# Patient Record
Sex: Male | Born: 1935
Health system: Southern US, Community
[De-identification: ages and names within clinical notes are randomized; demographics above are authoritative.]

## PROBLEM LIST (undated history)

## (undated) DIAGNOSIS — I517 Cardiomegaly: Secondary | ICD-10-CM

## (undated) DIAGNOSIS — I10 Essential (primary) hypertension: Secondary | ICD-10-CM

## (undated) DIAGNOSIS — N138 Other obstructive and reflux uropathy: Secondary | ICD-10-CM

## (undated) DIAGNOSIS — Z7902 Long term (current) use of antithrombotics/antiplatelets: Secondary | ICD-10-CM

## (undated) DIAGNOSIS — N401 Enlarged prostate with lower urinary tract symptoms: Secondary | ICD-10-CM

## (undated) DIAGNOSIS — M353 Polymyalgia rheumatica: Secondary | ICD-10-CM

## (undated) DIAGNOSIS — I82409 Acute embolism and thrombosis of unspecified deep veins of unspecified lower extremity: Secondary | ICD-10-CM

## (undated) DIAGNOSIS — I2510 Atherosclerotic heart disease of native coronary artery without angina pectoris: Secondary | ICD-10-CM

## (undated) DIAGNOSIS — N400 Enlarged prostate without lower urinary tract symptoms: Secondary | ICD-10-CM

## (undated) DIAGNOSIS — T8859XA Other complications of anesthesia, initial encounter: Secondary | ICD-10-CM

## (undated) DIAGNOSIS — K4090 Unilateral inguinal hernia, without obstruction or gangrene, not specified as recurrent: Secondary | ICD-10-CM

## (undated) DIAGNOSIS — D510 Vitamin B12 deficiency anemia due to intrinsic factor deficiency: Secondary | ICD-10-CM

## (undated) DIAGNOSIS — I255 Ischemic cardiomyopathy: Secondary | ICD-10-CM

## (undated) DIAGNOSIS — M1990 Unspecified osteoarthritis, unspecified site: Secondary | ICD-10-CM

## (undated) DIAGNOSIS — I5020 Unspecified systolic (congestive) heart failure: Secondary | ICD-10-CM

## (undated) DIAGNOSIS — I452 Bifascicular block: Secondary | ICD-10-CM

## (undated) DIAGNOSIS — E785 Hyperlipidemia, unspecified: Secondary | ICD-10-CM

## (undated) DIAGNOSIS — R310 Gross hematuria: Secondary | ICD-10-CM

## (undated) DIAGNOSIS — T4145XA Adverse effect of unspecified anesthetic, initial encounter: Secondary | ICD-10-CM

## (undated) DIAGNOSIS — T884XXA Failed or difficult intubation, initial encounter: Secondary | ICD-10-CM

## (undated) DIAGNOSIS — I700 Atherosclerosis of aorta: Secondary | ICD-10-CM

## (undated) HISTORY — DX: Acute embolism and thrombosis of unspecified deep veins of unspecified lower extremity: I82.409

## (undated) HISTORY — DX: Vitamin B12 deficiency anemia due to intrinsic factor deficiency: D51.0

## (undated) HISTORY — DX: Essential (primary) hypertension: I10

## (undated) HISTORY — PX: CORONARY ANGIOPLASTY WITH STENT PLACEMENT: SHX49

## (undated) HISTORY — DX: Atherosclerotic heart disease of native coronary artery without angina pectoris: I25.10

## (undated) HISTORY — DX: Unspecified systolic (congestive) heart failure: I50.20

## (undated) HISTORY — DX: Unspecified osteoarthritis, unspecified site: M19.90

## (undated) HISTORY — DX: Ischemic cardiomyopathy: I25.5

## (undated) HISTORY — DX: Benign prostatic hyperplasia without lower urinary tract symptoms: N40.0

## (undated) HISTORY — PX: JOINT REPLACEMENT: SHX530

---

## 1999-11-16 ENCOUNTER — Encounter (INDEPENDENT_AMBULATORY_CARE_PROVIDER_SITE_OTHER): Payer: Self-pay | Admitting: Internal Medicine

## 1999-11-16 LAB — CONVERTED CEMR LAB: PSA: 1.3 ng/mL

## 2003-01-09 ENCOUNTER — Encounter: Admission: RE | Admit: 2003-01-09 | Discharge: 2003-01-09 | Payer: Self-pay | Admitting: Internal Medicine

## 2003-01-09 ENCOUNTER — Encounter: Payer: Self-pay | Admitting: Internal Medicine

## 2003-01-21 LAB — FECAL OCCULT BLOOD, GUAIAC: Fecal Occult Blood: NEGATIVE

## 2003-08-03 HISTORY — PX: FRACTURE SURGERY: SHX138

## 2004-06-23 ENCOUNTER — Ambulatory Visit: Payer: Self-pay | Admitting: Internal Medicine

## 2004-07-08 ENCOUNTER — Inpatient Hospital Stay (HOSPITAL_COMMUNITY): Admission: AC | Admit: 2004-07-08 | Discharge: 2004-07-18 | Payer: Self-pay

## 2004-07-08 ENCOUNTER — Ambulatory Visit: Payer: Self-pay | Admitting: Physical Medicine & Rehabilitation

## 2004-07-22 ENCOUNTER — Encounter: Admission: RE | Admit: 2004-07-22 | Discharge: 2004-10-20 | Payer: Self-pay | Admitting: General Surgery

## 2004-07-24 ENCOUNTER — Emergency Department (HOSPITAL_COMMUNITY): Admission: EM | Admit: 2004-07-24 | Discharge: 2004-07-24 | Payer: Self-pay | Admitting: Emergency Medicine

## 2004-07-29 ENCOUNTER — Encounter: Admission: RE | Admit: 2004-07-29 | Discharge: 2004-07-29 | Payer: Self-pay | Admitting: Specialist

## 2004-08-05 ENCOUNTER — Ambulatory Visit: Payer: Self-pay | Admitting: Internal Medicine

## 2004-08-10 ENCOUNTER — Ambulatory Visit (HOSPITAL_COMMUNITY): Admission: RE | Admit: 2004-08-10 | Discharge: 2004-08-10 | Payer: Self-pay | Admitting: Specialist

## 2004-08-14 ENCOUNTER — Ambulatory Visit: Payer: Self-pay | Admitting: Internal Medicine

## 2004-08-17 ENCOUNTER — Ambulatory Visit: Payer: Self-pay | Admitting: Internal Medicine

## 2004-08-24 ENCOUNTER — Ambulatory Visit: Payer: Self-pay | Admitting: Internal Medicine

## 2004-08-26 ENCOUNTER — Ambulatory Visit: Payer: Self-pay | Admitting: Internal Medicine

## 2004-08-31 ENCOUNTER — Ambulatory Visit: Payer: Self-pay | Admitting: Internal Medicine

## 2004-09-02 ENCOUNTER — Ambulatory Visit: Payer: Self-pay | Admitting: Family Medicine

## 2004-09-08 ENCOUNTER — Ambulatory Visit: Payer: Self-pay | Admitting: Internal Medicine

## 2004-10-07 ENCOUNTER — Ambulatory Visit: Payer: Self-pay | Admitting: Internal Medicine

## 2004-11-03 ENCOUNTER — Ambulatory Visit: Payer: Self-pay | Admitting: Internal Medicine

## 2004-12-01 ENCOUNTER — Ambulatory Visit: Payer: Self-pay | Admitting: Internal Medicine

## 2005-01-11 ENCOUNTER — Ambulatory Visit: Payer: Self-pay | Admitting: Internal Medicine

## 2005-02-11 ENCOUNTER — Ambulatory Visit: Payer: Self-pay | Admitting: Family Medicine

## 2005-03-17 ENCOUNTER — Ambulatory Visit: Payer: Self-pay | Admitting: Internal Medicine

## 2005-04-20 ENCOUNTER — Ambulatory Visit: Payer: Self-pay | Admitting: Family Medicine

## 2005-05-21 ENCOUNTER — Ambulatory Visit: Payer: Self-pay | Admitting: Family Medicine

## 2005-06-09 ENCOUNTER — Ambulatory Visit: Payer: Self-pay | Admitting: Internal Medicine

## 2005-06-22 ENCOUNTER — Ambulatory Visit: Payer: Self-pay | Admitting: Family Medicine

## 2005-07-23 ENCOUNTER — Ambulatory Visit: Payer: Self-pay | Admitting: Family Medicine

## 2005-08-24 ENCOUNTER — Ambulatory Visit: Payer: Self-pay | Admitting: Internal Medicine

## 2005-09-22 ENCOUNTER — Encounter (INDEPENDENT_AMBULATORY_CARE_PROVIDER_SITE_OTHER): Payer: Self-pay | Admitting: *Deleted

## 2005-09-22 ENCOUNTER — Ambulatory Visit (HOSPITAL_BASED_OUTPATIENT_CLINIC_OR_DEPARTMENT_OTHER): Admission: RE | Admit: 2005-09-22 | Discharge: 2005-09-23 | Payer: Self-pay | Admitting: Orthopedic Surgery

## 2005-09-28 ENCOUNTER — Ambulatory Visit: Payer: Self-pay | Admitting: Family Medicine

## 2005-10-28 ENCOUNTER — Ambulatory Visit: Payer: Self-pay | Admitting: Family Medicine

## 2005-11-30 ENCOUNTER — Ambulatory Visit: Payer: Self-pay | Admitting: Family Medicine

## 2006-01-06 ENCOUNTER — Ambulatory Visit: Payer: Self-pay | Admitting: Family Medicine

## 2006-02-07 ENCOUNTER — Ambulatory Visit: Payer: Self-pay | Admitting: Family Medicine

## 2006-03-11 ENCOUNTER — Ambulatory Visit: Payer: Self-pay | Admitting: Family Medicine

## 2006-04-22 ENCOUNTER — Ambulatory Visit: Payer: Self-pay | Admitting: Internal Medicine

## 2006-04-27 ENCOUNTER — Ambulatory Visit: Payer: Self-pay | Admitting: Internal Medicine

## 2006-05-24 ENCOUNTER — Ambulatory Visit: Payer: Self-pay | Admitting: Internal Medicine

## 2006-06-08 ENCOUNTER — Ambulatory Visit: Payer: Self-pay | Admitting: Family Medicine

## 2006-07-20 ENCOUNTER — Ambulatory Visit: Payer: Self-pay | Admitting: Internal Medicine

## 2006-08-24 ENCOUNTER — Ambulatory Visit: Payer: Self-pay | Admitting: Internal Medicine

## 2006-08-30 ENCOUNTER — Ambulatory Visit: Payer: Self-pay

## 2006-09-02 HISTORY — PX: TOTAL KNEE ARTHROPLASTY: SHX125

## 2006-09-20 ENCOUNTER — Inpatient Hospital Stay (HOSPITAL_COMMUNITY): Admission: RE | Admit: 2006-09-20 | Discharge: 2006-09-26 | Payer: Self-pay | Admitting: Specialist

## 2006-09-21 ENCOUNTER — Ambulatory Visit: Payer: Self-pay | Admitting: Physical Medicine & Rehabilitation

## 2006-10-04 ENCOUNTER — Ambulatory Visit: Payer: Self-pay | Admitting: Internal Medicine

## 2006-11-02 ENCOUNTER — Encounter (INDEPENDENT_AMBULATORY_CARE_PROVIDER_SITE_OTHER): Payer: Self-pay | Admitting: Internal Medicine

## 2006-11-02 DIAGNOSIS — I10 Essential (primary) hypertension: Secondary | ICD-10-CM | POA: Insufficient documentation

## 2006-11-02 DIAGNOSIS — Z86718 Personal history of other venous thrombosis and embolism: Secondary | ICD-10-CM | POA: Insufficient documentation

## 2006-11-02 DIAGNOSIS — M159 Polyosteoarthritis, unspecified: Secondary | ICD-10-CM | POA: Insufficient documentation

## 2006-11-02 DIAGNOSIS — D510 Vitamin B12 deficiency anemia due to intrinsic factor deficiency: Secondary | ICD-10-CM

## 2006-11-16 ENCOUNTER — Ambulatory Visit: Payer: Self-pay | Admitting: Family Medicine

## 2006-12-10 ENCOUNTER — Inpatient Hospital Stay (HOSPITAL_COMMUNITY): Admission: EM | Admit: 2006-12-10 | Discharge: 2006-12-14 | Payer: Self-pay | Admitting: Emergency Medicine

## 2006-12-10 ENCOUNTER — Encounter: Payer: Self-pay | Admitting: Internal Medicine

## 2006-12-19 ENCOUNTER — Ambulatory Visit: Payer: Self-pay | Admitting: Internal Medicine

## 2007-01-03 ENCOUNTER — Encounter (INDEPENDENT_AMBULATORY_CARE_PROVIDER_SITE_OTHER): Payer: Self-pay | Admitting: *Deleted

## 2007-01-04 ENCOUNTER — Ambulatory Visit: Payer: Self-pay | Admitting: Internal Medicine

## 2007-01-04 DIAGNOSIS — H9190 Unspecified hearing loss, unspecified ear: Secondary | ICD-10-CM | POA: Insufficient documentation

## 2007-01-19 ENCOUNTER — Ambulatory Visit: Payer: Self-pay | Admitting: Internal Medicine

## 2007-02-20 ENCOUNTER — Ambulatory Visit: Payer: Self-pay | Admitting: Internal Medicine

## 2007-04-13 ENCOUNTER — Ambulatory Visit: Payer: Self-pay | Admitting: Internal Medicine

## 2007-06-02 ENCOUNTER — Ambulatory Visit: Payer: Self-pay | Admitting: Internal Medicine

## 2007-07-03 ENCOUNTER — Ambulatory Visit: Payer: Self-pay | Admitting: Internal Medicine

## 2007-07-11 ENCOUNTER — Ambulatory Visit: Payer: Self-pay | Admitting: Internal Medicine

## 2007-07-11 DIAGNOSIS — N401 Enlarged prostate with lower urinary tract symptoms: Secondary | ICD-10-CM

## 2007-07-11 DIAGNOSIS — N138 Other obstructive and reflux uropathy: Secondary | ICD-10-CM

## 2007-07-12 LAB — CONVERTED CEMR LAB
Basophils Absolute: 0 10*3/uL (ref 0.0–0.1)
Calcium: 9.1 mg/dL (ref 8.4–10.5)
Chloride: 100 meq/L (ref 96–112)
GFR calc Af Amer: 123 mL/min
GFR calc non Af Amer: 102 mL/min
Glucose, Bld: 94 mg/dL (ref 70–99)
HCT: 38.2 % — ABNORMAL LOW (ref 39.0–52.0)
MCHC: 33.8 g/dL (ref 30.0–36.0)
Neutrophils Relative %: 68.5 % (ref 43.0–77.0)
RBC: 4.34 M/uL (ref 4.22–5.81)
RDW: 14.2 % (ref 11.5–14.6)
TSH: 1.51 microintl units/mL (ref 0.35–5.50)
WBC: 6.2 10*3/uL (ref 4.5–10.5)

## 2007-07-25 ENCOUNTER — Ambulatory Visit: Payer: Self-pay | Admitting: Gastroenterology

## 2007-08-07 ENCOUNTER — Ambulatory Visit: Payer: Self-pay | Admitting: Internal Medicine

## 2007-08-08 ENCOUNTER — Encounter: Payer: Self-pay | Admitting: Internal Medicine

## 2007-08-08 ENCOUNTER — Ambulatory Visit: Payer: Self-pay | Admitting: Gastroenterology

## 2007-08-16 ENCOUNTER — Ambulatory Visit: Payer: Self-pay | Admitting: Internal Medicine

## 2007-09-03 HISTORY — PX: TOTAL KNEE ARTHROPLASTY: SHX125

## 2007-09-07 ENCOUNTER — Ambulatory Visit: Payer: Self-pay | Admitting: Family Medicine

## 2007-09-12 ENCOUNTER — Ambulatory Visit: Payer: Self-pay | Admitting: Internal Medicine

## 2007-09-13 LAB — CONVERTED CEMR LAB: PSA: 2.47 ng/mL (ref 0.10–4.00)

## 2007-09-22 ENCOUNTER — Inpatient Hospital Stay (HOSPITAL_COMMUNITY): Admission: RE | Admit: 2007-09-22 | Discharge: 2007-09-27 | Payer: Self-pay | Admitting: Specialist

## 2007-09-25 ENCOUNTER — Ambulatory Visit: Payer: Self-pay | Admitting: Physical Medicine & Rehabilitation

## 2007-09-26 ENCOUNTER — Encounter (INDEPENDENT_AMBULATORY_CARE_PROVIDER_SITE_OTHER): Payer: Self-pay | Admitting: Specialist

## 2007-09-26 ENCOUNTER — Ambulatory Visit: Payer: Self-pay | Admitting: Surgery

## 2007-10-04 ENCOUNTER — Emergency Department (HOSPITAL_COMMUNITY): Admission: EM | Admit: 2007-10-04 | Discharge: 2007-10-04 | Payer: Self-pay | Admitting: Emergency Medicine

## 2007-10-05 ENCOUNTER — Telehealth (INDEPENDENT_AMBULATORY_CARE_PROVIDER_SITE_OTHER): Payer: Self-pay | Admitting: *Deleted

## 2007-10-09 ENCOUNTER — Ambulatory Visit: Payer: Self-pay | Admitting: Internal Medicine

## 2007-10-11 ENCOUNTER — Ambulatory Visit: Payer: Self-pay | Admitting: Internal Medicine

## 2007-10-12 ENCOUNTER — Telehealth: Payer: Self-pay | Admitting: Internal Medicine

## 2007-10-17 ENCOUNTER — Ambulatory Visit: Payer: Self-pay | Admitting: Internal Medicine

## 2007-10-18 ENCOUNTER — Telehealth (INDEPENDENT_AMBULATORY_CARE_PROVIDER_SITE_OTHER): Payer: Self-pay | Admitting: *Deleted

## 2007-10-24 ENCOUNTER — Ambulatory Visit: Payer: Self-pay | Admitting: Internal Medicine

## 2007-11-09 ENCOUNTER — Ambulatory Visit: Payer: Self-pay | Admitting: Internal Medicine

## 2007-11-29 ENCOUNTER — Encounter: Payer: Self-pay | Admitting: Internal Medicine

## 2007-12-12 ENCOUNTER — Ambulatory Visit: Payer: Self-pay | Admitting: Internal Medicine

## 2008-01-10 ENCOUNTER — Ambulatory Visit: Payer: Self-pay | Admitting: Internal Medicine

## 2008-01-17 ENCOUNTER — Ambulatory Visit: Payer: Self-pay | Admitting: Internal Medicine

## 2008-02-12 ENCOUNTER — Ambulatory Visit (HOSPITAL_COMMUNITY): Admission: RE | Admit: 2008-02-12 | Discharge: 2008-02-12 | Payer: Self-pay | Admitting: Specialist

## 2008-02-20 ENCOUNTER — Ambulatory Visit: Payer: Self-pay | Admitting: Internal Medicine

## 2008-03-22 ENCOUNTER — Ambulatory Visit: Payer: Self-pay | Admitting: Internal Medicine

## 2008-04-23 ENCOUNTER — Ambulatory Visit: Payer: Self-pay | Admitting: Family Medicine

## 2008-04-29 ENCOUNTER — Ambulatory Visit: Payer: Self-pay | Admitting: Family Medicine

## 2008-05-24 ENCOUNTER — Ambulatory Visit: Payer: Self-pay | Admitting: Internal Medicine

## 2008-06-24 ENCOUNTER — Ambulatory Visit: Payer: Self-pay | Admitting: Internal Medicine

## 2008-07-12 ENCOUNTER — Ambulatory Visit: Payer: Self-pay | Admitting: Internal Medicine

## 2008-07-16 LAB — CONVERTED CEMR LAB
Basophils Absolute: 0 10*3/uL (ref 0.0–0.1)
CO2: 30 meq/L (ref 19–32)
GFR calc non Af Amer: 118 mL/min
Glucose, Bld: 76 mg/dL (ref 70–99)
HCT: 36.5 % — ABNORMAL LOW (ref 39.0–52.0)
Hemoglobin: 12.4 g/dL — ABNORMAL LOW (ref 13.0–17.0)
MCHC: 34.1 g/dL (ref 30.0–36.0)
MCV: 86.4 fL (ref 78.0–100.0)
Monocytes Absolute: 0.4 10*3/uL (ref 0.1–1.0)
Neutro Abs: 2.7 10*3/uL (ref 1.4–7.7)
Phosphorus: 2.8 mg/dL (ref 2.3–4.6)
Potassium: 4.3 meq/L (ref 3.5–5.1)
RDW: 14.1 % (ref 11.5–14.6)
Sodium: 138 meq/L (ref 135–145)
TSH: 2.37 microintl units/mL (ref 0.35–5.50)
Total Bilirubin: 0.5 mg/dL (ref 0.3–1.2)

## 2008-07-25 ENCOUNTER — Ambulatory Visit: Payer: Self-pay | Admitting: Internal Medicine

## 2008-08-19 ENCOUNTER — Telehealth: Payer: Self-pay | Admitting: Internal Medicine

## 2008-08-30 ENCOUNTER — Ambulatory Visit: Payer: Self-pay | Admitting: Internal Medicine

## 2008-09-12 ENCOUNTER — Encounter (INDEPENDENT_AMBULATORY_CARE_PROVIDER_SITE_OTHER): Payer: Self-pay | Admitting: *Deleted

## 2008-09-30 ENCOUNTER — Ambulatory Visit: Payer: Self-pay | Admitting: Internal Medicine

## 2008-10-31 ENCOUNTER — Ambulatory Visit: Payer: Self-pay | Admitting: Internal Medicine

## 2008-12-03 ENCOUNTER — Ambulatory Visit: Payer: Self-pay | Admitting: Internal Medicine

## 2009-01-03 ENCOUNTER — Ambulatory Visit: Payer: Self-pay | Admitting: Internal Medicine

## 2009-01-16 ENCOUNTER — Ambulatory Visit: Payer: Self-pay | Admitting: Internal Medicine

## 2009-02-04 ENCOUNTER — Ambulatory Visit: Payer: Self-pay | Admitting: Internal Medicine

## 2009-03-07 ENCOUNTER — Ambulatory Visit: Payer: Self-pay | Admitting: Internal Medicine

## 2009-03-07 ENCOUNTER — Telehealth: Payer: Self-pay | Admitting: Internal Medicine

## 2009-03-07 LAB — CONVERTED CEMR LAB
AST: 22 units/L (ref 0–37)
Alkaline Phosphatase: 99 units/L (ref 39–117)
Total Bilirubin: 0.5 mg/dL (ref 0.3–1.2)

## 2009-04-08 ENCOUNTER — Ambulatory Visit: Payer: Self-pay | Admitting: Internal Medicine

## 2009-05-09 ENCOUNTER — Ambulatory Visit: Payer: Self-pay | Admitting: Internal Medicine

## 2009-06-10 ENCOUNTER — Ambulatory Visit: Payer: Self-pay | Admitting: Internal Medicine

## 2009-07-10 ENCOUNTER — Ambulatory Visit: Payer: Self-pay | Admitting: Internal Medicine

## 2009-07-12 ENCOUNTER — Encounter: Payer: Self-pay | Admitting: Internal Medicine

## 2009-07-12 ENCOUNTER — Emergency Department (HOSPITAL_COMMUNITY): Admission: EM | Admit: 2009-07-12 | Discharge: 2009-07-12 | Payer: Self-pay | Admitting: Emergency Medicine

## 2009-07-16 ENCOUNTER — Ambulatory Visit: Payer: Self-pay | Admitting: Internal Medicine

## 2009-07-18 LAB — CONVERTED CEMR LAB
ALT: 41 units/L (ref 0–53)
AST: 50 units/L — ABNORMAL HIGH (ref 0–37)
Basophils Relative: 0.6 % (ref 0.0–3.0)
CO2: 29 meq/L (ref 19–32)
Calcium: 9.3 mg/dL (ref 8.4–10.5)
Chloride: 100 meq/L (ref 96–112)
Creatinine, Ser: 0.7 mg/dL (ref 0.4–1.5)
Eosinophils Relative: 3.1 % (ref 0.0–5.0)
HCT: 38.2 % — ABNORMAL LOW (ref 39.0–52.0)
Hemoglobin: 12.8 g/dL — ABNORMAL LOW (ref 13.0–17.0)
Lymphs Abs: 2 10*3/uL (ref 0.7–4.0)
MCV: 90.6 fL (ref 78.0–100.0)
Monocytes Absolute: 0.8 10*3/uL (ref 0.1–1.0)
Monocytes Relative: 9.2 % (ref 3.0–12.0)
Neutro Abs: 5.6 10*3/uL (ref 1.4–7.7)
Platelets: 194 10*3/uL (ref 150.0–400.0)
Sodium: 136 meq/L (ref 135–145)
TSH: 1.29 microintl units/mL (ref 0.35–5.50)
Total Bilirubin: 0.8 mg/dL (ref 0.3–1.2)
Total Protein: 7.1 g/dL (ref 6.0–8.3)
WBC: 8.8 10*3/uL (ref 4.5–10.5)

## 2009-08-14 ENCOUNTER — Ambulatory Visit: Payer: Self-pay | Admitting: Internal Medicine

## 2009-09-16 ENCOUNTER — Ambulatory Visit: Payer: Self-pay | Admitting: Internal Medicine

## 2009-10-14 ENCOUNTER — Ambulatory Visit: Payer: Self-pay | Admitting: Internal Medicine

## 2009-11-14 ENCOUNTER — Ambulatory Visit: Payer: Self-pay | Admitting: Internal Medicine

## 2009-12-16 ENCOUNTER — Ambulatory Visit: Payer: Self-pay | Admitting: Internal Medicine

## 2010-01-16 ENCOUNTER — Ambulatory Visit: Payer: Self-pay | Admitting: Internal Medicine

## 2010-01-19 ENCOUNTER — Ambulatory Visit: Payer: Self-pay | Admitting: Internal Medicine

## 2010-02-17 ENCOUNTER — Ambulatory Visit: Payer: Self-pay | Admitting: Internal Medicine

## 2010-02-27 ENCOUNTER — Encounter: Payer: Self-pay | Admitting: Internal Medicine

## 2010-03-20 ENCOUNTER — Telehealth: Payer: Self-pay | Admitting: Internal Medicine

## 2010-03-20 ENCOUNTER — Ambulatory Visit: Payer: Self-pay | Admitting: Internal Medicine

## 2010-04-20 ENCOUNTER — Ambulatory Visit: Payer: Self-pay | Admitting: Internal Medicine

## 2010-05-20 ENCOUNTER — Ambulatory Visit: Payer: Self-pay | Admitting: Internal Medicine

## 2010-06-23 ENCOUNTER — Ambulatory Visit: Payer: Self-pay | Admitting: Internal Medicine

## 2010-07-20 ENCOUNTER — Ambulatory Visit: Payer: Self-pay | Admitting: Internal Medicine

## 2010-07-21 LAB — CONVERTED CEMR LAB
ALT: 17 units/L (ref 0–53)
Alkaline Phosphatase: 93 units/L (ref 39–117)
Basophils Relative: 0.6 % (ref 0.0–3.0)
Bilirubin, Direct: 0.1 mg/dL (ref 0.0–0.3)
Creatinine, Ser: 0.8 mg/dL (ref 0.4–1.5)
Eosinophils Absolute: 0.2 10*3/uL (ref 0.0–0.7)
Eosinophils Relative: 2.6 % (ref 0.0–5.0)
Glucose, Bld: 91 mg/dL (ref 70–99)
Lymphocytes Relative: 24.6 % (ref 12.0–46.0)
Monocytes Absolute: 0.5 10*3/uL (ref 0.1–1.0)
Neutrophils Relative %: 65 % (ref 43.0–77.0)
Phosphorus: 2.4 mg/dL (ref 2.3–4.6)
Platelets: 189 10*3/uL (ref 150.0–400.0)
Potassium: 4.1 meq/L (ref 3.5–5.1)
RBC: 4.36 M/uL (ref 4.22–5.81)
Sodium: 141 meq/L (ref 135–145)
Total Bilirubin: 0.4 mg/dL (ref 0.3–1.2)
Total Protein: 6.7 g/dL (ref 6.0–8.3)
WBC: 6.2 10*3/uL (ref 4.5–10.5)

## 2010-07-23 ENCOUNTER — Ambulatory Visit: Payer: Self-pay | Admitting: Internal Medicine

## 2010-08-25 ENCOUNTER — Ambulatory Visit
Admission: RE | Admit: 2010-08-25 | Discharge: 2010-08-25 | Payer: Self-pay | Source: Home / Self Care | Attending: Internal Medicine | Admitting: Internal Medicine

## 2010-08-30 LAB — CONVERTED CEMR LAB
Albumin: 3.9 g/dL (ref 3.5–5.2)
BUN: 17 mg/dL (ref 6–23)
Basophils Absolute: 0.1 10*3/uL (ref 0.0–0.1)
Calcium: 9.3 mg/dL (ref 8.4–10.5)
Chloride: 103 meq/L (ref 96–112)
GFR calc Af Amer: 123 mL/min
GFR calc non Af Amer: 102 mL/min
MCHC: 35.2 g/dL (ref 30.0–36.0)
Monocytes Relative: 6.7 % (ref 3.0–11.0)
Platelets: 199 10*3/uL (ref 150–400)
RBC: 4.55 M/uL (ref 4.22–5.81)
RDW: 13.1 % (ref 11.5–14.6)
Sodium: 138 meq/L (ref 135–145)

## 2010-09-03 NOTE — Assessment & Plan Note (Signed)
Summary: B12/Dionel Archey/DLO   Nurse Visit   Allergies: No Known Drug Allergies  Medication Administration  Injection # 1:    Medication: Vit B12 1000 mcg    Diagnosis: ANEMIA, PERNICIOUS (ICD-281.0)    Route: IM    Site: L deltoid    Exp Date: 01/31/2012    Lot #: [id number]    Mfr: [employer] Regent    Patient tolerated injection without complications    Given by: Mervin Hack CMA [facility] Dull) (July 23, 2010 8:45 AM)  Orders Added: 1)  Vit B12 1000 mcg [J3420] 2)  Admin of Therapeutic Inj  intramuscular or subcutaneous [96372]   Medication Administration  Injection # 1:    Medication: Vit B12 1000 mcg    Diagnosis: ANEMIA, PERNICIOUS (ICD-281.0)    Route: IM    Site: L deltoid    Exp Date: 01/31/2012    Lot #: [id number]    Mfr: [employer] Regent    Patient tolerated injection without complications    Given by: Mervin Hack CMA [facility] Dull) (July 23, 2010 8:45 AM)  Orders Added: 1)  Vit B12 1000 mcg [J3420] 2)  Admin of Therapeutic Inj  intramuscular or subcutaneous [16109]

## 2010-09-03 NOTE — Assessment & Plan Note (Signed)
Summary: B12/DLO   Nurse Visit   Allergies: No Known Drug Allergies  Medication Administration  Injection # 1:    Medication: Vit B12 1000 mcg    Diagnosis: ANEMIA, PERNICIOUS (ICD-281.0)    Route: IM    Site: R deltoid    Exp Date: 01/31/2012    Lot #: [id number]    Mfr: [employer] Regent    Patient tolerated injection without complications    Given by: Benny Lennert CMA [facility] Dull) (June 23, 2010 8:37 AM)  Orders Added: 1)  Vit B12 1000 mcg [J3420] 2)  Admin of Therapeutic Inj  intramuscular or subcutaneous [16109]

## 2010-09-03 NOTE — Letter (Signed)
Summary: Letter Regarding Refills by Jacques Navy MD  Letter Regarding Refills by Jacques Navy MD   Imported By: Lanelle Bal 03/25/2010 12:46:21  _____________________________________________________________________  External Attachment:    Type:   Image     Comment:   External Document

## 2010-09-03 NOTE — Assessment & Plan Note (Signed)
Summary: B12/DLO   Nurse Visit     Allergies: No Known Drug Allergies     Medication Administration  Injection # 1:    Medication: Vit B12 1000 mcg    Diagnosis: ANEMIA, PERNICIOUS (ICD-281.0)    Route: IM    Site: R deltoid    Exp Date: 05/12/2009    Lot #: [id number]    Mfr: American Regent    Patient tolerated injection without complications    Given by: Mervin Hack CMA (February 04, 2009 10:05 AM)  Orders Added: 1)  Vit B12 1000 mcg [J3420] 2)  Admin of Therapeutic Inj  intramuscular or subcutaneous [96372]        Medication Administration  Injection # 1:    Medication: Vit B12 1000 mcg    Diagnosis: ANEMIA, PERNICIOUS (ICD-281.0)    Route: IM    Site: R deltoid    Exp Date: 05/12/2009    Lot #: [id number]    Mfr: [employer] Regent    Patient tolerated injection without complications    Given by: Mervin Hack CMA (February 04, 2009 10:05 AM)  Orders Added: 1)  Vit B12 1000 mcg [J3420] 2)  Admin of Therapeutic Inj  intramuscular or subcutaneous [[id number]]

## 2010-09-03 NOTE — Assessment & Plan Note (Signed)
Summary: [name] B12/RBH   Nurse Visit   Allergies: No Known Drug Allergies  Medication Administration  Injection # 1:    Medication: Vit B12 1000 mcg    Diagnosis: ANEMIA, PERNICIOUS (ICD-281.0)    Route: IM    Site: R deltoid    Exp Date: 12/01/2010    Lot #: [id number]    Mfr: American Regent    Patient tolerated injection without complications    Given by: Mervin Hack CMA (AAMA) (August 14, 2009 10:52 AM)  Orders Added: 1)  Vit B12 1000 mcg [J3420] 2)  Admin of Therapeutic Inj  intramuscular or subcutaneous [96372]   Medication Administration  Injection # 1:    Medication: Vit B12 1000 mcg    Diagnosis: ANEMIA, PERNICIOUS (ICD-281.0)    Route: IM    Site: R deltoid    Exp Date: 12/01/2010    Lot #: [id number]    Mfr: [employer] Regent    Patient tolerated injection without complications    Given by: Mervin Hack CMA (AAMA) (August 14, 2009 10:52 AM)  Orders Added: 1)  Vit B12 1000 mcg [J3420] 2)  Admin of Therapeutic Inj  intramuscular or subcutaneous [[id number]]

## 2010-09-03 NOTE — Assessment & Plan Note (Signed)
Summary: b12/dlo   Nurse Visit   Allergies: No Known Drug Allergies  Medication Administration  Injection # 1:    Medication: Vit B12 1000 mcg    Diagnosis: ANEMIA, PERNICIOUS (ICD-281.0)    Route: IM    Site: L deltoid    Exp Date: 08/02/2011    Lot #: [id number]    Mfr: [employer] Regent    Patient tolerated injection without complications    Given by: Delilah Shan CMA (AAMA) (January 16, 2010 10:07 AM)  Orders Added: 1)  Admin of Therapeutic Inj  intramuscular or subcutaneous [96372] 2)  Vit B12 1000 mcg [J3420]

## 2010-09-03 NOTE — Assessment & Plan Note (Signed)
Summary: B12 SHOT / LFW   Nurse Visit   Allergies: No Known Drug Allergies  Medication Administration  Injection # 1:    Medication: Vit B12 1000 mcg    Diagnosis: ANEMIA, PERNICIOUS (ICD-281.0)    Route: IM    Site: L deltoid    Exp Date: 12/01/2010    Lot #: [id number]    Mfr: [employer] Regent    Patient tolerated injection without complications    Given by: Mervin Hack CMA [facility]) (November 14, 2009 10:16 AM)  Orders Added: 1)  Vit B12 1000 mcg [J3420] 2)  Admin of Therapeutic Inj  intramuscular or subcutaneous [96372]   Medication Administration  Injection # 1:    Medication: Vit B12 1000 mcg    Diagnosis: ANEMIA, PERNICIOUS (ICD-281.0)    Route: IM    Site: L deltoid    Exp Date: 12/01/2010    Lot #: [id number]    Mfr: [employer] Regent    Patient tolerated injection without complications    Given by: Mervin Hack CMA (AAMA) (November 14, 2009 10:16 AM)  Orders Added: 1)  Vit B12 1000 mcg [J3420] 2)  Admin of Therapeutic Inj  intramuscular or subcutaneous [[id number]]

## 2010-09-03 NOTE — Assessment & Plan Note (Signed)
Summary: B12/DLO   Nurse Visit   Allergies: No Known Drug Allergies  Medication Administration  Injection # 1:    Medication: Vit B12 1000 mcg    Diagnosis: ANEMIA, PERNICIOUS (ICD-281.0)    Route: IM    Site: R deltoid    Exp Date: 05/02/2012    Lot #: [id number]    Mfr: [employer] Regent    Patient tolerated injection without complications    Given by: Sydell Axon LPN (August 25, 2010 9:23 AM)  Orders Added: 1)  Vit B12 1000 mcg [J3420] 2)  Admin of Therapeutic Inj  intramuscular or subcutaneous [96372]   Medication Administration  Injection # 1:    Medication: Vit B12 1000 mcg    Diagnosis: ANEMIA, PERNICIOUS (ICD-281.0)    Route: IM    Site: R deltoid    Exp Date: 05/02/2012    Lot #: [id number]    Mfr: [employer] Regent    Patient tolerated injection without complications    Given by: Sydell Axon LPN (August 25, 2010 9:23 AM)  Orders Added: 1)  Vit B12 1000 mcg [J3420] 2)  Admin of Therapeutic Inj  intramuscular or subcutaneous [16109]
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
## 2010-09-03 NOTE — Assessment & Plan Note (Signed)
Summary: B12/DLO   Nurse Visit   Allergies: No Known Drug Allergies  Medication Administration  Injection # 1:    Medication: Vit B12 1000 mcg    Diagnosis: ANEMIA, PERNICIOUS (ICD-281.0)    Route: IM    Site: L deltoid    Exp Date: 05/03/2011    Lot #: [id number]    Mfr: American Regent    Patient tolerated injection without complications    Given by: Linde Gillis CMA [facility] Dull) (September 16, 2009 10:17 AM)  Orders Added: 1)  Vit B12 1000 mcg [J3420] 2)  Admin of Therapeutic Inj  intramuscular or subcutaneous [96045]

## 2010-09-03 NOTE — Assessment & Plan Note (Signed)
Summary: B-12/LETVAK/JRR   Nurse Visit   Allergies: No Known Drug Allergies  Medication Administration  Injection # 1:    Medication: Vit B12 1000 mcg    Diagnosis: ANEMIA, PERNICIOUS (ICD-281.0)    Route: IM    Site: L deltoid    Exp Date: 11/30/2011    Lot #: [id number]    Mfr: American Regent    Patient tolerated injection without complications    Given by: Delilah Shan CMA [facility] Dull) (May 20, 2010 8:46 AM)  Orders Added: 1)  Admin of Therapeutic Inj  intramuscular or subcutaneous [96372] 2)  Vit B12 1000 mcg [J3420]   Medication Administration  Injection # 1:    Medication: Vit B12 1000 mcg    Diagnosis: ANEMIA, PERNICIOUS (ICD-281.0)    Route: IM    Site: L deltoid    Exp Date: 11/30/2011    Lot #: [id number]    Mfr: American Regent    Patient tolerated injection without complications    Given by: Delilah Shan CMA (AAMA) (May 20, 2010 8:46 AM)  Orders Added: 1)  Admin of Therapeutic Inj  intramuscular or subcutaneous [96372] 2)  Vit B12 1000 mcg [J3420]
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
## 2010-09-03 NOTE — Assessment & Plan Note (Signed)
Summary: b12/dlo   Nurse Visit   Allergies: No Known Drug Allergies  Medication Administration  Injection # 1:    Medication: Vit B12 1000 mcg    Diagnosis: ANEMIA, PERNICIOUS (ICD-281.0)    Route: IM    Site: R deltoid    Exp Date: 06/03/2011    Lot #: [id number]    Mfr: American Regent    Patient tolerated injection without complications    Given by: Linde Gillis CMA (AAMA) (October 14, 2009 10:08 AM)  Orders Added: 1)  Vit B12 1000 mcg [J3420] 2)  Admin of Therapeutic Inj  intramuscular or subcutaneous [96045]

## 2010-09-03 NOTE — Assessment & Plan Note (Signed)
Summary: B12/DLO   Nurse Visit   Allergies: No Known Drug Allergies  Medication Administration  Injection # 1:    Medication: Vit B12 1000 mcg    Diagnosis: ANEMIA, PERNICIOUS (ICD-281.0)    Route: IM    Site: R deltoid    Exp Date: 06/03/2011    Lot #: [id number]    Mfr: American Regent    Patient tolerated injection without complications    Given by: Linde Gillis CMA Duncan Dull) (Dec 16, 2009 11:08 AM)  Orders Added: 1)  Vit B12 1000 mcg [J3420] 2)  Admin of Therapeutic Inj  intramuscular or subcutaneous [96372]  Prior Medications: CARDURA 4 MG TABS (DOXAZOSIN MESYLATE) Take 1 tablet by mouth once a day LISINOPRIL 10 MG TABS (LISINOPRIL) Take 1 tablet by mouth once a day OXYBUTYNIN CHLORIDE 5 MG TABS (OXYBUTYNIN CHLORIDE) Take 1 tablet by mouth two times a day CYANOCOBALAMIN 1000 MCG/ML SOLN (CYANOCOBALAMIN) monthly MULTIVITAMINS  TABS (MULTIPLE VITAMIN) Take 1 tablet by mouth once a day BAYER LOW STRENGTH 81 MG  TBEC (ASPIRIN) 1 daily by mouth DICLOFENAC SODIUM 75 MG TBEC (DICLOFENAC SODIUM) 1 tab two times a day for arthritis HYDROCODONE-ACETAMINOPHEN 5-500 MG TABS (HYDROCODONE-ACETAMINOPHEN) 1-2 tabs at bedtime for arthritis pain AMOXICILLIN-POT CLAVULANATE 875-125 MG TABS (AMOXICILLIN-POT CLAVULANATE) take 1 by mouth two times a day SULFAMETHOXAZOLE-TMP DS 800-160 MG TABS (SULFAMETHOXAZOLE-TRIMETHOPRIM) take 1 by mouth two times a day x10days OXYCODONE-ACETAMINOPHEN 5-325 MG TABS (OXYCODONE-ACETAMINOPHEN) take 1-2 by mouth every 4-6hours as needed pain Current Allergies: No known allergies

## 2010-09-03 NOTE — Assessment & Plan Note (Signed)
Summary: LETVAK B12/RBH   Nurse Visit   Allergies: No Known Drug Allergies  Medication Administration  Injection # 1:    Medication: Vit B12 1000 mcg    Diagnosis: ANEMIA, PERNICIOUS (ICD-281.0)    Route: IM    Site: R deltoid    Exp Date: 07/03/2011    Lot #: [id number]    Mfr: American Regent    Patient tolerated injection without complications    Given by: Sydell Axon LPN (March 20, 2010 10:02 AM)  Orders Added: 1)  Vit B12 1000 mcg [J3420] 2)  Admin of Therapeutic Inj  intramuscular or subcutaneous [96372]     Medication Administration  Injection # 1:    Medication: Vit B12 1000 mcg    Diagnosis: ANEMIA, PERNICIOUS (ICD-281.0)    Route: IM    Site: R deltoid    Exp Date: 07/03/2011    Lot #: [id number]    Mfr: American Regent    Patient tolerated injection without complications    Given by: Sydell Axon LPN (March 20, 2010 10:02 AM)  Orders Added: 1)  Vit B12 1000 mcg [J3420] 2)  Admin of Therapeutic Inj  intramuscular or subcutaneous [19147]

## 2010-09-03 NOTE — Progress Notes (Signed)
Summary: Letter and refills  Phone Note Call from Patient   Caller: Patient Call For: Cindee Salt MD Summary of Call: Patient came to office for B-12 injection. Patient dropped a letter from Dr. Otelia Sergeant that he would like Dr. Alphonsus Sias to read. Copy made and put in your in box. Patient is having a problem with [facility] and they will not refill his meds until a bill is paid which patient does not feel that he should owe. Patient would like rx's sent to [facility] for a 30 day supply of Oxybutynin, Lisinopril and Doxazosin with refills because he will have to pay for them until he gets this cleared up with [facility]. Patient stated that he has been out of his medicaion for 3 months because of this issue. Initial call taken by: Sydell Axon LPN,  March 20, 2010 10:07 AM  Follow-up for Phone Call        okay to send refills on those meds for 1 month x 3 refills  I reviewed the letter Follow-up by: Cindee Salt MD,  March 20, 2010 1:05 PM  Additional Follow-up for Phone Call Additional follow up Details #1::        refills sent to pharmacy, also advised pt's wife about refills. Additional Follow-up by: Mervin Hack CMA Duncan Dull),  March 20, 2010 2:48 PM    Prescriptions: CARDURA 4 MG TABS (DOXAZOSIN MESYLATE) Take 1 tablet by mouth once a day  #30 x 3   Entered by:   Mervin Hack CMA (AAMA)   Authorized by:   Cindee Salt MD   Signed by:   Mervin Hack CMA (AAMA) on 03/20/2010   Method used:   Electronically to        [facility] [address] [phone]* (retail)       [address] [place], [place]  [id number]       Ph: [phone]       Fax: [phone]   RxID:   [id number] LISINOPRIL 10 MG TABS (LISINOPRIL) Take 1 tablet by mouth once a day  #30 x 3   Entered by:   Mervin Hack CMA (AAMA)   Authorized by:   Cindee Salt MD   Signed by:   Mervin Hack CMA (AAMA) on 03/20/2010   Method used:   Electronically to        [facility] [address]  [phone]* (retail)       [address] [place], [place]  [id number]       Ph: [phone]       Fax: [phone]   RxID:   [id number] OXYBUTYNIN CHLORIDE 5 MG TABS (OXYBUTYNIN CHLORIDE) Take 1 tablet by mouth two times a day  #60 x 3   Entered by:   Mervin Hack CMA (AAMA)   Authorized by:   Cindee Salt MD   Signed by:   Mervin Hack CMA (AAMA) on 03/20/2010   Method used:   Electronically to        [facility] [address] [phone]* (retail)       [address] [place], [place]  [id number]       Ph: [phone]       Fax: [phone]   RxID:   [id number]

## 2010-09-03 NOTE — Assessment & Plan Note (Signed)
Summary: B-12   Nurse Visit   Allergies: No Known Drug Allergies  Medication Administration  Injection # 1:    Medication: Vit B12 1000 mcg    Diagnosis: ANEMIA, PERNICIOUS (ICD-281.0)    Route: IM    Site: L deltoid    Exp Date: 07/2011    Lot #: [id number]    Mfr: American Regent    Patient tolerated injection without complications    Given by: Lowella Petties CMA (February 17, 2010 1:15 PM)  Orders Added: 1)  Vit B12 1000 mcg [J3420] 2)  Admin of Therapeutic Inj  intramuscular or subcutaneous [96372]   Medication Administration  Injection # 1:    Medication: Vit B12 1000 mcg    Diagnosis: ANEMIA, PERNICIOUS (ICD-281.0)    Route: IM    Site: L deltoid    Exp Date: 07/2011    Lot #: [id number]    Mfr: American Regent    Patient tolerated injection without complications    Given by: Lowella Petties CMA (February 17, 2010 1:15 PM)  Orders Added: 1)  Vit B12 1000 mcg [J3420] 2)  Admin of Therapeutic Inj  intramuscular or subcutaneous [54098]

## 2010-09-25 ENCOUNTER — Encounter: Payer: Self-pay | Admitting: Internal Medicine

## 2010-09-25 ENCOUNTER — Ambulatory Visit (INDEPENDENT_AMBULATORY_CARE_PROVIDER_SITE_OTHER): Payer: Medicare Other

## 2010-09-25 DIAGNOSIS — D510 Vitamin B12 deficiency anemia due to intrinsic factor deficiency: Secondary | ICD-10-CM

## 2010-09-29 NOTE — Assessment & Plan Note (Signed)
Summary: b/12 jrt   Nurse Visit   Allergies: No Known Drug Allergies  Medication Administration  Injection # 1:    Medication: Vit B12 1000 mcg    Diagnosis: ANEMIA, PERNICIOUS (ICD-281.0)    Route: IM    Site: L deltoid    Exp Date: 03/01/2012    Lot #: [id number]    Mfr: American Regent    Patient tolerated injection without complications    Given by: Delilah Shan CMA (AAMA) (September 25, 2010 9:15 AM)  Orders Added: 1)  Admin of Therapeutic Inj  intramuscular or subcutaneous [96372] 2)  Vit B12 1000 mcg [J3420]   Medication Administration  Injection # 1:    Medication: Vit B12 1000 mcg    Diagnosis: ANEMIA, PERNICIOUS (ICD-281.0)    Route: IM    Site: L deltoid    Exp Date: 03/01/2012    Lot #: [id number]    Mfr: American Regent    Patient tolerated injection without complications    Given by: Delilah Shan CMA (AAMA) (September 25, 2010 9:15 AM)  Orders Added: 1)  Admin of Therapeutic Inj  intramuscular or subcutaneous [96372] 2)  Vit B12 1000 mcg [J3420]

## 2010-10-27 ENCOUNTER — Ambulatory Visit (INDEPENDENT_AMBULATORY_CARE_PROVIDER_SITE_OTHER): Payer: Medicare Other | Admitting: Internal Medicine

## 2010-10-27 DIAGNOSIS — D649 Anemia, unspecified: Secondary | ICD-10-CM

## 2010-10-27 MED ORDER — CYANOCOBALAMIN 1000 MCG/ML IJ SOLN
1000.0000 ug | INTRAMUSCULAR | Status: AC
  Administered 2010-10-27 – 2011-04-02 (×5): 1000 ug via INTRAMUSCULAR

## 2010-10-27 NOTE — Progress Notes (Signed)
  Subjective:    Patient ID: [name], male    DOB: 14-Mar-1935, 76 y.o.   MRN: [id number]  HPI Here for B12 injection   Review of Systems     Objective:   Physical Exam        Assessment & Plan:

## 2010-11-02 ENCOUNTER — Other Ambulatory Visit: Payer: Self-pay | Admitting: Internal Medicine

## 2010-11-24 ENCOUNTER — Ambulatory Visit: Payer: Medicare Other

## 2010-11-27 ENCOUNTER — Ambulatory Visit (INDEPENDENT_AMBULATORY_CARE_PROVIDER_SITE_OTHER): Payer: Medicare Other | Admitting: Family Medicine

## 2010-11-27 DIAGNOSIS — D510 Vitamin B12 deficiency anemia due to intrinsic factor deficiency: Secondary | ICD-10-CM

## 2010-11-27 DIAGNOSIS — D649 Anemia, unspecified: Secondary | ICD-10-CM

## 2010-11-27 NOTE — Progress Notes (Signed)
  Subjective:    Patient ID: [name], male    DOB: 11-04-1934, 76 y.o.   MRN: [id number]  HPI H/o pernicious anemia, here for B12 injection.   Review of Systems     Objective:   Physical Exam        Assessment & Plan:

## 2010-12-02 ENCOUNTER — Other Ambulatory Visit: Payer: Self-pay | Admitting: Internal Medicine

## 2010-12-04 ENCOUNTER — Encounter: Payer: Self-pay | Admitting: Internal Medicine

## 2010-12-04 ENCOUNTER — Ambulatory Visit (INDEPENDENT_AMBULATORY_CARE_PROVIDER_SITE_OTHER): Payer: Medicare Other | Admitting: Internal Medicine

## 2010-12-04 VITALS — BP 127/55 | HR 64 | Temp 98.4°F | Ht 72.0 in | Wt 201.8 lb

## 2010-12-04 DIAGNOSIS — W57XXXA Bitten or stung by nonvenomous insect and other nonvenomous arthropods, initial encounter: Secondary | ICD-10-CM

## 2010-12-04 DIAGNOSIS — T148 Other injury of unspecified body region: Secondary | ICD-10-CM
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
## 2010-12-04 NOTE — Patient Instructions (Addendum)
Please call if there is more redness around the tick site
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
## 2010-12-15 NOTE — Discharge Summary (Signed)
NAMEGRESHAM, [name]                ACCOUNT NO.:  [id number]   MEDICAL RECORD NO.:  [id number]          PATIENT TYPE:  INP   LOCATION:  5031                         FACILITY:  [facility]   PHYSICIAN:  Kerrin Champagne, M.D.   DATE OF BIRTH:  1935-04-11   DATE OF ADMISSION:  09/20/2006  DATE OF DISCHARGE:  09/26/2006                               DISCHARGE SUMMARY   ADMISSION DIAGNOSES:  1. Severe tricompartmental osteoarthritis right knee with flexion      deformity and varus deformity.  2. Hypertension.  3. Iron deficiency anemia.  4. BPH.  5. History of right lower extremity deep venous thrombosis.  6. History of gout.   DISCHARGE DIAGNOSES:  1. Severe tricompartmental osteoarthritis right knee with flexion      deformity and varus deformity.  2. Hypertension.  3. Iron deficiency anemia.  4. BPH.  5. History of right lower extremity deep venous thrombosis.  6. History of gout.  7. Post-hemorrhagic anemia.  8. Post-operative fever, resolved at discharge.   PROCEDURE:  Right cemented total knee arthroplasty, computer-assisted,  performed by Dr. Otelia Sergeant, assisted by Maud Deed [facility] under general  anesthesia.   CONSULTATIONS:  Physical medicine and rehabilitation, Dr. Hermelinda Medicus.   BRIEF HISTORY:  The patient is a 76 year old male with history of severe  osteoarthritis of the right knee.  He has failed conservative management  including viscous supplementation, physical therapy, intra-articular  steroid injections.  He is now requiring anti-inflammatories and  narcotic medications for his relief.  His right knee was noted to have  severe varus deformity, as well as a flexion contracture.  Radiographs  with severe tricompartmental osteoarthritis.  It was felt he would  benefit from total knee arthroplasty and was admitted for admitted for  this procedure.   BRIEF HOSPITAL COURSE:  The patient tolerated the procedure under  general anesthesia without complications.  Postoperatively,  he was  started on physical therapy for range of motion strengthening,  exercises, and gait training.  He was allowed weightbearing as tolerated  on the operative extremity.  The patient utilizes CPM machine, as well  as active range of motion.  He progressed fairly well throughout the  hospital stay.  Rehab consult was obtained, bit it was felt he was not a  necessary candidate, as he did have good home support and was  progressing well with physical therapy.  At the time of discharge, the  patient was ambulating greater than 200 feet and had range of motion  with full extension and flexion to approximately 90 degrees.  Occupational therapy assisted with ADLs, and the patient tolerated this  well.  The patient was placed on Coumadin for DVT prophylaxis.  Adjustments in Coumadin dose were made, according to daily pro-times by  the pharmacist.  The patient did have events of low grade fever on the  third and fourth and fifth postoperative days.  Eventually, he was  afebrile.  Urinalysis showed no urinary tract infection, and chest x-ray  with no acute change.  The examination of his knee showed him to have a  mild effusion, however,  stable appearance.  No obvious signs of  infection.  The patient eventually was afebrile for a full 24 hours and  at that time was felt to be stable for discharge home.  Daily dressing  changes revealed the wound to be healing well without erythema, edema or  drainage.   PERTINENT LABORATORY VALUES:  Admission hemoglobin and hematocrit 13.4  and 39.7 respectively.  At discharge, hemoglobin 9.4 and hematocrit  27.2.  At discharge, INR 1.8.  Chemistry studies on admission within  normal limits, with the exception of BUN 24.  Repeat during the hospital  stay showed stable BMET.  One episode of hyponatremia at 132.  Urinalysis negative for urinary tract infection on September 23, 2006.   Chest x-ray on admission with chronic changes, no acute chest finding  and  repeat on February22, stable chronic changes with no acute chest  disease.   PLAN:  The patient was discharged to his home with arrangements for home  health physical therapy, occupational therapy, durable medical equipment  and Coumadin management.  He was encouraged to do active and passive  range of motion of the knee.  He will continue to be weightbearing as  tolerated on the operative extremity.  He may change his dressing on as  needed basis, and his wound will be allowed to be wet.  He was given a  prescription for hydrocodone 5/325, one to two every 4 to 6 hours as  needed for pain and Coumadin 5 mg 2 tablets daily at 6:00 p.m.  He was  advised to follow up with Dr. Otelia Sergeant two weeks from the date of surgery.  All questions encouraged and answered prior to discharge.   CONDITION ON DISCHARGE:  Stable.      Wende Neighbors, P.A.      Kerrin Champagne, M.D.  Electronically Signed    SMV/MEDQ  D:  06/01/2007  T:  06/02/2007  Job:  [id number]

## 2010-12-15 NOTE — Discharge Summary (Signed)
[name], [name]                ACCOUNT NO.:  [id number]   MEDICAL RECORD NO.:  [id number]          PATIENT TYPE:  INP   LOCATION:  5018                         FACILITY:  [facility]   PHYSICIAN:  Kerrin Champagne, M.D.   DATE OF BIRTH:  1935-06-13   DATE OF ADMISSION:  09/22/2007  DATE OF DISCHARGE:  09/27/2007                               DISCHARGE SUMMARY   ADMISSION DIAGNOSES:  1. Tricompartmental osteoarthritis of the left knee.  2. Status post right total knee arthroplasty in February 2007.  3. History of deep venous thrombosis.  4. Iron deficiency anemia.  5. Hypertension.  6. Benign prostatic hypertrophy.  7. History of gout.   DISCHARGE DIAGNOSES:  1. Tricompartmental osteoarthritis of the left knee.  2. Status post right total knee arthroplasty February of 2007.  3. History of deep venous thrombosis.  4. Iron deficiency anemia.  5. Hypertension.  6. Benign prostatic hypertrophy.  7. History of gout.  8. Posthemorrhagic anemia.  9. Postoperative diarrhea, resolved at discharge.  10.Postoperative hyponatremia and hypokalemia, stable at discharge.   PROCEDURE:  On September 22, 2007, the patient underwent left total knee  arthroplasty, computer assisted, performed by Dr. Otelia Sergeant assisted by  Maud Deed, PA-C, under general anesthesia.   CONSULTATIONS:  Dr. Riley Kill of Physical Medicine and Rehabilitation.   BRIEF HISTORY:  The patient is a 76 year old male, who is status post  right total knee arthroplasty approximately 2 years prior to this  admission.  He has had persistent pain and dysfunction of the left knee.  He now has restriction of his functions and ability to stand and walk  secondary to the left knee pain.  Radiographs have indicated  tricompartmental osteoarthritis of the left knee with bone-on-bone  medially and large osteophytes over the distal femur and over the  lateral aspect and superior aspect of the patella.  It was felt that he  will benefit from  surgical intervention in the form of a left total knee  arthroplasty and was admitted for this procedure.   BRIEF HOSPITAL COURSE:  The patient tolerated the procedure under  general anesthesia without complications.  He was placed on Coumadin  postoperatively for DVT prophylaxis with adjustments made in doses by  the pharmacist of [facility] according to daily pro-times.  On the  first postoperative day, Hemovac drain was discontinued.  His wound was  healing well throughout the remainder of the hospital stay.  No active  drainage during the hospital stay.  The patient utilized CPM machine for  range of motion.  The patient also received physical therapy for  ambulation and gait training, weightbearing as tolerated.  A knee  immobilizer was utilized for ambulation to the left knee.  He also  received active range of motion, stretching, and strengthening  exercises.  The patient was a little slow to advance with his physical  therapy as he was having difficulty with constipation and also developed  hyponatremia and hypokalemia during the hospital stay.  Both of these  were treated with supplementations to his fluids; they were eventually  stable.  He is  also having some low-grade temperatures.  These seemed to  spike mostly in the evening hours.  He underwent venous Doppler studies  to rule out DVT.  These studies were negative for DVT.  Blood culture  and urine culture both were negative.  His stools were sent for C.  difficile toxin which was also negative.  By the fourth postoperative  day, he was feeling some better.  He was able to ambulate as much as 200  feet with a rolling walker.  On September 27, 2007, he was afebrile.  Vital signs were stable.  The patient was taking a regular diet.  He was  voiding without difficulty and having bowel movements.  He was felt  stable for discharge to his home.   PERTINENT LABORATORY VALUES:  CBC on admission with hemoglobin 12.8 and   hematocrit 38.9.  At discharge hemoglobin 8.3 and hematocrit 24.3.  Stool guaiacs were negative.  Coagulation studies on admission, within  normal limits.  INR of 1.8 at discharge with pro-time 21.7.  Sodium  dropped to lowest value of 130 and at discharge was 134.  Potassium  dropped as low as 2.7 and at discharge was 3.1.  Calcium value lowest  today at 8.2.  Urine and blood cultures both negative.  C. difficile  toxin negative.  Chest x-ray on September 26, 2007, cardiomegaly without  active disease.  EKG on admission, normal sinus rhythm, no previous EKG  is available for comparison.   PLAN:  The patient was discharged  to his home.  Arrangements made for  home health physical therapy and occupational therapy.  He will continue  to ambulate weightbearing as tolerated utilizing a walker.  Knee  immobilizer to be used when out of bed and ambulating.  He will keep his  dressing dry and clean for 1 week and then he will be allowed to shower  if there continues to be no wound drainage.  He will continue on an  exercise program of range of motion, stretching, and strengthening  exercises of the left knee.  The patient will follow up with Dr. Otelia Sergeant  in 2 weeks from the date of surgery and has been advised to call to  arrange the appointment.   MEDICATIONS AT DISCHARGE:  The patient will continue his home medication  with the exception of Arthrotec, Vicodin, baby aspirin, and  indomethacin.  He will hold these medications until he is off Coumadin.  He was given a prescription for Coumadin, which will be dosed by the  home health agency pharmacy.  Hydrocodone 7.5/325, #60, 1-2 every 4-6  hours as needed for pain; Robaxin 500 mg, #90, 1 every 8 hours as needed  for spasm; Trinsicon #100, 1 p.o. daily; and K-Dur 20 mEq 1 p.o. daily.  The patient is advised to call the office, should he have questions or  concerns prior to his return office visit.  All questions encouraged and  answered.    CONDITION ON DISCHARGE:  Stable.      Wende Neighbors, P.A.      Kerrin Champagne, M.D.  Electronically Signed    SMV/MEDQ  D:  10/27/2007  T:  10/28/2007  Job:  [id number]

## 2010-12-18 NOTE — Op Note (Signed)
[name], [name]                ACCOUNT NO.:  [id number]   MEDICAL RECORD NO.:  [id number]          PATIENT TYPE:  AMB   LOCATION:  DSC                          FACILITY:  [facility]   PHYSICIAN:  Artist Pais. Weingold, M.D.DATE OF BIRTH:  September 15, 1934   DATE OF PROCEDURE:  09/22/2005  DATE OF DISCHARGE:                                 OPERATIVE REPORT   PREOPERATIVE DIAGNOSIS:  Right proximal ulnar nonunion.   POSTOPERATIVE DIAGNOSIS:  Right proximal ulnar nonunion.   OPERATION PERFORMED:  Takedown of nonunion, right proximal ulna with  culturing, Vitoss bone grafting and double compression plate fixation.   SURGEON:  Artist Pais. Mina Marble, M.D.   ASSISTANT:  Cindee Salt, M.D.   ANESTHESIA:  General.   TOURNIQUET TIME:  Two hours.   COMPLICATIONS:  None.   DRAINS:  None.   DESCRIPTION OF PROCEDURE:  The patient was taken to the operating room.  After the induction of adequate general anesthesia, the right upper  extremity was prepped and draped in the usual sterile fashion.  An Esmarch  was used to exsanguinate the limb, tourniquet was inflated to 250 mmHg.  At  this point, using the previous incision from his original fixation for a  Monteggia fracture dislocation, the skin was incised.  Dissection was  carried down to the subcutaneous border of the ulna.  There was a large  amount of hypertrophic callus surrounding the nonunion site.  This was  carefully identified and then taken down using curets, osteotomes and  rongeurs. This was all sent for culturing of the soft tissues and bone  including superficial, intermediate and deep layers.  After the nonunion  site was taken down and debrided to normal looking tissue, a 9-hole  compression plate was placed on the lateral side fixed proximally first and  then after this was fixed proximally, was placed into compression distally  across the fracture site after the implantation of Vitoss bone grafting.  The Vitoss bone graft was  obtained after a 5 mL bone marrow aspirate from  the right iliac crest.  The graft was placed into the nonunion site,  compression was applied to the plate.  Intraoperative fluoroscopy showed  good reduction in both reduction in both the AP and lateral plane.  A second  plate was then placed where the initial plate was placed from the initial  surgery.  This was a 10-hole DCP plate.  The original plate and screws were  removed after the nonunion site was identified.  After this was all  completed, intraoperative fluoroscopy showed good reduction in both AP and  lateral plane with good placement of  the bone graft.  It was then closed in layers with 2-0 Vicryl and staples on  the skin.  Xeroform, 4 x 4s, and a compressive wrap and a sugar tong splint  was applied.  The patient tolerated the procedure well and went to recovery  room in stable fashion.      Artist Pais Mina Marble, M.D.  Electronically Signed     MAW/MEDQ  D:  09/22/2005  T:  09/23/2005  Job:  [id number] 
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
## 2010-12-31 ENCOUNTER — Ambulatory Visit (INDEPENDENT_AMBULATORY_CARE_PROVIDER_SITE_OTHER): Payer: Medicare Other | Admitting: Internal Medicine

## 2010-12-31 ENCOUNTER — Other Ambulatory Visit: Payer: Self-pay | Admitting: *Deleted

## 2010-12-31 DIAGNOSIS — D510 Vitamin B12 deficiency anemia due to intrinsic factor deficiency: Secondary | ICD-10-CM

## 2010-12-31 DIAGNOSIS — E538 Deficiency of other specified B group vitamins: Secondary | ICD-10-CM

## 2010-12-31 MED ORDER — DOXAZOSIN MESYLATE 4 MG PO TABS: 4.0000 mg | ORAL_TABLET | Freq: Every day | ORAL | Status: DC

## 2010-12-31 MED ORDER — DICLOFENAC SODIUM 75 MG PO TBEC: DELAYED_RELEASE_TABLET | ORAL | Status: DC

## 2010-12-31 MED ORDER — LISINOPRIL 10 MG PO TABS: 10.0000 mg | ORAL_TABLET | Freq: Every day | ORAL | Status: DC

## 2010-12-31 MED ORDER — OXYBUTYNIN CHLORIDE 5 MG PO TABS: ORAL_TABLET | ORAL | Status: DC

## 2010-12-31 NOTE — Telephone Encounter (Signed)
Spoke with patient and advised that prescriptions were sent to [facility].

## 2010-12-31 NOTE — Progress Notes (Signed)
Pt received Vit B12 injection in the right arm. Pt tolerated well.

## 2011-01-19 ENCOUNTER — Ambulatory Visit: Payer: Self-pay | Admitting: Internal Medicine

## 2011-01-27 ENCOUNTER — Encounter: Payer: Self-pay | Admitting: Internal Medicine

## 2011-01-29 ENCOUNTER — Encounter: Payer: Self-pay | Admitting: Internal Medicine

## 2011-01-29 ENCOUNTER — Ambulatory Visit: Payer: Medicare Other

## 2011-01-29 ENCOUNTER — Ambulatory Visit (INDEPENDENT_AMBULATORY_CARE_PROVIDER_SITE_OTHER): Payer: Medicare Other | Admitting: Internal Medicine

## 2011-01-29 VITALS — BP 112/60 | HR 57 | Temp 97.9°F | Ht 70.0 in | Wt 204.0 lb

## 2011-01-29 DIAGNOSIS — I10 Essential (primary) hypertension: Secondary | ICD-10-CM

## 2011-01-29 DIAGNOSIS — D510 Vitamin B12 deficiency anemia due to intrinsic factor deficiency: Secondary | ICD-10-CM

## 2011-01-29 DIAGNOSIS — E538 Deficiency of other specified B group vitamins: Secondary | ICD-10-CM

## 2011-01-29 DIAGNOSIS — N400 Enlarged prostate without lower urinary tract symptoms: Secondary | ICD-10-CM

## 2011-01-29 DIAGNOSIS — M1990 Unspecified osteoarthritis, unspecified site: Secondary | ICD-10-CM

## 2011-01-29 NOTE — Assessment & Plan Note (Signed)
Does well with the diclofenac

## 2011-01-29 NOTE — Assessment & Plan Note (Signed)
Continues on B12 injections 

## 2011-01-29 NOTE — Assessment & Plan Note (Signed)
Voids fine on doxazosin Nocturia almost gone with oxybutynin
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
## 2011-01-29 NOTE — Assessment & Plan Note (Signed)
BP Readings from Last 3 Encounters:  01/29/11 112/60  12/04/10 127/55  07/20/10 140/60   Lab Results  Component Value Date   CREATININE 0.8 07/20/2010   Doing well  No changes needed

## 2011-02-12 NOTE — Telephone Encounter (Signed)
Doxazosin called to [facility], they told the patient they didn't get that script from 5/31.

## 2011-03-02 ENCOUNTER — Ambulatory Visit (INDEPENDENT_AMBULATORY_CARE_PROVIDER_SITE_OTHER): Payer: Medicare Other | Admitting: Family Medicine

## 2011-03-02 DIAGNOSIS — D510 Vitamin B12 deficiency anemia due to intrinsic factor deficiency: Secondary | ICD-10-CM

## 2011-03-02 MED ORDER — CYANOCOBALAMIN 1000 MCG/ML IJ SOLN
1000.0000 ug | Freq: Once | INTRAMUSCULAR | Status: AC
  Administered 2011-03-02: 1000 ug via INTRAMUSCULAR

## 2011-03-02 NOTE — Progress Notes (Signed)
B12 injection given during nurse visit today. 

## 2011-04-02 ENCOUNTER — Ambulatory Visit (INDEPENDENT_AMBULATORY_CARE_PROVIDER_SITE_OTHER): Payer: Medicare Other | Admitting: *Deleted

## 2011-04-02 DIAGNOSIS — D510 Vitamin B12 deficiency anemia due to intrinsic factor deficiency: Secondary | ICD-10-CM

## 2011-04-02 DIAGNOSIS — D649 Anemia, unspecified: Secondary | ICD-10-CM

## 2011-04-02 MED ORDER — CYANOCOBALAMIN 1000 MCG/ML IJ SOLN
1000.0000 ug | Freq: Once | INTRAMUSCULAR | Status: AC
  Administered 2011-04-02: 1000 ug via INTRAMUSCULAR

## 2011-04-23 LAB — URINE CULTURE: Special Requests: NEGATIVE

## 2011-04-23 LAB — CBC
HCT: 25.9 — ABNORMAL LOW
HCT: 29.6 — ABNORMAL LOW
Hemoglobin: 10 — ABNORMAL LOW
Hemoglobin: 8.8 — ABNORMAL LOW
Hemoglobin: 8.9 — ABNORMAL LOW
MCHC: 32.9
MCHC: 33.5
MCHC: 33.9
MCV: 87.4
MCV: 87.6
MCV: 87.7
Platelets: 173
RBC: 2.93 — ABNORMAL LOW
RBC: 3.04 — ABNORMAL LOW
RBC: 3.4 — ABNORMAL LOW
RDW: 13.8
RDW: 14.5
RDW: 14.6
WBC: 6.8
WBC: 7.8
WBC: 8.2

## 2011-04-23 LAB — URINALYSIS, ROUTINE W REFLEX MICROSCOPIC
Bilirubin Urine: NEGATIVE
Glucose, UA: NEGATIVE
Hgb urine dipstick: NEGATIVE
Leukocytes, UA: NEGATIVE
Nitrite: NEGATIVE
Specific Gravity, Urine: 1.022
Specific Gravity, Urine: 1.027
Urobilinogen, UA: 1
pH: 5.5
pH: 6

## 2011-04-23 LAB — CLOSTRIDIUM DIFFICILE EIA

## 2011-04-23 LAB — PROTIME-INR
INR: 1
INR: 1.1
INR: 1.5
INR: 1.8 — ABNORMAL HIGH
Prothrombin Time: 18.7 — ABNORMAL HIGH
Prothrombin Time: 21.7 — ABNORMAL HIGH

## 2011-04-23 LAB — BASIC METABOLIC PANEL
BUN: 10
BUN: 9
CO2: 28
Calcium: 8.4
Chloride: 99
Chloride: 99
Creatinine, Ser: 0.51
Creatinine, Ser: 0.67
GFR calc Af Amer: >60
GFR calc Af Amer: >60
GFR calc non Af Amer: >60
GFR calc non Af Amer: >60
GFR calc non Af Amer: >60
GFR calc non Af Amer: >60
Glucose, Bld: 106 — ABNORMAL HIGH
Glucose, Bld: 113 — ABNORMAL HIGH
Potassium: 2.7 — CL
Potassium: 3.1 — ABNORMAL LOW
Potassium: 3.3 — ABNORMAL LOW
Potassium: 3.8
Sodium: 130 — ABNORMAL LOW
Sodium: 131 — ABNORMAL LOW
Sodium: 134 — ABNORMAL LOW
Sodium: 137

## 2011-04-23 LAB — URINE MICROSCOPIC-ADD ON

## 2011-04-23 LAB — DIFFERENTIAL
Basophils Relative: 0
Lymphs Abs: 1.8
Monocytes Absolute: 0.3
Monocytes Relative: 6
Neutro Abs: 3.5

## 2011-04-23 LAB — HEMOGLOBIN AND HEMATOCRIT, BLOOD: Hemoglobin: 8.3 — ABNORMAL LOW

## 2011-04-23 LAB — COMPREHENSIVE METABOLIC PANEL
Albumin: 3.7
Alkaline Phosphatase: 104
BUN: 24 — ABNORMAL HIGH
Calcium: 9.1
Potassium: 4.2
Sodium: 141
Total Protein: 6.4

## 2011-04-23 LAB — CULTURE, BLOOD (ROUTINE X 2)
Culture: NO GROWTH
Culture: NO GROWTH

## 2011-04-23 LAB — OCCULT BLOOD X 1 CARD TO LAB, STOOL
Fecal Occult Bld: NEGATIVE
Fecal Occult Bld: NEGATIVE

## 2011-04-23 LAB — APTT: aPTT: 30

## 2011-04-26 LAB — DIFFERENTIAL
Basophils Absolute: 0
Lymphocytes Relative: 13
Neutro Abs: 7.6
Neutrophils Relative %: 81 — ABNORMAL HIGH

## 2011-04-26 LAB — URINALYSIS, ROUTINE W REFLEX MICROSCOPIC
Hgb urine dipstick: NEGATIVE
Ketones, ur: NEGATIVE
Protein, ur: NEGATIVE
Urobilinogen, UA: 1

## 2011-04-26 LAB — CBC
Platelets: 501 — ABNORMAL HIGH
RDW: 15.1

## 2011-04-26 LAB — SAMPLE TO BLOOD BANK

## 2011-04-26 LAB — PROTIME-INR: Prothrombin Time: 24.3 — ABNORMAL HIGH

## 2011-04-26 LAB — I-STAT 8, (EC8 V) (CONVERTED LAB)
Acid-Base Excess: 2
Chloride: 102
TCO2: 29
pCO2, Ven: 47.2
pH, Ven: 7.377 — ABNORMAL HIGH

## 2011-04-26 LAB — POCT CARDIAC MARKERS
CKMB, poc: <1 — ABNORMAL LOW
Myoglobin, poc: 68.2

## 2011-04-26 LAB — POCT I-STAT CREATININE: Operator id: 295021

## 2011-05-04 ENCOUNTER — Ambulatory Visit: Payer: Medicare Other

## 2011-05-04 ENCOUNTER — Ambulatory Visit (INDEPENDENT_AMBULATORY_CARE_PROVIDER_SITE_OTHER): Payer: Medicare Other | Admitting: *Deleted

## 2011-05-04 DIAGNOSIS — D510 Vitamin B12 deficiency anemia due to intrinsic factor deficiency: Secondary | ICD-10-CM

## 2011-05-04 MED ORDER — CYANOCOBALAMIN 1000 MCG/ML IJ SOLN
1000.0000 ug | Freq: Once | INTRAMUSCULAR | Status: AC
  Administered 2011-05-04: 1000 ug via INTRAMUSCULAR

## 2011-06-04 ENCOUNTER — Ambulatory Visit (INDEPENDENT_AMBULATORY_CARE_PROVIDER_SITE_OTHER): Payer: Medicare Other | Admitting: *Deleted

## 2011-06-04 DIAGNOSIS — D510 Vitamin B12 deficiency anemia due to intrinsic factor deficiency: Secondary | ICD-10-CM

## 2011-06-04 DIAGNOSIS — Z23 Encounter for immunization: Secondary | ICD-10-CM

## 2011-06-04 MED ORDER — CYANOCOBALAMIN 1000 MCG/ML IJ SOLN
1000.0000 ug | Freq: Once | INTRAMUSCULAR | Status: AC
  Administered 2011-06-04: 1000 ug via INTRAMUSCULAR

## 2011-06-04 NOTE — Progress Notes (Signed)
B12 injection and flu shot given today during nurse visit.

## 2011-07-03 HISTORY — PX: ENDOVENOUS ABLATION SAPHENOUS VEIN W/ LASER: SUR449

## 2011-07-06 ENCOUNTER — Ambulatory Visit (INDEPENDENT_AMBULATORY_CARE_PROVIDER_SITE_OTHER): Payer: Medicare Other | Admitting: *Deleted

## 2011-07-06 DIAGNOSIS — E538 Deficiency of other specified B group vitamins: Secondary | ICD-10-CM

## 2011-07-06 MED ORDER — CYANOCOBALAMIN 1000 MCG/ML IJ SOLN
1000.0000 ug | Freq: Once | INTRAMUSCULAR | Status: AC
  Administered 2011-07-06: 1000 ug via INTRAMUSCULAR

## 2011-07-24 ENCOUNTER — Other Ambulatory Visit: Payer: Self-pay | Admitting: Internal Medicine

## 2011-08-04 ENCOUNTER — Encounter: Payer: Medicare Other | Admitting: Internal Medicine

## 2011-08-06 ENCOUNTER — Ambulatory Visit: Payer: Medicare Other

## 2011-08-10 ENCOUNTER — Ambulatory Visit: Payer: Medicare Other

## 2011-08-11 ENCOUNTER — Encounter: Payer: Self-pay | Admitting: Internal Medicine

## 2011-08-11 ENCOUNTER — Ambulatory Visit (INDEPENDENT_AMBULATORY_CARE_PROVIDER_SITE_OTHER): Payer: Medicare Other | Admitting: Internal Medicine

## 2011-08-11 VITALS — BP 130/70 | HR 66 | Temp 97.8°F | Ht 70.0 in | Wt 199.0 lb

## 2011-08-11 DIAGNOSIS — Z0000 Encounter for general adult medical examination without abnormal findings: Secondary | ICD-10-CM | POA: Insufficient documentation

## 2011-08-11 DIAGNOSIS — N400 Enlarged prostate without lower urinary tract symptoms: Secondary | ICD-10-CM | POA: Diagnosis not present

## 2011-08-11 DIAGNOSIS — D510 Vitamin B12 deficiency anemia due to intrinsic factor deficiency: Secondary | ICD-10-CM | POA: Diagnosis not present

## 2011-08-11 DIAGNOSIS — I10 Essential (primary) hypertension: Secondary | ICD-10-CM

## 2011-08-11 DIAGNOSIS — M1990 Unspecified osteoarthritis, unspecified site: Secondary | ICD-10-CM | POA: Diagnosis not present

## 2011-08-11 LAB — TSH: TSH: 0.66 u[IU]/mL (ref 0.35–5.50)

## 2011-08-11 LAB — CBC WITH DIFFERENTIAL/PLATELET
Basophils Absolute: 0.1 10*3/uL (ref 0.0–0.1)
Eosinophils Absolute: 0.4 10*3/uL (ref 0.0–0.7)
Lymphocytes Relative: 27.8 % (ref 12.0–46.0)
MCHC: 33.2 g/dL (ref 30.0–36.0)
Neutrophils Relative %: 57.6 % (ref 43.0–77.0)
Platelets: 183 10*3/uL (ref 150.0–400.0)
RBC: 4.21 Mil/uL — ABNORMAL LOW (ref 4.22–5.81)
RDW: 14.1 % (ref 11.5–14.6)

## 2011-08-11 LAB — BASIC METABOLIC PANEL
BUN: 19 mg/dL (ref 6–23)
CO2: 28 mEq/L (ref 19–32)
Calcium: 9.3 mg/dL (ref 8.4–10.5)
Creatinine, Ser: 0.7 mg/dL (ref 0.4–1.5)
Glucose, Bld: 81 mg/dL (ref 70–99)

## 2011-08-11 LAB — LIPID PANEL
Cholesterol: 184 mg/dL (ref 0–200)
HDL: 47.7 mg/dL (ref >39.00)
LDL Cholesterol: 119 mg/dL — ABNORMAL HIGH (ref 0–99)
Total CHOL/HDL Ratio: 4
Triglycerides: 85 mg/dL (ref 0.0–149.0)

## 2011-08-11 LAB — HEPATIC FUNCTION PANEL
Bilirubin, Direct: 0 mg/dL (ref 0.0–0.3)
Total Bilirubin: 0.4 mg/dL (ref 0.3–1.2)

## 2011-08-11 LAB — VITAMIN B12: Vitamin B-12: >1500 pg/mL — ABNORMAL HIGH (ref 211–911)

## 2011-08-11 MED ORDER — CYANOCOBALAMIN 1000 MCG/ML IJ SOLN
1000.0000 ug | Freq: Once | INTRAMUSCULAR | Status: AC
  Administered 2011-08-11: 1000 ug via INTRAMUSCULAR

## 2011-08-11 MED ORDER — LISINOPRIL 10 MG PO TABS: 10.0000 mg | ORAL_TABLET | Freq: Every day | ORAL | Status: DC

## 2011-08-11 MED ORDER — DICLOFENAC SODIUM 75 MG PO TBEC: DELAYED_RELEASE_TABLET | ORAL | Status: DC

## 2011-08-11 MED ORDER — DOXAZOSIN MESYLATE 4 MG PO TABS: 4.0000 mg | ORAL_TABLET | Freq: Every day | ORAL | Status: DC

## 2011-08-11 MED ORDER — OXYBUTYNIN CHLORIDE 5 MG PO TABS: ORAL_TABLET | ORAL | Status: DC

## 2011-08-11 NOTE — Assessment & Plan Note (Signed)
BP Readings from Last 3 Encounters:  08/11/11 130/70  01/29/11 112/60  12/04/10 127/55   Good control No changes needed Due for labs

## 2011-08-11 NOTE — Assessment & Plan Note (Signed)
Voids well now Nocturia improved on the antispasmodic
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
## 2011-08-11 NOTE — Assessment & Plan Note (Signed)
Has chronic aching,etc On the diclofenac

## 2011-08-11 NOTE — Assessment & Plan Note (Signed)
On vitamin B12 regualrly

## 2011-08-30 ENCOUNTER — Other Ambulatory Visit: Payer: Self-pay | Admitting: Internal Medicine

## 2011-09-16 ENCOUNTER — Ambulatory Visit (INDEPENDENT_AMBULATORY_CARE_PROVIDER_SITE_OTHER): Payer: Medicare Other

## 2011-09-16 DIAGNOSIS — E538 Deficiency of other specified B group vitamins: Secondary | ICD-10-CM

## 2011-09-16 MED ORDER — CYANOCOBALAMIN 1000 MCG/ML IJ SOLN
1000.0000 ug | Freq: Once | INTRAMUSCULAR | Status: AC
  Administered 2011-09-16: 1000 ug via INTRAMUSCULAR

## 2011-10-14 ENCOUNTER — Ambulatory Visit (INDEPENDENT_AMBULATORY_CARE_PROVIDER_SITE_OTHER): Payer: Medicare Other | Admitting: *Deleted

## 2011-10-14 DIAGNOSIS — D649 Anemia, unspecified: Secondary | ICD-10-CM | POA: Diagnosis not present

## 2011-10-14 MED ORDER — CYANOCOBALAMIN 1000 MCG/ML IJ SOLN
1000.0000 ug | Freq: Once | INTRAMUSCULAR | Status: AC
  Administered 2011-10-14: 1000 ug via INTRAMUSCULAR

## 2011-11-18 ENCOUNTER — Ambulatory Visit (INDEPENDENT_AMBULATORY_CARE_PROVIDER_SITE_OTHER): Payer: Medicare Other | Admitting: *Deleted

## 2011-11-18 DIAGNOSIS — D510 Vitamin B12 deficiency anemia due to intrinsic factor deficiency: Secondary | ICD-10-CM

## 2011-11-18 MED ORDER — CYANOCOBALAMIN 1000 MCG/ML IJ SOLN
1000.0000 ug | Freq: Once | INTRAMUSCULAR | Status: AC
  Administered 2011-11-18: 1000 ug via INTRAMUSCULAR

## 2011-12-23 ENCOUNTER — Ambulatory Visit: Payer: Medicare Other

## 2011-12-24 ENCOUNTER — Ambulatory Visit (INDEPENDENT_AMBULATORY_CARE_PROVIDER_SITE_OTHER): Payer: Medicare Other | Admitting: *Deleted

## 2011-12-24 DIAGNOSIS — E538 Deficiency of other specified B group vitamins: Secondary | ICD-10-CM

## 2011-12-24 MED ORDER — CYANOCOBALAMIN 1000 MCG/ML IJ SOLN
1000.0000 ug | Freq: Once | INTRAMUSCULAR | Status: AC
  Administered 2011-12-24: 1000 ug via INTRAMUSCULAR

## 2012-01-27 ENCOUNTER — Ambulatory Visit (INDEPENDENT_AMBULATORY_CARE_PROVIDER_SITE_OTHER): Payer: Medicare Other | Admitting: *Deleted

## 2012-01-27 DIAGNOSIS — D510 Vitamin B12 deficiency anemia due to intrinsic factor deficiency: Secondary | ICD-10-CM | POA: Diagnosis not present

## 2012-01-27 MED ORDER — CYANOCOBALAMIN 1000 MCG/ML IJ SOLN
1000.0000 ug | Freq: Once | INTRAMUSCULAR | Status: AC
  Administered 2012-01-27: 1000 ug via INTRAMUSCULAR

## 2012-02-29 ENCOUNTER — Ambulatory Visit: Payer: Medicare Other

## 2012-03-01 ENCOUNTER — Ambulatory Visit (INDEPENDENT_AMBULATORY_CARE_PROVIDER_SITE_OTHER): Payer: Medicare Other

## 2012-03-01 DIAGNOSIS — E538 Deficiency of other specified B group vitamins: Secondary | ICD-10-CM | POA: Diagnosis not present

## 2012-03-01 DIAGNOSIS — E611 Iron deficiency: Secondary | ICD-10-CM

## 2012-03-01 MED ORDER — CYANOCOBALAMIN 1000 MCG/ML IJ SOLN
1000.0000 ug | Freq: Once | INTRAMUSCULAR | Status: AC
  Administered 2012-03-01: 1000 ug via INTRAMUSCULAR

## 2012-04-04 ENCOUNTER — Ambulatory Visit: Payer: Medicare Other

## 2012-04-04 ENCOUNTER — Ambulatory Visit (INDEPENDENT_AMBULATORY_CARE_PROVIDER_SITE_OTHER): Payer: Medicare Other | Admitting: *Deleted

## 2012-04-04 DIAGNOSIS — D510 Vitamin B12 deficiency anemia due to intrinsic factor deficiency: Secondary | ICD-10-CM

## 2012-04-04 MED ORDER — CYANOCOBALAMIN 1000 MCG/ML IJ SOLN
1000.0000 ug | Freq: Once | INTRAMUSCULAR | Status: AC
  Administered 2012-04-04: 1000 ug via INTRAMUSCULAR

## 2012-04-06 ENCOUNTER — Ambulatory Visit: Payer: Medicare Other

## 2012-04-10 ENCOUNTER — Ambulatory Visit (INDEPENDENT_AMBULATORY_CARE_PROVIDER_SITE_OTHER): Payer: Medicare Other | Admitting: Family Medicine

## 2012-04-10 ENCOUNTER — Encounter: Payer: Self-pay | Admitting: Family Medicine

## 2012-04-10 VITALS — BP 142/74 | HR 58 | Temp 99.0°F | Wt 192.0 lb

## 2012-04-10 DIAGNOSIS — IMO0001 Reserved for inherently not codable concepts without codable children: Secondary | ICD-10-CM

## 2012-04-10 DIAGNOSIS — M791 Myalgia, unspecified site: Secondary | ICD-10-CM

## 2012-04-10 LAB — CBC WITH DIFFERENTIAL/PLATELET
Basophils Absolute: 0 10*3/uL (ref 0.0–0.1)
Eosinophils Absolute: 0.1 10*3/uL (ref 0.0–0.7)
HCT: 39.3 % (ref 39.0–52.0)
Lymphocytes Relative: 17.2 % (ref 12.0–46.0)
Lymphs Abs: 1.3 10*3/uL (ref 0.7–4.0)
MCHC: 32.1 g/dL (ref 30.0–36.0)
Monocytes Relative: 11.6 % (ref 3.0–12.0)
Platelets: 161 10*3/uL (ref 150.0–400.0)
RDW: 15.1 % — ABNORMAL HIGH (ref 11.5–14.6)

## 2012-04-10 LAB — SEDIMENTATION RATE: Sed Rate: 28 mm/hr — ABNORMAL HIGH (ref 0–22)

## 2012-04-10 LAB — TSH: TSH: 0.39 u[IU]/mL (ref 0.35–5.50)

## 2012-04-10 LAB — COMPREHENSIVE METABOLIC PANEL
Albumin: 3.8 g/dL (ref 3.5–5.2)
Alkaline Phosphatase: 75 U/L (ref 39–117)
BUN: 23 mg/dL (ref 6–23)
CO2: 24 mEq/L (ref 19–32)
Calcium: 8.8 mg/dL (ref 8.4–10.5)
Glucose, Bld: 93 mg/dL (ref 70–99)
Sodium: 135 mEq/L (ref 135–145)

## 2012-04-10 MED ORDER — PREDNISONE 20 MG PO TABS: 20.0000 mg | ORAL_TABLET | Freq: Every day | ORAL | Status: AC

## 2012-04-10 NOTE — Progress Notes (Signed)
2-3 weeks of pain.  Pain in thighs, lower legs.  Shoulders and arms hurt.  All progressively worse in last few weeks.  No trauma, no triggers.  Hydrocodone doesn't help, voltaren didn't help at all. "this is unlike anything I've had before."  No tick bites recently.  No fevers.  No rash.  No pain on the torso.  No vision changes or HA.    Meds, vitals, and allergies reviewed.   ROS: See HPI.  Otherwise, noncontributory.  Nad, using walker ncat Mmm rrr ctab abd soft, not ttp Ext w/o edema or erythema.  No TTP on shoulders, elbows, knees or hips but large muscle groups near the trunk are tender

## 2012-04-10 NOTE — Assessment & Plan Note (Signed)
Presumed PMR, d/w pt re: likely dx and pred use.  Stop nsaid in meantime, check cbc/tsh/cmet/ESR.  Pt to call back with update.  We may have to up the steroid, but I wanted to start as low as possible.  Will titrated based on response.  GI caution with routine steroid instructions.  He understood.  Not on statin.  Will have pt fu with PCP in about 2 weeks.

## 2012-04-10 NOTE — Patient Instructions (Addendum)
Go to the lab on the way out.  We'll contact you with your lab report. Stop taking the diclofenac while you are on prednisone.  Take prednisone with food.  Call back with an update on the aches in about 3-4 days.  Schedule a follow up with Dr. Alphonsus Sias in 2 weeks.  It appears that you have PMR, a condition that makes people ache and is treated with a long course (6-12 months) of slowly tapered prednisone.

## 2012-05-04 ENCOUNTER — Ambulatory Visit (INDEPENDENT_AMBULATORY_CARE_PROVIDER_SITE_OTHER): Payer: Medicare Other | Admitting: Internal Medicine

## 2012-05-04 ENCOUNTER — Ambulatory Visit (INDEPENDENT_AMBULATORY_CARE_PROVIDER_SITE_OTHER): Payer: Medicare Other

## 2012-05-04 ENCOUNTER — Encounter: Payer: Self-pay | Admitting: Internal Medicine

## 2012-05-04 ENCOUNTER — Ambulatory Visit (INDEPENDENT_AMBULATORY_CARE_PROVIDER_SITE_OTHER): Payer: Medicare Other | Admitting: *Deleted

## 2012-05-04 VITALS — BP 122/68 | HR 60 | Temp 98.3°F | Wt 193.8 lb

## 2012-05-04 DIAGNOSIS — M791 Myalgia, unspecified site: Secondary | ICD-10-CM

## 2012-05-04 DIAGNOSIS — E538 Deficiency of other specified B group vitamins: Secondary | ICD-10-CM | POA: Diagnosis not present

## 2012-05-04 DIAGNOSIS — IMO0001 Reserved for inherently not codable concepts without codable children: Secondary | ICD-10-CM

## 2012-05-04 DIAGNOSIS — Z23 Encounter for immunization: Secondary | ICD-10-CM | POA: Diagnosis not present

## 2012-05-04 MED ORDER — CYANOCOBALAMIN 1000 MCG/ML IJ SOLN
1000.0000 ug | Freq: Once | INTRAMUSCULAR | Status: AC
  Administered 2012-05-04: 1000 ug via INTRAMUSCULAR

## 2012-05-04 NOTE — Patient Instructions (Signed)
Please reduce the prednisone to 10mg  daily for the next week, then decrease to 10mg  every other day for another week. If you still feel fine, you can stop the prednisone. You can restart the dicolfenac again---use it only 1 at a time and only as needed (but if you have pain, you can take 2 in a day again)

## 2012-05-04 NOTE — Assessment & Plan Note (Signed)
Got better after 1 day May have been related to long car ride ESR only slightly elevated  Will wean off the prednisone and see his response If no recurrence, probably not PMR Okay to add back diclofenac prn
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
## 2012-06-09 ENCOUNTER — Ambulatory Visit (INDEPENDENT_AMBULATORY_CARE_PROVIDER_SITE_OTHER): Payer: Medicare Other | Admitting: *Deleted

## 2012-06-09 DIAGNOSIS — E538 Deficiency of other specified B group vitamins: Secondary | ICD-10-CM | POA: Diagnosis not present

## 2012-06-09 MED ORDER — CYANOCOBALAMIN 1000 MCG/ML IJ SOLN
1000.0000 ug | Freq: Once | INTRAMUSCULAR | Status: AC
  Administered 2012-06-09: 1000 ug via INTRAMUSCULAR

## 2012-07-11 ENCOUNTER — Ambulatory Visit (INDEPENDENT_AMBULATORY_CARE_PROVIDER_SITE_OTHER): Payer: Medicare Other

## 2012-07-11 DIAGNOSIS — E538 Deficiency of other specified B group vitamins: Secondary | ICD-10-CM

## 2012-07-11 MED ORDER — CYANOCOBALAMIN 1000 MCG/ML IJ SOLN
1000.0000 ug | Freq: Once | INTRAMUSCULAR | Status: AC
  Administered 2012-07-11: 1000 ug via INTRAMUSCULAR

## 2012-07-17 ENCOUNTER — Other Ambulatory Visit: Payer: Self-pay | Admitting: *Deleted

## 2012-07-17 MED ORDER — DOXAZOSIN MESYLATE 4 MG PO TABS: 4.0000 mg | ORAL_TABLET | Freq: Every day | ORAL | Status: DC

## 2012-07-17 MED ORDER — DICLOFENAC SODIUM 75 MG PO TBEC: 75.0000 mg | DELAYED_RELEASE_TABLET | Freq: Two times a day (BID) | ORAL | Status: DC | PRN

## 2012-08-11 ENCOUNTER — Ambulatory Visit (INDEPENDENT_AMBULATORY_CARE_PROVIDER_SITE_OTHER): Payer: Medicare Other | Admitting: Family Medicine

## 2012-08-11 DIAGNOSIS — D518 Other vitamin B12 deficiency anemias: Secondary | ICD-10-CM

## 2012-08-11 DIAGNOSIS — D649 Anemia, unspecified: Secondary | ICD-10-CM

## 2012-08-11 DIAGNOSIS — D519 Vitamin B12 deficiency anemia, unspecified: Secondary | ICD-10-CM

## 2012-08-11 MED ORDER — CYANOCOBALAMIN 1000 MCG/ML IJ SOLN
1000.0000 ug | Freq: Once | INTRAMUSCULAR | Status: AC
  Administered 2012-08-11: 1000 ug via INTRAMUSCULAR

## 2012-09-12 ENCOUNTER — Ambulatory Visit (INDEPENDENT_AMBULATORY_CARE_PROVIDER_SITE_OTHER): Payer: Medicare Other | Admitting: Internal Medicine

## 2012-09-12 ENCOUNTER — Encounter: Payer: Self-pay | Admitting: Internal Medicine

## 2012-09-12 VITALS — BP 130/70 | HR 86 | Temp 98.0°F | Ht 70.0 in | Wt 198.0 lb

## 2012-09-12 DIAGNOSIS — N400 Enlarged prostate without lower urinary tract symptoms: Secondary | ICD-10-CM | POA: Diagnosis not present

## 2012-09-12 DIAGNOSIS — I10 Essential (primary) hypertension: Secondary | ICD-10-CM

## 2012-09-12 DIAGNOSIS — D510 Vitamin B12 deficiency anemia due to intrinsic factor deficiency: Secondary | ICD-10-CM

## 2012-09-12 DIAGNOSIS — IMO0001 Reserved for inherently not codable concepts without codable children: Secondary | ICD-10-CM

## 2012-09-12 DIAGNOSIS — M791 Myalgia, unspecified site: Secondary | ICD-10-CM

## 2012-09-12 LAB — SEDIMENTATION RATE: Sed Rate: 11 mm/hr (ref 0–22)

## 2012-09-12 LAB — CBC WITH DIFFERENTIAL/PLATELET
Basophils Absolute: 0.1 10*3/uL (ref 0.0–0.1)
Eosinophils Absolute: 0.4 10*3/uL (ref 0.0–0.7)
Hemoglobin: 12 g/dL — ABNORMAL LOW (ref 13.0–17.0)
Lymphocytes Relative: 32.8 % (ref 12.0–46.0)
MCHC: 32.4 g/dL (ref 30.0–36.0)
Monocytes Absolute: 0.3 10*3/uL (ref 0.1–1.0)
Neutro Abs: 2.4 10*3/uL (ref 1.4–7.7)
RDW: 15.5 % — ABNORMAL HIGH (ref 11.5–14.6)

## 2012-09-12 LAB — BASIC METABOLIC PANEL
BUN: 20 mg/dL (ref 6–23)
GFR: 120.62 mL/min (ref >60.00)
Potassium: 4.4 mEq/L (ref 3.5–5.1)
Sodium: 138 mEq/L (ref 135–145)

## 2012-09-12 MED ORDER — PREDNISONE 10 MG PO TABS: 20.0000 mg | ORAL_TABLET | Freq: Every day | ORAL | Status: DC

## 2012-09-12 MED ORDER — CYANOCOBALAMIN 1000 MCG/ML IJ SOLN
1000.0000 ug | Freq: Once | INTRAMUSCULAR | Status: AC
  Administered 2012-09-12: 1000 ug via INTRAMUSCULAR

## 2012-09-12 NOTE — Assessment & Plan Note (Signed)
Has recurred Need to reconsider PMR Will check labs Empiric prednisone again Recheck soon

## 2012-09-12 NOTE — Patient Instructions (Addendum)
Please start the prednisone again---take 2 daily after eating (can take together) Call if you have any problems with it

## 2012-09-12 NOTE — Assessment & Plan Note (Signed)
Voids okay on the ditropan

## 2012-09-12 NOTE — Assessment & Plan Note (Signed)
BP Readings from Last 3 Encounters:  09/12/12 130/70  05/04/12 122/68  04/10/12 142/74   Good control No changes needed

## 2012-09-12 NOTE — Addendum Note (Signed)
Addended by: Sueanne Margarita on: 09/12/2012 09:49 AM   Modules accepted: Orders
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
## 2012-10-03 ENCOUNTER — Other Ambulatory Visit: Payer: Self-pay | Admitting: *Deleted

## 2012-10-03 MED ORDER — DICLOFENAC SODIUM 75 MG PO TBEC: 75.0000 mg | DELAYED_RELEASE_TABLET | Freq: Two times a day (BID) | ORAL | Status: DC | PRN

## 2012-10-03 MED ORDER — LISINOPRIL 10 MG PO TABS: 10.0000 mg | ORAL_TABLET | Freq: Every day | ORAL | Status: DC

## 2012-10-11 ENCOUNTER — Other Ambulatory Visit: Payer: Self-pay | Admitting: *Deleted

## 2012-10-11 MED ORDER — OXYBUTYNIN CHLORIDE 5 MG PO TABS: ORAL_TABLET | ORAL | Status: DC

## 2012-10-17 ENCOUNTER — Ambulatory Visit (INDEPENDENT_AMBULATORY_CARE_PROVIDER_SITE_OTHER): Payer: Medicare Other | Admitting: Internal Medicine

## 2012-10-17 ENCOUNTER — Encounter: Payer: Self-pay | Admitting: Internal Medicine

## 2012-10-17 VITALS — BP 128/78 | HR 61 | Temp 98.6°F | Wt 201.0 lb

## 2012-10-17 DIAGNOSIS — M353 Polymyalgia rheumatica: Secondary | ICD-10-CM

## 2012-10-17 DIAGNOSIS — D649 Anemia, unspecified: Secondary | ICD-10-CM | POA: Diagnosis not present

## 2012-10-17 MED ORDER — CYANOCOBALAMIN 1000 MCG/ML IJ SOLN
1000.0000 ug | Freq: Once | INTRAMUSCULAR | Status: AC
  Administered 2012-10-17: 1000 ug via INTRAMUSCULAR
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
## 2012-10-17 NOTE — Patient Instructions (Signed)
In 2 weeks, decrease the prednisone to 1 pill on even days and 2 pills on odd days (do this around April 1st). If your muscle pain is still quiet after a month (around May 1st), decrease to 1 pill daily. If your pain recurs, go back to the prior dose.

## 2012-10-17 NOTE — Assessment & Plan Note (Signed)
Symptoms and response to prednisone go along with this though sed rate normal Will have him start weaning prednisone soon Counseled almost all of 15 minute visit

## 2012-11-23 ENCOUNTER — Ambulatory Visit (INDEPENDENT_AMBULATORY_CARE_PROVIDER_SITE_OTHER): Payer: Medicare Other | Admitting: *Deleted

## 2012-11-23 DIAGNOSIS — D510 Vitamin B12 deficiency anemia due to intrinsic factor deficiency: Secondary | ICD-10-CM | POA: Diagnosis not present

## 2012-11-23 MED ORDER — CYANOCOBALAMIN 1000 MCG/ML IJ SOLN
1000.0000 ug | Freq: Once | INTRAMUSCULAR | Status: AC
  Administered 2012-11-23: 1000 ug via INTRAMUSCULAR

## 2012-11-27 ENCOUNTER — Other Ambulatory Visit: Payer: Self-pay | Admitting: *Deleted

## 2012-11-27 MED ORDER — DICLOFENAC SODIUM 75 MG PO TBEC: 75.0000 mg | DELAYED_RELEASE_TABLET | Freq: Two times a day (BID) | ORAL | Status: DC | PRN

## 2012-12-12 NOTE — Telephone Encounter (Signed)
[facility] called re; refill diclofenac; spoke with Ardis Rowan pharmacist did not receive refill sent electronically on 11/27/12. Gave refill diclofenac 75 mg EC tab as instructed verbally.

## 2012-12-28 ENCOUNTER — Other Ambulatory Visit: Payer: Self-pay | Admitting: *Deleted

## 2012-12-28 ENCOUNTER — Ambulatory Visit: Payer: Medicare Other

## 2012-12-28 MED ORDER — DOXAZOSIN MESYLATE 4 MG PO TABS: 4.0000 mg | ORAL_TABLET | Freq: Every day | ORAL | Status: DC

## 2013-01-23 ENCOUNTER — Ambulatory Visit: Payer: Medicare Other | Admitting: Internal Medicine

## 2013-02-06 ENCOUNTER — Ambulatory Visit (INDEPENDENT_AMBULATORY_CARE_PROVIDER_SITE_OTHER): Payer: Medicare Other | Admitting: Internal Medicine

## 2013-02-06 ENCOUNTER — Encounter: Payer: Self-pay | Admitting: Internal Medicine

## 2013-02-06 VITALS — BP 110/60 | HR 54 | Temp 97.8°F | Wt 199.0 lb

## 2013-02-06 DIAGNOSIS — M353 Polymyalgia rheumatica: Secondary | ICD-10-CM

## 2013-02-06 DIAGNOSIS — D510 Vitamin B12 deficiency anemia due to intrinsic factor deficiency: Secondary | ICD-10-CM

## 2013-02-06 LAB — SEDIMENTATION RATE: Sed Rate: 13 mm/hr (ref 0–22)

## 2013-02-06 NOTE — Assessment & Plan Note (Signed)
Has pain when sitting on pelvis (may be from injuries from fall --not PMR) and left shoulder He weaned the prednisone quickly and may be stable even off it Will check sed rate Only restart if more symptoms  Has diclofenac for prn use
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
## 2013-02-07 ENCOUNTER — Encounter: Payer: Self-pay | Admitting: *Deleted

## 2013-03-22 ENCOUNTER — Other Ambulatory Visit: Payer: Self-pay

## 2013-03-22 MED ORDER — OXYBUTYNIN CHLORIDE 5 MG PO TABS: ORAL_TABLET | ORAL | Status: DC

## 2013-03-22 NOTE — Telephone Encounter (Signed)
Pt request refill oxybutynin to [facility]. Advised pt done.

## 2013-05-30 ENCOUNTER — Encounter: Payer: Self-pay | Admitting: Internal Medicine

## 2013-05-30 ENCOUNTER — Ambulatory Visit (INDEPENDENT_AMBULATORY_CARE_PROVIDER_SITE_OTHER): Payer: Medicare Other | Admitting: Internal Medicine

## 2013-05-30 VITALS — BP 130/60 | HR 50 | Temp 98.0°F | Wt 201.0 lb

## 2013-05-30 DIAGNOSIS — L600 Ingrowing nail: Secondary | ICD-10-CM

## 2013-05-30 DIAGNOSIS — Z23 Encounter for immunization: Secondary | ICD-10-CM | POA: Diagnosis not present

## 2013-05-30 NOTE — Addendum Note (Signed)
Addended by: Sueanne Margarita on: 05/30/2013 01:21 PM   Modules accepted: Orders

## 2013-05-30 NOTE — Assessment & Plan Note (Signed)
With recurrent mild paronychia  Discussed soaks if gets inflamed again If recurrent, would do nail resection
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
## 2013-05-30 NOTE — Patient Instructions (Signed)
If your thumb gets irritated again, soak in water with epsom salts to get it to drain. If this keeps happening, set up an appointment for me to numb your finger and cut down the side of the nail (this will take about 15 minutes)

## 2013-06-08 ENCOUNTER — Other Ambulatory Visit: Payer: Self-pay | Admitting: *Deleted

## 2013-06-08 MED ORDER — DOXAZOSIN MESYLATE 4 MG PO TABS: 4.0000 mg | ORAL_TABLET | Freq: Every day | ORAL | Status: DC

## 2013-08-10 DIAGNOSIS — H524 Presbyopia: Secondary | ICD-10-CM | POA: Diagnosis not present

## 2013-08-10 DIAGNOSIS — H2510 Age-related nuclear cataract, unspecified eye: Secondary | ICD-10-CM | POA: Diagnosis not present

## 2013-08-14 ENCOUNTER — Ambulatory Visit (INDEPENDENT_AMBULATORY_CARE_PROVIDER_SITE_OTHER): Payer: Medicare Other | Admitting: Internal Medicine

## 2013-08-14 ENCOUNTER — Encounter: Payer: Self-pay | Admitting: Internal Medicine

## 2013-08-14 VITALS — BP 118/60 | HR 81 | Temp 98.2°F | Ht 70.0 in | Wt 203.0 lb

## 2013-08-14 DIAGNOSIS — M159 Polyosteoarthritis, unspecified: Secondary | ICD-10-CM

## 2013-08-14 DIAGNOSIS — Z0000 Encounter for general adult medical examination without abnormal findings: Secondary | ICD-10-CM

## 2013-08-14 DIAGNOSIS — N400 Enlarged prostate without lower urinary tract symptoms: Secondary | ICD-10-CM | POA: Diagnosis not present

## 2013-08-14 DIAGNOSIS — I10 Essential (primary) hypertension: Secondary | ICD-10-CM

## 2013-08-14 DIAGNOSIS — D510 Vitamin B12 deficiency anemia due to intrinsic factor deficiency: Secondary | ICD-10-CM | POA: Diagnosis not present

## 2013-08-14 LAB — CBC WITH DIFFERENTIAL/PLATELET
BASOS ABS: 0 10*3/uL (ref 0.0–0.1)
BASOS PCT: 0.7 % (ref 0.0–3.0)
EOS PCT: 5.8 % — AB (ref 0.0–5.0)
Eosinophils Absolute: 0.3 10*3/uL (ref 0.0–0.7)
HCT: 38.4 % — ABNORMAL LOW (ref 39.0–52.0)
HEMOGLOBIN: 12.8 g/dL — AB (ref 13.0–17.0)
LYMPHS PCT: 31.6 % (ref 12.0–46.0)
Lymphs Abs: 1.7 10*3/uL (ref 0.7–4.0)
MCHC: 33.2 g/dL (ref 30.0–36.0)
MCV: 88.1 fl (ref 78.0–100.0)
MONOS PCT: 7.8 % (ref 3.0–12.0)
Monocytes Absolute: 0.4 10*3/uL (ref 0.1–1.0)
NEUTROS ABS: 2.9 10*3/uL (ref 1.4–7.7)
Neutrophils Relative %: 54.1 % (ref 43.0–77.0)
Platelets: 179 10*3/uL (ref 150.0–400.0)
RBC: 4.36 Mil/uL (ref 4.22–5.81)
RDW: 15.4 % — ABNORMAL HIGH (ref 11.5–14.6)
WBC: 5.4 10*3/uL (ref 4.5–10.5)

## 2013-08-14 LAB — TSH: TSH: 1.4 u[IU]/mL (ref 0.35–5.50)

## 2013-08-14 LAB — BASIC METABOLIC PANEL
BUN: 21 mg/dL (ref 6–23)
CALCIUM: 8.9 mg/dL (ref 8.4–10.5)
CO2: 28 mEq/L (ref 19–32)
Chloride: 108 mEq/L (ref 96–112)
Creatinine, Ser: 0.9 mg/dL (ref 0.4–1.5)
GFR: 93.01 mL/min (ref >60.00)
Glucose, Bld: 70 mg/dL (ref 70–99)
POTASSIUM: 4.4 meq/L (ref 3.5–5.1)
SODIUM: 141 meq/L (ref 135–145)

## 2013-08-14 LAB — HEPATIC FUNCTION PANEL
ALT: 21 U/L (ref 0–53)
AST: 27 U/L (ref 0–37)
Albumin: 3.8 g/dL (ref 3.5–5.2)
Alkaline Phosphatase: 94 U/L (ref 39–117)
BILIRUBIN DIRECT: 0 mg/dL (ref 0.0–0.3)
Total Bilirubin: 0.4 mg/dL (ref 0.3–1.2)
Total Protein: 6.9 g/dL (ref 6.0–8.3)

## 2013-08-14 LAB — VITAMIN B12

## 2013-08-14 NOTE — Assessment & Plan Note (Addendum)
I have personally reviewed the Medicare Annual Wellness questionnaire and have noted 1. The patient's medical and social history 2. Their use of alcohol, tobacco or illicit drugs 3. Their current medications and supplements 4. The patient's functional ability including ADL's, fall risks, home safety risks and hearing or visual             impairment. 5. Diet and physical activities 6. Evidence for depression or mood disorders  The patients weight, height, BMI and visual acuity have been recorded in the chart I have made referrals, counseling and provided education to the patient based review of the above and I have provided the pt with a written personalized care plan for preventive services.  I have provided you with a copy of your personalized plan for preventive services. Please take the time to review along with your updated medication list.   Mild cognitive problems---will follow UTD with immunizations and colon No PSA due to age

## 2013-08-14 NOTE — Progress Notes (Signed)
Pre-visit discussion using our clinic review tool. No additional management support is needed unless otherwise documented below in the visit note.  
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
## 2013-08-14 NOTE — Assessment & Plan Note (Signed)
BP Readings from Last 3 Encounters:  08/14/13 118/60  05/30/13 130/60  02/06/13 110/60   Has been running low Will stop the lisinopril since he has episodic dizziness

## 2013-08-14 NOTE — Assessment & Plan Note (Signed)
Doing okay If the dizziness continues, will change to flomax

## 2013-08-14 NOTE — Assessment & Plan Note (Signed)
Mostly shoulders Doesn't seem to be PMR Using the diclofenac

## 2013-08-14 NOTE — Assessment & Plan Note (Signed)
Actually doing okay on the oral prep

## 2013-08-14 NOTE — Patient Instructions (Addendum)
If you are taking the diclofenac regularly (once or twice a day), please add over the counter omeprazole 20mg  daily on an empty stomach. This will protect your stomach.  Please stop the lisinopril. If your dizziness continues, call and I will change the doxazosin (prostate pill) that might be causing the dizziness.

## 2013-08-15 ENCOUNTER — Encounter: Payer: Self-pay | Admitting: *Deleted

## 2013-08-20 DIAGNOSIS — H25019 Cortical age-related cataract, unspecified eye: Secondary | ICD-10-CM | POA: Diagnosis not present

## 2013-08-20 DIAGNOSIS — H25049 Posterior subcapsular polar age-related cataract, unspecified eye: Secondary | ICD-10-CM | POA: Diagnosis not present

## 2013-08-20 DIAGNOSIS — H43819 Vitreous degeneration, unspecified eye: Secondary | ICD-10-CM | POA: Diagnosis not present

## 2013-08-20 DIAGNOSIS — H02839 Dermatochalasis of unspecified eye, unspecified eyelid: Secondary | ICD-10-CM | POA: Diagnosis not present

## 2013-08-20 DIAGNOSIS — H2510 Age-related nuclear cataract, unspecified eye: Secondary | ICD-10-CM | POA: Diagnosis not present

## 2013-08-20 DIAGNOSIS — H18419 Arcus senilis, unspecified eye: Secondary | ICD-10-CM | POA: Diagnosis not present

## 2013-08-27 ENCOUNTER — Ambulatory Visit (INDEPENDENT_AMBULATORY_CARE_PROVIDER_SITE_OTHER): Payer: Medicare Other | Admitting: Internal Medicine

## 2013-08-27 ENCOUNTER — Encounter: Payer: Self-pay | Admitting: Internal Medicine

## 2013-08-27 VITALS — BP 140/80 | HR 75 | Temp 97.7°F | Wt 202.0 lb

## 2013-08-27 DIAGNOSIS — J019 Acute sinusitis, unspecified: Secondary | ICD-10-CM

## 2013-08-27 MED ORDER — AMOXICILLIN 500 MG PO TABS: 1000.0000 mg | ORAL_TABLET | Freq: Two times a day (BID) | ORAL | Status: DC

## 2013-08-27 NOTE — Progress Notes (Signed)
Pre-visit discussion using our clinic review tool. No additional management support is needed unless otherwise documented below in the visit note. Pre-visit discussion using our clinic review tool. No additional management support is needed unless otherwise documented below in the visit note.
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
## 2013-08-27 NOTE — Patient Instructions (Signed)
Please try acetaminophen 650mg  (2- 325mg  tabs) up to every 4 hours. If you get sicker as the week goes on, more cough, fever, nasty drainage-- go ahead and start the amoxicillin antibiotic.

## 2013-08-27 NOTE — Assessment & Plan Note (Signed)
May still just be viral Discussed acetaminophen If worsens, will have him start the amoxicillin

## 2013-08-29 ENCOUNTER — Other Ambulatory Visit: Payer: Self-pay | Admitting: *Deleted

## 2013-08-29 MED ORDER — OXYBUTYNIN CHLORIDE 5 MG PO TABS: 5.0000 mg | ORAL_TABLET | Freq: Two times a day (BID) | ORAL | Status: DC

## 2013-09-02 HISTORY — PX: CATARACT EXTRACTION, BILATERAL: SHX1313

## 2013-09-02 HISTORY — PX: CATARACT EXTRACTION W/ INTRAOCULAR LENS  IMPLANT, BILATERAL: SHX1307

## 2013-09-03 DIAGNOSIS — H2510 Age-related nuclear cataract, unspecified eye: Secondary | ICD-10-CM | POA: Diagnosis not present

## 2013-09-03 DIAGNOSIS — H269 Unspecified cataract: Secondary | ICD-10-CM | POA: Diagnosis not present

## 2013-09-04 DIAGNOSIS — H2510 Age-related nuclear cataract, unspecified eye: Secondary | ICD-10-CM | POA: Diagnosis not present

## 2013-09-05 ENCOUNTER — Telehealth: Payer: Self-pay | Admitting: Internal Medicine

## 2013-09-05 NOTE — Telephone Encounter (Signed)
Relevant patient education mailed to patient.  

## 2013-09-13 ENCOUNTER — Telehealth: Payer: Self-pay | Admitting: Internal Medicine

## 2013-09-13 NOTE — Telephone Encounter (Signed)
Yes, please schedule.

## 2013-09-13 NOTE — Telephone Encounter (Signed)
Pt called wanting an appt for today to get his ears flushed out. He went and saw his hearing aid doctor and told him he has wax pushing up against his ear drum and needs to be cleaned out. Is it ok to schedule him in your 10:15 same day appointment for tomorrow 09/14/13?

## 2013-09-14 ENCOUNTER — Ambulatory Visit (INDEPENDENT_AMBULATORY_CARE_PROVIDER_SITE_OTHER): Payer: Medicare Other | Admitting: Internal Medicine

## 2013-09-14 ENCOUNTER — Encounter: Payer: Self-pay | Admitting: Internal Medicine

## 2013-09-14 VITALS — BP 140/70 | HR 60 | Temp 98.4°F | Wt 205.0 lb

## 2013-09-14 DIAGNOSIS — H6120 Impacted cerumen, unspecified ear: Secondary | ICD-10-CM | POA: Diagnosis not present

## 2013-09-14 NOTE — Progress Notes (Signed)
Pre-visit discussion using our clinic review tool. No additional management support is needed unless otherwise documented below in the visit note.  

## 2013-09-14 NOTE — Patient Instructions (Signed)
You can try 1/2 strength peroxide or debrox drops--- ~5 drops in the ear weekly --to keep the wax from building up

## 2013-09-14 NOTE — Assessment & Plan Note (Signed)
Reasonably clear after lavage Discussed home care
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
## 2013-09-17 DIAGNOSIS — H2510 Age-related nuclear cataract, unspecified eye: Secondary | ICD-10-CM | POA: Diagnosis not present

## 2013-09-17 DIAGNOSIS — H269 Unspecified cataract: Secondary | ICD-10-CM | POA: Diagnosis not present

## 2013-09-17 DIAGNOSIS — Z961 Presence of intraocular lens: Secondary | ICD-10-CM | POA: Diagnosis not present

## 2014-04-22 ENCOUNTER — Other Ambulatory Visit: Payer: Self-pay | Admitting: *Deleted

## 2014-04-22 MED ORDER — OXYBUTYNIN CHLORIDE 5 MG PO TABS: 5.0000 mg | ORAL_TABLET | Freq: Two times a day (BID) | ORAL | Status: DC

## 2014-04-24 ENCOUNTER — Telehealth: Payer: Self-pay | Admitting: *Deleted

## 2014-04-24 NOTE — Telephone Encounter (Signed)
Form faxed and scanned.

## 2014-04-24 NOTE — Telephone Encounter (Signed)
Form done 

## 2014-04-24 NOTE — Telephone Encounter (Signed)
Please see Tier Exception form for Oxybutynin 5mg  tablet taking twice daily Form on your desk

## 2014-04-25 NOTE — Telephone Encounter (Signed)
[employer]eroy [employer] Medicare left v/m oxybutynin was approved for 1 year from 04/24/2014. Approval letter to follow and pt was notified by [facility].

## 2014-06-02 DIAGNOSIS — Z23 Encounter for immunization: Secondary | ICD-10-CM | POA: Diagnosis not present

## 2014-07-03 ENCOUNTER — Other Ambulatory Visit: Payer: Self-pay | Admitting: Internal Medicine

## 2014-08-16 ENCOUNTER — Encounter: Payer: Medicare Other | Admitting: Internal Medicine

## 2014-08-21 ENCOUNTER — Encounter: Payer: Self-pay | Admitting: *Deleted

## 2014-08-21 ENCOUNTER — Ambulatory Visit (INDEPENDENT_AMBULATORY_CARE_PROVIDER_SITE_OTHER): Payer: Medicare Other | Admitting: Internal Medicine

## 2014-08-21 ENCOUNTER — Encounter: Payer: Self-pay | Admitting: Internal Medicine

## 2014-08-21 VITALS — BP 122/70 | HR 60 | Temp 98.2°F | Ht 70.0 in | Wt 198.0 lb

## 2014-08-21 DIAGNOSIS — D510 Vitamin B12 deficiency anemia due to intrinsic factor deficiency: Secondary | ICD-10-CM

## 2014-08-21 DIAGNOSIS — I10 Essential (primary) hypertension: Secondary | ICD-10-CM | POA: Diagnosis not present

## 2014-08-21 DIAGNOSIS — N400 Enlarged prostate without lower urinary tract symptoms: Secondary | ICD-10-CM

## 2014-08-21 DIAGNOSIS — Z7189 Other specified counseling: Secondary | ICD-10-CM | POA: Diagnosis not present

## 2014-08-21 DIAGNOSIS — R55 Syncope and collapse: Secondary | ICD-10-CM | POA: Insufficient documentation

## 2014-08-21 DIAGNOSIS — Z0000 Encounter for general adult medical examination without abnormal findings: Secondary | ICD-10-CM

## 2014-08-21 DIAGNOSIS — R42 Dizziness and giddiness: Secondary | ICD-10-CM | POA: Diagnosis not present

## 2014-08-21 DIAGNOSIS — Z23 Encounter for immunization: Secondary | ICD-10-CM

## 2014-08-21 LAB — COMPREHENSIVE METABOLIC PANEL
ALBUMIN: 4 g/dL (ref 3.5–5.2)
ALK PHOS: 117 U/L (ref 39–117)
ALT: 19 U/L (ref 0–53)
AST: 27 U/L (ref 0–37)
BILIRUBIN TOTAL: 0.6 mg/dL (ref 0.2–1.2)
BUN: 19 mg/dL (ref 6–23)
CO2: 29 meq/L (ref 19–32)
Calcium: 9.4 mg/dL (ref 8.4–10.5)
Chloride: 106 mEq/L (ref 96–112)
Creatinine, Ser: 0.74 mg/dL (ref 0.40–1.50)
GFR: 108.84 mL/min (ref >60.00)
GLUCOSE: 101 mg/dL — AB (ref 70–99)
POTASSIUM: 4 meq/L (ref 3.5–5.1)
SODIUM: 140 meq/L (ref 135–145)
TOTAL PROTEIN: 7 g/dL (ref 6.0–8.3)

## 2014-08-21 LAB — CBC WITH DIFFERENTIAL/PLATELET
BASOS ABS: 0 10*3/uL (ref 0.0–0.1)
Basophils Relative: 0.7 % (ref 0.0–3.0)
EOS ABS: 0.2 10*3/uL (ref 0.0–0.7)
EOS PCT: 3.9 % (ref 0.0–5.0)
HCT: 41.3 % (ref 39.0–52.0)
HEMOGLOBIN: 14 g/dL (ref 13.0–17.0)
Lymphocytes Relative: 28.1 % (ref 12.0–46.0)
Lymphs Abs: 1.5 10*3/uL (ref 0.7–4.0)
MCHC: 33.9 g/dL (ref 30.0–36.0)
MCV: 88.3 fl (ref 78.0–100.0)
MONO ABS: 0.4 10*3/uL (ref 0.1–1.0)
MONOS PCT: 6.7 % (ref 3.0–12.0)
Neutro Abs: 3.3 10*3/uL (ref 1.4–7.7)
Neutrophils Relative %: 60.6 % (ref 43.0–77.0)
Platelets: 164 10*3/uL (ref 150.0–400.0)
RBC: 4.68 Mil/uL (ref 4.22–5.81)
RDW: 14.1 % (ref 11.5–15.5)
WBC: 5.5 10*3/uL (ref 4.0–10.5)

## 2014-08-21 LAB — T4, FREE: Free T4: 1.06 ng/dL (ref 0.60–1.60)

## 2014-08-21 LAB — VITAMIN B12: VITAMIN B 12: 512 pg/mL (ref 211–911)

## 2014-08-21 MED ORDER — TAMSULOSIN HCL 0.4 MG PO CAPS: 0.4000 mg | ORAL_CAPSULE | Freq: Every day | ORAL | Status: DC

## 2014-08-21 NOTE — Addendum Note (Signed)
Addended by: Despina Hidden on: 08/21/2014 01:14 PM   Modules accepted: Orders

## 2014-08-21 NOTE — Assessment & Plan Note (Signed)
Voids okay Will change to tamsulosin to see if that helps the dizziness

## 2014-08-21 NOTE — Assessment & Plan Note (Signed)
BP Readings from Last 3 Encounters:  08/21/14 122/70  09/14/13 140/70  08/27/13 140/80   Good control despite off the lisinopril

## 2014-08-21 NOTE — Assessment & Plan Note (Signed)
Unclear diagnosis since on oral B12 Chronic mild anemia--will recheck

## 2014-08-21 NOTE — Progress Notes (Signed)
Pre visit review using our clinic review tool, if applicable. No additional management support is needed unless otherwise documented below in the visit note. 

## 2014-08-21 NOTE — Assessment & Plan Note (Signed)
I have personally reviewed the Medicare Annual Wellness questionnaire and have noted 1. The patient's medical and social history 2. Their use of alcohol, tobacco or illicit drugs 3. Their current medications and supplements 4. The patient's functional ability including ADL's, fall risks, home safety risks and hearing or visual             impairment. 5. Diet and physical activities 6. Evidence for depression or mood disorders  The patients weight, height, BMI and visual acuity have been recorded in the chart I have made referrals, counseling and provided education to the patient based review of the above and I have provided the pt with a written personalized care plan for preventive services.  I have provided you with a copy of your personalized plan for preventive services. Please take the time to review along with your updated medication list.  No cancer screening due to age prevnar today Stays active
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
## 2014-08-21 NOTE — Patient Instructions (Signed)
Please start the tamsulosin when it arrives (for the enlarged prostate) and stop the doxazosin. I hope that it will make your dizziness better.

## 2014-08-21 NOTE — Assessment & Plan Note (Signed)
Vague history but when up for a while Will try changing the doxazosin as that is the most likely cause

## 2014-08-21 NOTE — Assessment & Plan Note (Signed)
See social history Blank forms given 

## 2014-08-26 DIAGNOSIS — Z9849 Cataract extraction status, unspecified eye: Secondary | ICD-10-CM | POA: Diagnosis not present

## 2014-08-26 DIAGNOSIS — H52223 Regular astigmatism, bilateral: Secondary | ICD-10-CM | POA: Diagnosis not present

## 2014-11-25 ENCOUNTER — Encounter: Payer: Self-pay | Admitting: Internal Medicine

## 2014-11-25 ENCOUNTER — Ambulatory Visit (INDEPENDENT_AMBULATORY_CARE_PROVIDER_SITE_OTHER): Payer: Medicare Other | Admitting: Internal Medicine

## 2014-11-25 VITALS — BP 150/90 | HR 84 | Temp 98.0°F | Wt 194.0 lb

## 2014-11-25 DIAGNOSIS — M778 Other enthesopathies, not elsewhere classified: Secondary | ICD-10-CM | POA: Insufficient documentation

## 2014-11-25 DIAGNOSIS — M659 Synovitis and tenosynovitis, unspecified: Secondary | ICD-10-CM | POA: Diagnosis not present
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
## 2014-11-25 NOTE — Progress Notes (Signed)
Pre visit review using our clinic review tool, if applicable. No additional management support is needed unless otherwise documented below in the visit note. 

## 2014-11-25 NOTE — Patient Instructions (Addendum)
Try ice on the inside of your left elbow when it is painful. You can try a heat rub before you use it to keep it loose (but not when it is hurting).

## 2014-11-25 NOTE — Assessment & Plan Note (Signed)
He doesn't remember any new tasks Reassured--not related to blood draw Try ice if painful---heat if tight

## 2015-04-28 ENCOUNTER — Other Ambulatory Visit: Payer: Self-pay | Admitting: Internal Medicine

## 2015-06-17 ENCOUNTER — Encounter: Payer: Self-pay | Admitting: Internal Medicine

## 2015-06-17 ENCOUNTER — Ambulatory Visit (INDEPENDENT_AMBULATORY_CARE_PROVIDER_SITE_OTHER): Payer: Medicare Other | Admitting: Internal Medicine

## 2015-06-17 VITALS — BP 134/80 | HR 62 | Temp 98.4°F | Wt 195.0 lb

## 2015-06-17 DIAGNOSIS — R300 Dysuria: Secondary | ICD-10-CM | POA: Diagnosis not present

## 2015-06-17 DIAGNOSIS — N400 Enlarged prostate without lower urinary tract symptoms: Secondary | ICD-10-CM

## 2015-06-17 DIAGNOSIS — M791 Myalgia, unspecified site: Secondary | ICD-10-CM | POA: Insufficient documentation

## 2015-06-17 DIAGNOSIS — L03115 Cellulitis of right lower limb: Secondary | ICD-10-CM | POA: Diagnosis not present

## 2015-06-17 DIAGNOSIS — Z23 Encounter for immunization: Secondary | ICD-10-CM

## 2015-06-17 LAB — POCT URINALYSIS DIPSTICK
BILIRUBIN UA: NEGATIVE
Glucose, UA: NEGATIVE
KETONES UA: NEGATIVE
Leukocytes, UA: NEGATIVE
Nitrite, UA: NEGATIVE
PH UA: 6
PROTEIN UA: NEGATIVE
RBC UA: NEGATIVE
Urobilinogen, UA: NEGATIVE

## 2015-06-17 MED ORDER — FINASTERIDE 5 MG PO TABS: 5.0000 mg | ORAL_TABLET | Freq: Every day | ORAL | Status: DC

## 2015-06-17 MED ORDER — MUPIROCIN 2 % EX OINT: 1.0000 "application " | TOPICAL_OINTMENT | Freq: Three times a day (TID) | CUTANEOUS | Status: DC

## 2015-06-17 MED ORDER — SULFAMETHOXAZOLE-TRIMETHOPRIM 800-160 MG PO TABS: 1.0000 | ORAL_TABLET | Freq: Two times a day (BID) | ORAL | Status: DC

## 2015-06-17 NOTE — Progress Notes (Signed)
Pre visit review using our clinic review tool, if applicable. No additional management support is needed unless otherwise documented below in the visit note. 

## 2015-06-17 NOTE — Assessment & Plan Note (Addendum)
Clearly has some symptoms of worsening BPH, but may also have some prostatitis Has some enlargement of left testes--but no tenderness Urinalysis benign Treating cellulitis with sulfa should cover both

## 2015-06-17 NOTE — Addendum Note (Signed)
Addended by: Despina Hidden on: 06/17/2015 03:03 PM   Modules accepted: Orders

## 2015-06-17 NOTE — Assessment & Plan Note (Signed)
Ongoing worsening for months Will add finasteride

## 2015-06-17 NOTE — Assessment & Plan Note (Signed)
In shoulder area Will check sed rate to be sure no suggestion of PMR
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
## 2015-06-17 NOTE — Assessment & Plan Note (Addendum)
Appearance suggests MRSA Given prostate symptoms, will try septra and change if it doesn't resolve over time Mupirocin ointment topically

## 2015-06-18 ENCOUNTER — Encounter: Payer: Self-pay | Admitting: *Deleted

## 2015-06-18 ENCOUNTER — Other Ambulatory Visit: Payer: Medicare Other

## 2015-06-18 LAB — SEDIMENTATION RATE: SED RATE: 40 mm/h — AB (ref 0–22)

## 2015-06-20 ENCOUNTER — Ambulatory Visit (INDEPENDENT_AMBULATORY_CARE_PROVIDER_SITE_OTHER): Payer: Medicare Other | Admitting: Internal Medicine

## 2015-06-20 ENCOUNTER — Encounter: Payer: Self-pay | Admitting: Internal Medicine

## 2015-06-20 VITALS — BP 150/80 | HR 91 | Temp 98.1°F | Wt 191.0 lb

## 2015-06-20 DIAGNOSIS — M791 Myalgia, unspecified site: Secondary | ICD-10-CM

## 2015-06-20 DIAGNOSIS — L03115 Cellulitis of right lower limb: Secondary | ICD-10-CM

## 2015-06-20 MED ORDER — PREDNISONE 20 MG PO TABS: 20.0000 mg | ORAL_TABLET | Freq: Every day | ORAL | Status: DC

## 2015-06-20 MED ORDER — CEPHALEXIN 500 MG PO CAPS: 500.0000 mg | ORAL_CAPSULE | Freq: Four times a day (QID) | ORAL | Status: DC

## 2015-06-20 MED ORDER — CEFTRIAXONE SODIUM 1 G IJ SOLR
1.0000 g | Freq: Once | INTRAMUSCULAR | Status: AC
  Administered 2015-06-20: 1 g via INTRAMUSCULAR

## 2015-06-20 NOTE — Patient Instructions (Signed)
Start the new antibiotic, cephalexin, but continue the other one. Start the prednisone 1 tablet daily. We will review this in 2 weeks
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
## 2015-06-20 NOTE — Assessment & Plan Note (Signed)
Sed rate only mildly elevated but relates a year of shoulder/arm aching Will try prednisone and see back in 2 weeks  If striking response, probably PMR

## 2015-06-20 NOTE — Assessment & Plan Note (Signed)
Not really better on the bactrim Will give rocephin-- and add cephalexin

## 2015-06-20 NOTE — Progress Notes (Signed)
Pre visit review using our clinic review tool, if applicable. No additional management support is needed unless otherwise documented below in the visit note. 

## 2015-06-20 NOTE — Addendum Note (Signed)
Addended by: Despina Hidden on: 06/20/2015 07:54 AM   Modules accepted: Orders

## 2015-07-04 ENCOUNTER — Ambulatory Visit (INDEPENDENT_AMBULATORY_CARE_PROVIDER_SITE_OTHER): Payer: Medicare Other | Admitting: Internal Medicine

## 2015-07-04 ENCOUNTER — Encounter: Payer: Self-pay | Admitting: Internal Medicine

## 2015-07-04 VITALS — BP 140/80 | HR 66 | Temp 97.7°F | Wt 189.0 lb

## 2015-07-04 DIAGNOSIS — L03115 Cellulitis of right lower limb: Secondary | ICD-10-CM

## 2015-07-04 DIAGNOSIS — B354 Tinea corporis: Secondary | ICD-10-CM | POA: Insufficient documentation

## 2015-07-04 DIAGNOSIS — M353 Polymyalgia rheumatica: Secondary | ICD-10-CM

## 2015-07-04 MED ORDER — KETOCONAZOLE 2 % EX CREA: 1.0000 "application " | TOPICAL_CREAM | Freq: Two times a day (BID) | CUTANEOUS | Status: DC | PRN

## 2015-07-04 MED ORDER — PREDNISONE 5 MG PO TABS: 15.0000 mg | ORAL_TABLET | Freq: Every day | ORAL | Status: DC
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
## 2015-07-04 NOTE — Patient Instructions (Signed)
Please take the prednisone 20mg  daily till it runs out--then start 3 of the 5mg  tabs for a total of 15mg  daily.

## 2015-07-04 NOTE — Assessment & Plan Note (Signed)
Had striking response to prednisone  ESR not that high but I think this is his diagnosis Will finish the 32m --then go down to 143m

## 2015-07-04 NOTE — Assessment & Plan Note (Signed)
resolved 

## 2015-07-04 NOTE — Progress Notes (Signed)
Pre visit review using our clinic review tool, if applicable. No additional management support is needed unless otherwise documented below in the visit note. 

## 2015-07-04 NOTE — Assessment & Plan Note (Signed)
At the base of the glans No true balanitis Not sure if this could be related to the dysuria (though urine benign) Will try ketoconazole cream

## 2015-07-24 ENCOUNTER — Other Ambulatory Visit: Payer: Self-pay | Admitting: Internal Medicine

## 2015-08-08 ENCOUNTER — Other Ambulatory Visit: Payer: Self-pay | Admitting: Internal Medicine

## 2015-08-27 ENCOUNTER — Ambulatory Visit (INDEPENDENT_AMBULATORY_CARE_PROVIDER_SITE_OTHER): Payer: Medicare Other | Admitting: Internal Medicine

## 2015-08-27 ENCOUNTER — Encounter: Payer: Self-pay | Admitting: *Deleted

## 2015-08-27 ENCOUNTER — Encounter: Payer: Self-pay | Admitting: Internal Medicine

## 2015-08-27 ENCOUNTER — Telehealth: Payer: Self-pay | Admitting: *Deleted

## 2015-08-27 VITALS — BP 140/70 | HR 55 | Temp 98.0°F | Ht 70.0 in | Wt 196.0 lb

## 2015-08-27 DIAGNOSIS — Z7189 Other specified counseling: Secondary | ICD-10-CM

## 2015-08-27 DIAGNOSIS — I10 Essential (primary) hypertension: Secondary | ICD-10-CM | POA: Diagnosis not present

## 2015-08-27 DIAGNOSIS — M353 Polymyalgia rheumatica: Secondary | ICD-10-CM | POA: Diagnosis not present

## 2015-08-27 DIAGNOSIS — N400 Enlarged prostate without lower urinary tract symptoms: Secondary | ICD-10-CM

## 2015-08-27 DIAGNOSIS — D510 Vitamin B12 deficiency anemia due to intrinsic factor deficiency: Secondary | ICD-10-CM | POA: Diagnosis not present

## 2015-08-27 DIAGNOSIS — Z0000 Encounter for general adult medical examination without abnormal findings: Secondary | ICD-10-CM | POA: Diagnosis not present

## 2015-08-27 LAB — CBC WITH DIFFERENTIAL/PLATELET
BASOS ABS: 0 10*3/uL (ref 0.0–0.1)
Basophils Relative: 0.3 % (ref 0.0–3.0)
EOS PCT: 0.5 % (ref 0.0–5.0)
Eosinophils Absolute: 0 10*3/uL (ref 0.0–0.7)
HCT: 43.3 % (ref 39.0–52.0)
HEMOGLOBIN: 14 g/dL (ref 13.0–17.0)
LYMPHS ABS: 2.4 10*3/uL (ref 0.7–4.0)
LYMPHS PCT: 28.7 % (ref 12.0–46.0)
MCHC: 32.3 g/dL (ref 30.0–36.0)
MCV: 91.1 fl (ref 78.0–100.0)
Monocytes Absolute: 0.6 10*3/uL (ref 0.1–1.0)
Monocytes Relative: 7.4 % (ref 3.0–12.0)
NEUTROS PCT: 63.1 % (ref 43.0–77.0)
Neutro Abs: 5.3 10*3/uL (ref 1.4–7.7)
Platelets: 181 10*3/uL (ref 150.0–400.0)
RBC: 4.75 Mil/uL (ref 4.22–5.81)
RDW: 15.2 % (ref 11.5–15.5)
WBC: 8.5 10*3/uL (ref 4.0–10.5)

## 2015-08-27 LAB — SEDIMENTATION RATE: SED RATE: 9 mm/h (ref 0–22)

## 2015-08-27 LAB — COMPREHENSIVE METABOLIC PANEL
ALK PHOS: 66 U/L (ref 39–117)
ALT: 17 U/L (ref 0–53)
AST: 21 U/L (ref 0–37)
Albumin: 3.9 g/dL (ref 3.5–5.2)
BILIRUBIN TOTAL: 0.6 mg/dL (ref 0.2–1.2)
BUN: 19 mg/dL (ref 6–23)
CALCIUM: 9.3 mg/dL (ref 8.4–10.5)
CO2: 30 mEq/L (ref 19–32)
Chloride: 104 mEq/L (ref 96–112)
Creatinine, Ser: 0.77 mg/dL (ref 0.40–1.50)
GFR: 103.69 mL/min (ref >60.00)
GLUCOSE: 96 mg/dL (ref 70–99)
POTASSIUM: 3.7 meq/L (ref 3.5–5.1)
Sodium: 140 mEq/L (ref 135–145)
TOTAL PROTEIN: 6.5 g/dL (ref 6.0–8.3)

## 2015-08-27 LAB — VITAMIN B12: Vitamin B-12: 445 pg/mL (ref 211–911)

## 2015-08-27 MED ORDER — TETANUS-DIPHTHERIA TOXOIDS TD 5-2 LFU IM INJ: 0.5000 mL | INJECTION | Freq: Once | INTRAMUSCULAR | Status: DC

## 2015-08-27 MED ORDER — TAMSULOSIN HCL 0.4 MG PO CAPS: 0.4000 mg | ORAL_CAPSULE | Freq: Every day | ORAL | Status: DC
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
## 2015-08-27 NOTE — Assessment & Plan Note (Signed)
BP Readings from Last 3 Encounters:  08/27/15 140/70  07/04/15 140/80  06/20/15 150/80   Okay without rx now

## 2015-08-27 NOTE — Assessment & Plan Note (Signed)
Will recheck level

## 2015-08-27 NOTE — Patient Instructions (Addendum)
Please try off the oxybutynin to be sure it is helping your bladder. If it is helping, it is okay to stay on. I will let you know if we can try decreasing the prednisone.

## 2015-08-27 NOTE — Telephone Encounter (Signed)
-----   Message from Venia Carbon, MD sent at 08/27/2015  2:04 PM EST ----- Please call him The sed rate test is normal--have him decrease the prednisone to 2 pills daily (10mg ) Everything else was normal (B12 fine but he should continue the supplement) Send him copy and update med list

## 2015-08-27 NOTE — Assessment & Plan Note (Signed)
Hasn't done POA See social history

## 2015-08-27 NOTE — Assessment & Plan Note (Signed)
Seems to have remitted If sed rate normal, will cut back to 10mg  daily on prednisone

## 2015-08-27 NOTE — Assessment & Plan Note (Signed)
Voiding okay but still with symptoms Will have him confirm the oxybutynin is doing any good--or stop

## 2015-08-27 NOTE — Assessment & Plan Note (Signed)
I have personally reviewed the Medicare Annual Wellness questionnaire and have noted 1. The patient's medical and social history 2. Their use of alcohol, tobacco or illicit drugs 3. Their current medications and supplements 4. The patient's functional ability including ADL's, fall risks, home safety risks and hearing or visual             impairment. 5. Diet and physical activities 6. Evidence for depression or mood disorders  The patients weight, height, BMI and visual acuity have been recorded in the chart I have made referrals, counseling and provided education to the patient based review of the above and I have provided the pt with a written personalized care plan for preventive services.  I have provided you with a copy of your personalized plan for preventive services. Please take the time to review along with your updated medication list.  Td order sent No cancer screening due to age Discussed safety with tractor, etc

## 2015-08-27 NOTE — Progress Notes (Signed)
Pre visit review using our clinic review tool, if applicable. No additional management support is needed unless otherwise documented below in the visit note. 

## 2015-08-28 NOTE — Addendum Note (Signed)
Addended by: Marchia Bond on: 08/28/2015 03:43 PM   Modules accepted: Miquel Dunn

## 2015-08-29 ENCOUNTER — Telehealth: Payer: Self-pay | Admitting: Internal Medicine

## 2015-08-29 NOTE — Telephone Encounter (Signed)
Left detailed message on the VM per wife, pt was not home, advised pt to call if any questions

## 2015-08-29 NOTE — Telephone Encounter (Signed)
Not sure what the other one is. Should be Td If the other one is a Tdap---that would be better But otherwise, give the one I wrote for

## 2015-08-29 NOTE — Telephone Encounter (Signed)
Pt called stating [facility] needs to know which tentius shot he needs  The one dr Silvio Pate called is cost $46 [facility] a different on costing $36 Please let pharmacy know which one he needs

## 2015-09-01 NOTE — Telephone Encounter (Signed)
Spoke with [facility] and advised that tdap is ok to give patient

## 2015-09-01 NOTE — Telephone Encounter (Signed)
Kala from [facility] called stating that she left a message last week about the Tetanus that Dr. Silvio Pate ordered. Ophelia Charter stated that the tetanus is not available there, but they have the tdap. Ophelia Charter stated that if patient needs the tetanus he will have to go somewhere else to get it. Ophelia Charter is requesting a call back regarding the order.

## 2015-09-03 NOTE — Telephone Encounter (Signed)
Spoke with pharmacy and advised results   

## 2015-09-03 NOTE — Telephone Encounter (Signed)
[name] with [facility] left v/m; pt taking prednisone for the last 2 months and [name] request cb still OK for pt to get tdap injection.

## 2015-09-03 NOTE — Telephone Encounter (Signed)
Yes--it is a killed vaccine

## 2015-10-15 ENCOUNTER — Other Ambulatory Visit: Payer: Self-pay | Admitting: Internal Medicine

## 2015-10-15 NOTE — Telephone Encounter (Signed)
Please change inst to take 2 per day with breakfast  Refill times one  Cc to Dr Silvio Pate so he is aware as well -thanks

## 2015-10-15 NOTE — Telephone Encounter (Signed)
Last OV and SED Rate labs 08-27-15. Next OV 03-02-16. Last SED Rate note states to take 2 prednisone a day. Last refill 07-04-15 #100 Forward to Dr Glori Bickers in Dr Alla German absence.

## 2015-10-15 NOTE — Telephone Encounter (Signed)
The dosage had already been corrected in the medication list. I corrected the rx and sent it to the pharmacy. Forwarding to Dr Silvio Pate as Juluis Rainier.

## 2015-11-18 ENCOUNTER — Other Ambulatory Visit: Payer: Self-pay

## 2015-11-18 MED ORDER — OXYBUTYNIN CHLORIDE 5 MG PO TABS: ORAL_TABLET | ORAL | Status: DC

## 2015-11-18 NOTE — Telephone Encounter (Signed)
Rx sent electronically.  

## 2015-12-28 ENCOUNTER — Other Ambulatory Visit: Payer: Self-pay | Admitting: Internal Medicine

## 2015-12-30 NOTE — Telephone Encounter (Signed)
Last filled 10-15-15 #100 Last OV 08-27-15 Next OV 03-02-16

## 2015-12-30 NOTE — Telephone Encounter (Signed)
Approved:#100 x 1

## 2016-02-24 ENCOUNTER — Ambulatory Visit: Payer: Medicare Other | Admitting: Internal Medicine

## 2016-03-02 ENCOUNTER — Ambulatory Visit (INDEPENDENT_AMBULATORY_CARE_PROVIDER_SITE_OTHER): Payer: Medicare Other | Admitting: Internal Medicine

## 2016-03-02 ENCOUNTER — Encounter: Payer: Self-pay | Admitting: Internal Medicine

## 2016-03-02 VITALS — BP 136/86 | HR 52 | Temp 97.6°F | Wt 193.0 lb

## 2016-03-02 DIAGNOSIS — M353 Polymyalgia rheumatica: Secondary | ICD-10-CM | POA: Diagnosis not present

## 2016-03-02 DIAGNOSIS — J069 Acute upper respiratory infection, unspecified: Secondary | ICD-10-CM | POA: Insufficient documentation

## 2016-03-02 DIAGNOSIS — K4090 Unilateral inguinal hernia, without obstruction or gangrene, not specified as recurrent: Secondary | ICD-10-CM

## 2016-03-02 LAB — SEDIMENTATION RATE: SED RATE: 7 mm/h (ref 0–20)

## 2016-03-02 NOTE — Assessment & Plan Note (Signed)
Increasing symptoms Will set up evaluation with general surgeon

## 2016-03-02 NOTE — Assessment & Plan Note (Signed)
Remains asymptomatic Will wean the prednisone to 10/5 every other day

## 2016-03-02 NOTE — Assessment & Plan Note (Signed)
Seems viral Discussed supportive care only

## 2016-03-02 NOTE — Progress Notes (Signed)
Pre visit review using our clinic review tool, if applicable. No additional management support is needed unless otherwise documented below in the visit note. 

## 2016-03-02 NOTE — Patient Instructions (Signed)
Please take 2 prednisone 5mg  tabs on odd days, and only 1 tab on even days
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
## 2016-03-14 ENCOUNTER — Emergency Department: Payer: Medicare Other

## 2016-03-14 ENCOUNTER — Emergency Department
Admission: EM | Admit: 2016-03-14 | Discharge: 2016-03-14 | Disposition: A | Discharge status: Home / Self Care | Payer: Medicare Other | Attending: Student in an Organized Health Care Education/Training Program | Admitting: Student in an Organized Health Care Education/Training Program

## 2016-03-14 ENCOUNTER — Encounter: Payer: Self-pay | Admitting: Emergency Medicine

## 2016-03-14 DIAGNOSIS — I10 Essential (primary) hypertension: Secondary | ICD-10-CM | POA: Insufficient documentation

## 2016-03-14 DIAGNOSIS — Y9271 Barn as the place of occurrence of the external cause: Secondary | ICD-10-CM | POA: Insufficient documentation

## 2016-03-14 DIAGNOSIS — S0990XA Unspecified injury of head, initial encounter: Secondary | ICD-10-CM | POA: Diagnosis not present

## 2016-03-14 DIAGNOSIS — R102 Pelvic and perineal pain unspecified side: Secondary | ICD-10-CM

## 2016-03-14 DIAGNOSIS — W1809XA Striking against other object with subsequent fall, initial encounter: Secondary | ICD-10-CM | POA: Diagnosis not present

## 2016-03-14 DIAGNOSIS — Y999 Unspecified external cause status: Secondary | ICD-10-CM | POA: Insufficient documentation

## 2016-03-14 DIAGNOSIS — S59901A Unspecified injury of right elbow, initial encounter: Secondary | ICD-10-CM | POA: Diagnosis not present

## 2016-03-14 DIAGNOSIS — M25551 Pain in right hip: Secondary | ICD-10-CM | POA: Diagnosis not present

## 2016-03-14 DIAGNOSIS — M542 Cervicalgia: Secondary | ICD-10-CM | POA: Diagnosis not present

## 2016-03-14 DIAGNOSIS — S299XXA Unspecified injury of thorax, initial encounter: Secondary | ICD-10-CM | POA: Diagnosis not present

## 2016-03-14 DIAGNOSIS — Y9389 Activity, other specified: Secondary | ICD-10-CM | POA: Diagnosis not present

## 2016-03-14 DIAGNOSIS — Y9279 Other farm location as the place of occurrence of the external cause: Secondary | ICD-10-CM

## 2016-03-14 DIAGNOSIS — S3993XA Unspecified injury of pelvis, initial encounter: Secondary | ICD-10-CM | POA: Diagnosis not present

## 2016-03-14 DIAGNOSIS — S79921A Unspecified injury of right thigh, initial encounter: Secondary | ICD-10-CM | POA: Diagnosis not present

## 2016-03-14 DIAGNOSIS — S199XXA Unspecified injury of neck, initial encounter: Secondary | ICD-10-CM | POA: Diagnosis not present

## 2016-03-14 DIAGNOSIS — R079 Chest pain, unspecified: Secondary | ICD-10-CM | POA: Diagnosis not present

## 2016-03-14 LAB — URINALYSIS COMPLETE WITH MICROSCOPIC (ARMC ONLY)
BACTERIA UA: NONE SEEN
Bilirubin Urine: NEGATIVE
GLUCOSE, UA: NEGATIVE mg/dL
HGB URINE DIPSTICK: NEGATIVE
Ketones, ur: NEGATIVE mg/dL
Leukocytes, UA: NEGATIVE
NITRITE: NEGATIVE
PROTEIN: NEGATIVE mg/dL
Specific Gravity, Urine: 1.017 (ref 1.005–1.030)
pH: 5 (ref 5.0–8.0)

## 2016-03-14 LAB — CBC
HEMATOCRIT: 42.9 % (ref 40.0–52.0)
HEMOGLOBIN: 14.7 g/dL (ref 13.0–18.0)
MCH: 31 pg (ref 26.0–34.0)
MCHC: 34.3 g/dL (ref 32.0–36.0)
MCV: 90.5 fL (ref 80.0–100.0)
Platelets: 190 10*3/uL (ref 150–440)
RBC: 4.74 MIL/uL (ref 4.40–5.90)
RDW: 14.1 % (ref 11.5–14.5)
WBC: 11.8 10*3/uL — ABNORMAL HIGH (ref 3.8–10.6)

## 2016-03-14 LAB — HEPATIC FUNCTION PANEL
ALBUMIN: 3.8 g/dL (ref 3.5–5.0)
ALK PHOS: 74 U/L (ref 38–126)
ALT: 16 U/L — ABNORMAL LOW (ref 17–63)
AST: 23 U/L (ref 15–41)
BILIRUBIN INDIRECT: 0.8 mg/dL (ref 0.3–0.9)
Bilirubin, Direct: 0.1 mg/dL (ref 0.1–0.5)
TOTAL PROTEIN: 7.6 g/dL (ref 6.5–8.1)
Total Bilirubin: 0.9 mg/dL (ref 0.3–1.2)

## 2016-03-14 LAB — BASIC METABOLIC PANEL
ANION GAP: 9 (ref 5–15)
BUN: 17 mg/dL (ref 6–20)
CO2: 26 mmol/L (ref 22–32)
Calcium: 9.4 mg/dL (ref 8.9–10.3)
Chloride: 101 mmol/L (ref 101–111)
Creatinine, Ser: 0.62 mg/dL (ref 0.61–1.24)
GFR calc Af Amer: >60 mL/min (ref >60)
GLUCOSE: 123 mg/dL — AB (ref 65–99)
POTASSIUM: 4.5 mmol/L (ref 3.5–5.1)
Sodium: 136 mmol/L (ref 135–145)

## 2016-03-14 MED ORDER — DICLOFENAC SODIUM 1 % TD GEL: 2.0000 g | Freq: Two times a day (BID) | TRANSDERMAL | 0 refills | Status: DC | PRN

## 2016-03-14 MED ORDER — BACITRACIN ZINC 500 UNIT/GM EX OINT
TOPICAL_OINTMENT | Freq: Once | CUTANEOUS | Status: AC
  Administered 2016-03-14: 1 via TOPICAL
  Filled 2016-03-14: qty 0.9

## 2016-03-14 MED ORDER — HYDROCODONE-ACETAMINOPHEN 5-325 MG PO TABS: 1.0000 | ORAL_TABLET | Freq: Every evening | ORAL | 0 refills | Status: DC | PRN

## 2016-03-14 MED ORDER — FENTANYL CITRATE (PF) 100 MCG/2ML IJ SOLN
50.0000 ug | INTRAMUSCULAR | Status: DC | PRN
  Administered 2016-03-14 (×2): 50 ug via INTRAVENOUS
  Filled 2016-03-14 (×2): qty 2

## 2016-03-14 MED ORDER — ACETAMINOPHEN 500 MG PO TABS
1000.0000 mg | ORAL_TABLET | Freq: Once | ORAL | Status: AC
  Administered 2016-03-14: 1000 mg via ORAL
  Filled 2016-03-14: qty 2

## 2016-03-14 NOTE — ED Notes (Signed)
Bruising noted to right hip and skin tears to left elbow. Dried scabs to head.

## 2016-03-14 NOTE — ED Notes (Signed)
Patient transported to X-ray 
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
## 2016-03-14 NOTE — ED Triage Notes (Signed)
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  Pt also states he fell on Wednesday last week and c/o hip pain. States hip swelled up like a basketball. Able to ambulate some at home with a walker.  Pt is alert and oriented. Denies LOC. Pt states after hitting his head he laid down for about 5 minutes before he got up.

## 2016-03-22 ENCOUNTER — Ambulatory Visit (INDEPENDENT_AMBULATORY_CARE_PROVIDER_SITE_OTHER): Payer: Medicare Other | Admitting: Internal Medicine

## 2016-03-22 ENCOUNTER — Encounter: Payer: Self-pay | Admitting: Internal Medicine

## 2016-03-22 DIAGNOSIS — S300XXA Contusion of lower back and pelvis, initial encounter: Secondary | ICD-10-CM | POA: Diagnosis not present

## 2016-03-22 DIAGNOSIS — N432 Other hydrocele: Secondary | ICD-10-CM | POA: Diagnosis not present

## 2016-03-22 DIAGNOSIS — K4090 Unilateral inguinal hernia, without obstruction or gangrene, not specified as recurrent: Secondary | ICD-10-CM | POA: Diagnosis not present

## 2016-03-22 MED ORDER — HYDROCODONE-ACETAMINOPHEN 5-325 MG PO TABS: 1.0000 | ORAL_TABLET | Freq: Every evening | ORAL | 0 refills | Status: DC | PRN

## 2016-03-22 NOTE — Progress Notes (Signed)
Pre visit review using our clinic review tool, if applicable. No additional management support is needed unless otherwise documented below in the visit note. 

## 2016-03-22 NOTE — Assessment & Plan Note (Signed)
Fairly severe X-rays negative Walks okay Probably should stop ice Will renew hydrocodone

## 2016-03-22 NOTE — Assessment & Plan Note (Signed)
Worse He is going for surgical evaluation
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
## 2016-03-28 ENCOUNTER — Other Ambulatory Visit: Payer: Self-pay | Admitting: Internal Medicine

## 2016-03-29 NOTE — Telephone Encounter (Signed)
Our directions state 10mg  on odd days 5mg  on even days. The rx request says 1 twice a day.  Last OV Acute 03-22-16 Next OV 09-03-16

## 2016-03-29 NOTE — Telephone Encounter (Signed)
Tried to call pt . No answer.  

## 2016-03-29 NOTE — Telephone Encounter (Signed)
Approved: I would have weaned him down Confirm dose with him--then change to 5mg  tabs (2 alternating with 1)

## 2016-03-30 ENCOUNTER — Encounter
Admission: RE | Admit: 2016-03-30 | Discharge: 2016-03-30 | Disposition: A | Discharge status: Home / Self Care | Payer: Medicare Other | Source: Ambulatory Visit | Attending: Surgery | Admitting: Surgery

## 2016-03-30 DIAGNOSIS — Z01810 Encounter for preprocedural cardiovascular examination: Secondary | ICD-10-CM | POA: Diagnosis not present

## 2016-03-30 DIAGNOSIS — K4090 Unilateral inguinal hernia, without obstruction or gangrene, not specified as recurrent: Secondary | ICD-10-CM | POA: Diagnosis not present

## 2016-03-30 DIAGNOSIS — Z01818 Encounter for other preprocedural examination: Secondary | ICD-10-CM | POA: Diagnosis not present

## 2016-03-30 HISTORY — DX: Other complications of anesthesia, initial encounter: T88.59XA

## 2016-03-30 HISTORY — DX: Adverse effect of unspecified anesthetic, initial encounter: T41.45XA

## 2016-03-30 NOTE — Patient Instructions (Signed)
  Your procedure is scheduled on: 04/09/16 Fri Report to Same Day Surgery 2nd floor medical mall To find out your arrival time please call [phone] between 1PM - 3PM on 04/08/16 Thurs  Remember: Instructions that are not followed completely may result in serious medical risk, up to and including death, or upon the discretion of your surgeon and anesthesiologist your surgery may need to be rescheduled.    _x___ 1. Do not eat food or drink liquids after midnight. No gum chewing or hard candies.     __x__ 2. No Alcohol for 24 hours before or after surgery.   __x__3. No Smoking for 24 prior to surgery.   ____  4. Bring all medications with you on the day of surgery if instructed.    __x__ 5. Notify your doctor if there is any change in your medical condition     (cold, fever, infections).     Do not wear jewelry, make-up, hairpins, clips or nail polish.  Do not wear lotions, powders, or perfumes. You may wear deodorant.  Do not shave 48 hours prior to surgery. Men may shave face and neck.  Do not bring valuables to the hospital.    [facility] is not responsible for any belongings or valuables.               Contacts, dentures or bridgework may not be worn into surgery.  Leave your suitcase in the car. After surgery it may be brought to your room.  For patients admitted to the hospital, discharge time is determined by your treatment team.   Patients discharged the day of surgery will not be allowed to drive home.    Please read over the following fact sheets that you were given:   [facility] Preparing for Surgery and or MRSA Information   _x___ Take these medicines the morning of surgery with A SIP OF WATER:    1. predniSONE (DELTASONE) 5 MG tablet  2.  3.  4.  5.  6.  ____ Fleet Enema (as directed)   _x___ Use CHG Soap or sage wipes as directed on instruction sheet   ____ Use inhalers on the day of surgery and bring to hospital day of surgery  ____ Stop metformin 2 days  prior to surgery    ____ Take 1/2 of usual insulin dose the night before surgery and none on the morning of           surgery.   ____ Stop aspirin or coumadin, or plavix  _x__ Stop Anti-inflammatories such as Advil, Aleve, Ibuprofen, Motrin, Naproxen,          Naprosyn, Goodies powders or aspirin products. Ok to take Tylenol.   ____ Stop supplements until after surgery.    ____ Bring C-Pap to the hospital.

## 2016-04-02 DIAGNOSIS — T884XXA Failed or difficult intubation, initial encounter: Secondary | ICD-10-CM

## 2016-04-02 HISTORY — DX: Failed or difficult intubation, initial encounter: T88.4XXA

## 2016-04-02 NOTE — Telephone Encounter (Signed)
Mrs Hermanns left v/m requesting cb about prednisone refill.

## 2016-04-06 NOTE — Telephone Encounter (Signed)
Spoke to pt in office. Verified his dosage and sent in rx

## 2016-04-08 DIAGNOSIS — Z96651 Presence of right artificial knee joint: Secondary | ICD-10-CM | POA: Diagnosis not present

## 2016-04-08 DIAGNOSIS — M25561 Pain in right knee: Secondary | ICD-10-CM | POA: Diagnosis not present

## 2016-04-09 ENCOUNTER — Ambulatory Visit: Payer: Medicare Other | Admitting: Anesthesiology

## 2016-04-09 ENCOUNTER — Ambulatory Visit
Admission: RE | Admit: 2016-04-09 | Discharge: 2016-04-09 | Disposition: A | Discharge status: Home / Self Care | Payer: Medicare Other | Source: Ambulatory Visit | Attending: Surgery | Admitting: Surgery

## 2016-04-09 ENCOUNTER — Encounter
Admission: RE | Disposition: A | Discharge status: Home / Self Care | Payer: Self-pay | Source: Ambulatory Visit | Attending: Surgery

## 2016-04-09 DIAGNOSIS — K4090 Unilateral inguinal hernia, without obstruction or gangrene, not specified as recurrent: Secondary | ICD-10-CM | POA: Diagnosis not present

## 2016-04-09 DIAGNOSIS — Z79899 Other long term (current) drug therapy: Secondary | ICD-10-CM | POA: Insufficient documentation

## 2016-04-09 DIAGNOSIS — I10 Essential (primary) hypertension: Secondary | ICD-10-CM | POA: Diagnosis not present

## 2016-04-09 DIAGNOSIS — M1990 Unspecified osteoarthritis, unspecified site: Secondary | ICD-10-CM | POA: Insufficient documentation

## 2016-04-09 DIAGNOSIS — Z7982 Long term (current) use of aspirin: Secondary | ICD-10-CM | POA: Insufficient documentation

## 2016-04-09 HISTORY — PX: INGUINAL HERNIA REPAIR: SHX194

## 2016-04-09 HISTORY — DX: Failed or difficult intubation, initial encounter: T88.4XXA

## 2016-04-09 SURGERY — REPAIR, HERNIA, INGUINAL, ADULT
Anesthesia: General | Site: Inguinal | Laterality: Right | Wound class: Clean

## 2016-04-09 MED ORDER — ROCURONIUM BROMIDE 100 MG/10ML IV SOLN
INTRAVENOUS | Status: DC | PRN
  Administered 2016-04-09: 25 mg via INTRAVENOUS

## 2016-04-09 MED ORDER — FENTANYL CITRATE (PF) 100 MCG/2ML IJ SOLN
INTRAMUSCULAR | Status: DC | PRN
  Administered 2016-04-09: 25 ug via INTRAVENOUS
  Administered 2016-04-09: 50 ug via INTRAVENOUS
  Administered 2016-04-09: 25 ug via INTRAVENOUS

## 2016-04-09 MED ORDER — BUPIVACAINE-EPINEPHRINE (PF) 0.5% -1:200000 IJ SOLN
INTRAMUSCULAR | Status: AC
Start: 2016-04-09 — End: 2016-04-09
  Filled 2016-04-09: qty 30

## 2016-04-09 MED ORDER — FAMOTIDINE 20 MG PO TABS
20.0000 mg | ORAL_TABLET | Freq: Once | ORAL | Status: AC
  Administered 2016-04-09: 20 mg via ORAL

## 2016-04-09 MED ORDER — PROPOFOL 10 MG/ML IV BOLUS
INTRAVENOUS | Status: DC | PRN
  Administered 2016-04-09: 160 mg via INTRAVENOUS

## 2016-04-09 MED ORDER — ONDANSETRON HCL 4 MG/2ML IJ SOLN
INTRAMUSCULAR | Status: DC | PRN
  Administered 2016-04-09: 4 mg via INTRAVENOUS

## 2016-04-09 MED ORDER — HYDROCODONE-ACETAMINOPHEN 5-325 MG PO TABS: 1.0000 | ORAL_TABLET | ORAL | Status: DC | PRN

## 2016-04-09 MED ORDER — MIDAZOLAM HCL 2 MG/2ML IJ SOLN
INTRAMUSCULAR | Status: DC | PRN
  Administered 2016-04-09: 1 mg via INTRAVENOUS

## 2016-04-09 MED ORDER — HYDROMORPHONE HCL 1 MG/ML IJ SOLN
0.2500 mg | INTRAMUSCULAR | Status: DC | PRN
  Administered 2016-04-09 (×2): 0.25 mg via INTRAVENOUS
  Administered 2016-04-09: 0.5 mg via INTRAVENOUS

## 2016-04-09 MED ORDER — OXYCODONE HCL 5 MG/5ML PO SOLN: 5.0000 mg | Freq: Once | ORAL | Status: DC | PRN

## 2016-04-09 MED ORDER — HYDROCODONE-ACETAMINOPHEN 5-325 MG PO TABS
1.0000 | ORAL_TABLET | Freq: Once | ORAL | Status: AC
  Administered 2016-04-09: 1 via ORAL

## 2016-04-09 MED ORDER — GLYCOPYRROLATE 0.2 MG/ML IJ SOLN
INTRAMUSCULAR | Status: DC | PRN
  Administered 2016-04-09: 0.4 mg via INTRAVENOUS
  Administered 2016-04-09: 0.2 mg via INTRAVENOUS

## 2016-04-09 MED ORDER — DEXAMETHASONE SODIUM PHOSPHATE 10 MG/ML IJ SOLN
INTRAMUSCULAR | Status: DC | PRN
  Administered 2016-04-09: 5 mg via INTRAVENOUS

## 2016-04-09 MED ORDER — FAMOTIDINE 20 MG PO TABS
ORAL_TABLET | ORAL | Status: AC
  Administered 2016-04-09: 20 mg via ORAL
  Filled 2016-04-09: qty 1

## 2016-04-09 MED ORDER — LACTATED RINGERS IV SOLN
INTRAVENOUS | Status: DC
  Administered 2016-04-09 (×2): via INTRAVENOUS

## 2016-04-09 MED ORDER — FENTANYL CITRATE (PF) 100 MCG/2ML IJ SOLN
25.0000 ug | INTRAMUSCULAR | Status: DC | PRN
  Administered 2016-04-09 (×4): 25 ug via INTRAVENOUS

## 2016-04-09 MED ORDER — FENTANYL CITRATE (PF) 100 MCG/2ML IJ SOLN
INTRAMUSCULAR | Status: AC
  Filled 2016-04-09: qty 2

## 2016-04-09 MED ORDER — HYDROCODONE-ACETAMINOPHEN 5-325 MG PO TABS
ORAL_TABLET | ORAL | Status: AC
  Filled 2016-04-09: qty 1

## 2016-04-09 MED ORDER — HYDROMORPHONE HCL 1 MG/ML IJ SOLN
INTRAMUSCULAR | Status: AC
  Filled 2016-04-09: qty 1

## 2016-04-09 MED ORDER — CEFAZOLIN SODIUM-DEXTROSE 2-4 GM/100ML-% IV SOLN
2.0000 g | Freq: Once | INTRAVENOUS | Status: AC
  Administered 2016-04-09: 2 g via INTRAVENOUS

## 2016-04-09 MED ORDER — LIDOCAINE HCL (CARDIAC) 20 MG/ML IV SOLN
INTRAVENOUS | Status: DC | PRN
  Administered 2016-04-09: 40 mg via INTRAVENOUS

## 2016-04-09 MED ORDER — SUCCINYLCHOLINE CHLORIDE 20 MG/ML IJ SOLN
INTRAMUSCULAR | Status: DC | PRN
  Administered 2016-04-09: 120 mg via INTRAVENOUS

## 2016-04-09 MED ORDER — OXYCODONE HCL 5 MG PO TABS: 5.0000 mg | ORAL_TABLET | Freq: Once | ORAL | Status: DC | PRN

## 2016-04-09 MED ORDER — BUPIVACAINE-EPINEPHRINE (PF) 0.5% -1:200000 IJ SOLN
INTRAMUSCULAR | Status: DC | PRN
  Administered 2016-04-09: 20 mL

## 2016-04-09 MED ORDER — NEOSTIGMINE METHYLSULFATE 10 MG/10ML IV SOLN
INTRAVENOUS | Status: DC | PRN
  Administered 2016-04-09: 2 mg via INTRAVENOUS

## 2016-04-09 MED ORDER — HYDROCODONE-ACETAMINOPHEN 5-325 MG PO TABS: 1.0000 | ORAL_TABLET | ORAL | 0 refills | Status: DC | PRN

## 2016-04-09 SURGICAL SUPPLY — 27 items
BLADE SURG 15 STRL LF DISP TIS (BLADE) ×1 IMPLANT
BLADE SURG 15 STRL SS (BLADE) ×2
CANISTER SUCT 1200ML W/VALVE (MISCELLANEOUS) ×3 IMPLANT
CHLORAPREP W/TINT 26ML (MISCELLANEOUS) ×3 IMPLANT
DERMABOND ADVANCED (GAUZE/BANDAGES/DRESSINGS) ×2
DERMABOND ADVANCED .7 DNX12 (GAUZE/BANDAGES/DRESSINGS) ×1 IMPLANT
DRAIN PENROSE 5/8X18 LTX STRL (WOUND CARE) ×3 IMPLANT
DRAPE LAPAROTOMY 77X122 PED (DRAPES) ×3 IMPLANT
ELECT REM PT RETURN 9FT ADLT (ELECTROSURGICAL) ×3
ELECTRODE REM PT RTRN 9FT ADLT (ELECTROSURGICAL) ×1 IMPLANT
GLOVE BIO SURGEON STRL SZ7.5 (GLOVE) ×3 IMPLANT
GOWN STRL REUS W/ TWL LRG LVL3 (GOWN DISPOSABLE) ×3 IMPLANT
GOWN STRL REUS W/TWL LRG LVL3 (GOWN DISPOSABLE) ×6
KIT RM TURNOVER STRD PROC AR (KITS) ×3 IMPLANT
LABEL OR SOLS (LABEL) ×3 IMPLANT
LIQUID BAND (GAUZE/BANDAGES/DRESSINGS) ×3 IMPLANT
MESH SYNTHETIC 4X6 SOFT BARD (Mesh General) ×1 IMPLANT
MESH SYNTHETIC SOFT BARD 4X6 (Mesh General) ×2 IMPLANT
NEEDLE HYPO 25X1 1.5 SAFETY (NEEDLE) ×3 IMPLANT
NS IRRIG 500ML POUR BTL (IV SOLUTION) ×3 IMPLANT
PACK BASIN MINOR ARMC (MISCELLANEOUS) ×3 IMPLANT
SUT CHROMIC 4 0 RB 1X27 (SUTURE) ×3 IMPLANT
SUT MNCRL AB 4-0 PS2 18 (SUTURE) ×3 IMPLANT
SUT SURGILON 0 30 BLK (SUTURE) ×6 IMPLANT
SUT VIC AB 4-0 SH 27 (SUTURE) ×2
SUT VIC AB 4-0 SH 27XANBCTRL (SUTURE) ×1 IMPLANT
SYRINGE 10CC LL (SYRINGE) ×3 IMPLANT

## 2016-04-09 NOTE — Transfer of Care (Signed)
Immediate Anesthesia Transfer of Care Note  Patient: [name]  Procedure(s) Performed: Procedure(s): HERNIA REPAIR INGUINAL ADULT (Right)  Patient Location: PACU  Anesthesia Type:General  Level of Consciousness: awake, alert  and oriented  Airway & Oxygen Therapy: Patient Spontanous Breathing and Patient connected to face mask oxygen  Post-op Assessment: Report given to RN and Post -op Vital signs reviewed and stable  Post vital signs: Reviewed and stable  Last Vitals:  Vitals:   04/09/16 0603  BP: (!) 156/95  Pulse: (!) 56  Resp: 16  Temp: 36.3 C    Last Pain:  Vitals:   04/09/16 0603  TempSrc: Tympanic         Complications: No apparent anesthesia complications

## 2016-04-09 NOTE — Anesthesia Procedure Notes (Addendum)
Procedure Name: Intubation Date/Time: 04/09/2016 7:59 AM Performed by: Vaughan Sine Pre-anesthesia Checklist: Patient identified, Emergency Drugs available, Suction available, Patient being monitored and Timeout performed Patient Re-evaluated:Patient Re-evaluated prior to inductionOxygen Delivery Method: Circle system utilized Preoxygenation: Pre-oxygenation with 100% oxygen Intubation Type: IV induction Ventilation: Mask ventilation without difficulty Laryngoscope Size: Glidescope and 4 Grade View: Grade II Tube type: Oral Tube size: 7.5 mm Number of attempts: 2 (First by CRNA 2nd by MD, patient very anterior) Airway Equipment and Method: Rigid stylet Placement Confirmation: positive ETCO2,  CO2 detector,  breath sounds checked- equal and bilateral and ETT inserted through vocal cords under direct vision Secured at: 22 cm Dental Injury: Teeth and Oropharynx as per pre-operative assessment  Difficulty Due To: Difficulty was anticipated, Difficult Airway- due to dentition, Difficult Airway- due to reduced neck mobility and Difficult Airway- due to anterior larynx Future Recommendations: Recommend- induction with short-acting agent, and alternative techniques readily available Comments: Patient has very poor dentition with many chipped and broken teeth at baseline.  Glidescope used electively but patient has a difficult airway, very anterior

## 2016-04-09 NOTE — Discharge Instructions (Signed)
Take Tylenol or Norco if needed for pain.  Resume aspirin on Sunday.  May shower.  Avoid straining and heavy lifting.      AMBULATORY SURGERY  DISCHARGE INSTRUCTIONS   1) The drugs that you were given will stay in your system until tomorrow so for the next 24 hours you should not:  A) Drive an automobile B) Make any legal decisions C) Drink any alcoholic beverage   2) You may resume regular meals tomorrow.  Today it is better to start with liquids and gradually work up to solid foods.  You may eat anything you prefer, but it is better to start with liquids, then soup and crackers, and gradually work up to solid foods.   3) Please notify your doctor immediately if you have any unusual bleeding, trouble breathing, redness and pain at the surgery site, drainage, fever, or pain not relieved by medication.    4) Additional Instructions:        Please contact your physician with any problems or Same Day Surgery at [phone], Monday through Friday 6 am to 4 pm, or [place] at [facility] number at [phone].

## 2016-04-09 NOTE — Anesthesia Postprocedure Evaluation (Signed)
Anesthesia Post Note  Patient: [name]  Procedure(s) Performed: Procedure(s) (LRB): HERNIA REPAIR INGUINAL ADULT (Right)  Patient location during evaluation: PACU Anesthesia Type: General Level of consciousness: awake and alert Pain management: pain level controlled Vital Signs Assessment: post-procedure vital signs reviewed and stable Respiratory status: spontaneous breathing, nonlabored ventilation, respiratory function stable and patient connected to nasal cannula oxygen Cardiovascular status: blood pressure returned to baseline and stable Postop Assessment: no signs of nausea or vomiting Anesthetic complications: no    Last Vitals:  Vitals:   04/09/16 1030 04/09/16 1046  BP: (!) 167/92 (!) 164/83  Pulse: 64 60  Resp: 19 16  Temp: 36.4 C (!) 35.6 C    Last Pain:  Vitals:   04/09/16 1046  TempSrc: Tympanic  PainSc: 8                  Edgar Corrigan K Marketta Valadez
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
## 2016-04-09 NOTE — Op Note (Signed)
OPERATIVE REPORT  PREOPERATIVE DIAGNOSIS: right inguinal hernia  POSTOPERATIVE DIAGNOSIS:right  inguinal hernia  PROCEDURE:  right inguinal hernia repair  ANESTHESIA:  General  SURGEON:  Rochel Brome M.D.  INDICATIONS: He has a history of bulging in the right groin and moderate discomfort. A right inguinal hernia was demonstrated on physical exam and repair was recommended for definitive treatment.  With the patient on the operating table in the supine position the right lower quadrant was prepared with clippers and with ChloraPrep and draped in a sterile manner. A transversely oriented suprapubic incision was made and carried down through subcutaneous tissues. Electrocautery was used for hemostasis. The Scarpa's fascia was incised. The external oblique aponeurosis was incised along the course of its fibers to open the external ring and expose the inguinal cord structures. The cord structures were mobilized. A Penrose drain was passed around the cord structures for traction. Cremaster fibers were separated to expose an indirect hernia sac. The sac was dissected free from surrounding structures up to the internal ring. The sac was opened and its continuity with the peritoneal cavity was demonstrated. A high ligation of the sac was done with a 4-0 Vicryl suture ligature. The sac was excised and was not submitted for pathology. The stump was allowed to retract. The repair was carried out with 0 Surgilon suturing the conjoined tendon to the shelving edge of the inguinal ligament incorporating transversalis fascia into the repair. The last its lead to satisfactory narrowing of the internal ring. Bard soft mesh was cut to create an oval shape and was placed over the repair. This was sutured to the repair with interrupted 0 Surgilon sutures and also sutured medially to the deep fascia and on both sides of the internal ring. Next after seeing hemostasis was intact the cord structures were replaced along the  floor of the inguinal canal. The cut edges of the external oblique aponeurosis were closed with a running 4-0 Vicryl suture to re-create the external ring. The deep fascia superior and lateral to the repair site was infiltrated with half percent Sensorcaine with epinephrine. Subcutaneous tissues were also infiltrated. The Scarpa's fascia was closed with interrupted 4-0 Vicryl sutures. The skin was closed with running 4-0 Monocryl subcuticular suture and LiquiBand. The testicle remained in the scrotum  The patient appeared to be in satisfactory condition and was prepared for transfer to the recovery room.  Rochel Brome M.D.

## 2016-04-09 NOTE — H&P (Signed)
  He comes in today for right inguinal hernia repair. He reports no change in overall condition since the day of the office visit.  Lab work was reviewed.  The right side was marked YES.  I discussed the plan for inguinal hernia repair and postoperative care

## 2016-05-12 ENCOUNTER — Ambulatory Visit (INDEPENDENT_AMBULATORY_CARE_PROVIDER_SITE_OTHER): Payer: Medicare Other | Admitting: Orthopaedic Surgery

## 2016-05-12 DIAGNOSIS — M25561 Pain in right knee: Secondary | ICD-10-CM | POA: Diagnosis not present

## 2016-05-12 DIAGNOSIS — Z96651 Presence of right artificial knee joint: Secondary | ICD-10-CM | POA: Diagnosis not present

## 2016-05-27 DIAGNOSIS — Z23 Encounter for immunization: Secondary | ICD-10-CM | POA: Diagnosis not present

## 2016-06-08 ENCOUNTER — Other Ambulatory Visit: Payer: Self-pay

## 2016-06-08 MED ORDER — FINASTERIDE 5 MG PO TABS: 5.0000 mg | ORAL_TABLET | Freq: Every day | ORAL | 1 refills | Status: DC

## 2016-06-08 NOTE — Telephone Encounter (Signed)
Mrs [name] request refill finasteride to [facility] mail order. Pt had annual exam on 08/27/15. Refill done per protocol and Mrs [name] voiced understanding.

## 2016-06-22 ENCOUNTER — Other Ambulatory Visit: Payer: Self-pay | Admitting: Internal Medicine

## 2016-07-27 ENCOUNTER — Other Ambulatory Visit: Payer: Self-pay | Admitting: Internal Medicine

## 2016-08-13 ENCOUNTER — Other Ambulatory Visit: Payer: Self-pay

## 2016-08-13 MED ORDER — TAMSULOSIN HCL 0.4 MG PO CAPS: 0.4000 mg | ORAL_CAPSULE | Freq: Every day | ORAL | 3 refills | Status: DC

## 2016-08-13 NOTE — Telephone Encounter (Signed)
Rx sent electronically.  

## 2016-08-24 MED ORDER — TAMSULOSIN HCL 0.4 MG PO CAPS: 0.4000 mg | ORAL_CAPSULE | Freq: Every day | ORAL | 3 refills | Status: DC

## 2016-08-24 NOTE — Addendum Note (Signed)
Addended by: Helene Shoe on: 08/24/2016 12:58 PM   Modules accepted: Orders

## 2016-08-24 NOTE — Telephone Encounter (Signed)
[facility] mail service request refill tamsulosin; done as requested.

## 2016-09-03 ENCOUNTER — Ambulatory Visit (INDEPENDENT_AMBULATORY_CARE_PROVIDER_SITE_OTHER): Payer: Medicare Other | Admitting: Internal Medicine

## 2016-09-03 ENCOUNTER — Encounter: Payer: Self-pay | Admitting: Internal Medicine

## 2016-09-03 VITALS — BP 132/86 | HR 59 | Temp 97.5°F | Ht 70.0 in | Wt 195.0 lb

## 2016-09-03 DIAGNOSIS — M159 Polyosteoarthritis, unspecified: Secondary | ICD-10-CM

## 2016-09-03 DIAGNOSIS — N138 Other obstructive and reflux uropathy: Secondary | ICD-10-CM

## 2016-09-03 DIAGNOSIS — I10 Essential (primary) hypertension: Secondary | ICD-10-CM | POA: Diagnosis not present

## 2016-09-03 DIAGNOSIS — Z0000 Encounter for general adult medical examination without abnormal findings: Secondary | ICD-10-CM

## 2016-09-03 DIAGNOSIS — M150 Primary generalized (osteo)arthritis: Secondary | ICD-10-CM | POA: Diagnosis not present

## 2016-09-03 DIAGNOSIS — N401 Enlarged prostate with lower urinary tract symptoms: Secondary | ICD-10-CM

## 2016-09-03 DIAGNOSIS — M353 Polymyalgia rheumatica: Secondary | ICD-10-CM | POA: Diagnosis not present

## 2016-09-03 LAB — COMPREHENSIVE METABOLIC PANEL WITH GFR
ALT: 15 U/L (ref 0–53)
AST: 21 U/L (ref 0–37)
Albumin: 4.3 g/dL (ref 3.5–5.2)
Alkaline Phosphatase: 82 U/L (ref 39–117)
BUN: 19 mg/dL (ref 6–23)
CO2: 32 meq/L (ref 19–32)
Calcium: 9.7 mg/dL (ref 8.4–10.5)
Chloride: 104 meq/L (ref 96–112)
Creatinine, Ser: 0.76 mg/dL (ref 0.40–1.50)
GFR: 104.99 mL/min
Glucose, Bld: 91 mg/dL (ref 70–99)
Potassium: 4.2 meq/L (ref 3.5–5.1)
Sodium: 141 meq/L (ref 135–145)
Total Bilirubin: 0.4 mg/dL (ref 0.2–1.2)
Total Protein: 7.2 g/dL (ref 6.0–8.3)

## 2016-09-03 LAB — CBC WITH DIFFERENTIAL/PLATELET
Basophils Absolute: 0.1 K/uL (ref 0.0–0.1)
Basophils Relative: 0.8 % (ref 0.0–3.0)
Eosinophils Absolute: 0.1 K/uL (ref 0.0–0.7)
Eosinophils Relative: 1.7 % (ref 0.0–5.0)
HCT: 44.3 % (ref 39.0–52.0)
Hemoglobin: 14.8 g/dL (ref 13.0–17.0)
Lymphocytes Relative: 26.1 % (ref 12.0–46.0)
Lymphs Abs: 1.7 K/uL (ref 0.7–4.0)
MCHC: 33.3 g/dL (ref 30.0–36.0)
MCV: 90.2 fl (ref 78.0–100.0)
Monocytes Absolute: 0.5 K/uL (ref 0.1–1.0)
Monocytes Relative: 8 % (ref 3.0–12.0)
Neutro Abs: 4.2 K/uL (ref 1.4–7.7)
Neutrophils Relative %: 63.4 % (ref 43.0–77.0)
Platelets: 187 K/uL (ref 150.0–400.0)
RBC: 4.91 Mil/uL (ref 4.22–5.81)
RDW: 14.4 % (ref 11.5–15.5)
WBC: 6.6 K/uL (ref 4.0–10.5)

## 2016-09-03 LAB — SEDIMENTATION RATE: SED RATE: 8 mm/h (ref 0–20)

## 2016-09-03 MED ORDER — HYDROCODONE-ACETAMINOPHEN 5-325 MG PO TABS: 1.0000 | ORAL_TABLET | Freq: Two times a day (BID) | ORAL | 0 refills | Status: DC | PRN

## 2016-09-03 NOTE — Assessment & Plan Note (Signed)
BP Readings from Last 3 Encounters:  09/03/16 132/86  04/09/16 (!) 169/93  03/30/16 114/68   Good control without meds

## 2016-09-03 NOTE — Assessment & Plan Note (Signed)
Seems to be quiet If sed rate normal--will decrease to 10mg  every other day

## 2016-09-03 NOTE — Assessment & Plan Note (Signed)
Ongoing symptoms but on fairly maximal medical therapy Continue current meds

## 2016-09-03 NOTE — Progress Notes (Signed)
Pre visit review using our clinic review tool, if applicable. No additional management support is needed unless otherwise documented below in the visit note. 

## 2016-09-03 NOTE — Assessment & Plan Note (Signed)
I have personally reviewed the Medicare Annual Wellness questionnaire and have noted 1. The patient's medical and social history 2. Their use of alcohol, tobacco or illicit drugs 3. Their current medications and supplements 4. The patient's functional ability including ADL's, fall risks, home safety risks and hearing or visual             impairment. 5. Diet and physical activities 6. Evidence for depression or mood disorders  The patients weight, height, BMI and visual acuity have been recorded in the chart I have made referrals, counseling and provided education to the patient based review of the above and I have provided the pt with a written personalized care plan for preventive services.  I have provided you with a copy of your personalized plan for preventive services. Please take the time to review along with your updated medication list.  No cancer screening due to age UTD on immunizations Discussed safety with tractor, etc

## 2016-09-03 NOTE — Assessment & Plan Note (Signed)
Mostly right shoulder now Will refill norco Back to Dr Louanne Skye if worsens Checked CSRS--nothing concerning
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
## 2016-09-06 ENCOUNTER — Telehealth: Payer: Self-pay

## 2016-09-06 NOTE — Telephone Encounter (Addendum)
Left message to call office.   Per lab results: The sed rate is normal Please decrease the prednisone to 10mg  every other day. Everything else was also normal--this is very reassuring.  I created the TE so I can update his med list after talking to him.

## 2016-09-07 NOTE — Telephone Encounter (Signed)
Spoke to pt. Corrected med list.

## 2016-10-24 ENCOUNTER — Other Ambulatory Visit: Payer: Self-pay | Admitting: Internal Medicine

## 2016-10-29 ENCOUNTER — Other Ambulatory Visit: Payer: Self-pay | Admitting: Internal Medicine

## 2016-11-02 ENCOUNTER — Other Ambulatory Visit: Payer: Self-pay | Admitting: Internal Medicine

## 2016-11-02 ENCOUNTER — Telehealth: Payer: Self-pay

## 2016-11-02 MED ORDER — PREDNISONE 10 MG PO TABS: 10.0000 mg | ORAL_TABLET | ORAL | 1 refills | Status: DC

## 2016-11-02 NOTE — Telephone Encounter (Signed)
Spoke to pt. We sent in a new rx to [facility]. He will contact [facility] nad have they to take him off of automatic refills

## 2016-11-02 NOTE — Telephone Encounter (Signed)
I found a note on my desk stating pt came by the office asking why he cannot get his prednisone through [facility]. In the pharmacy name it says [facility]. I have left a message on VM for the pt to call back. We keep receiving requests for 5mg  from [facility]. We approved a refill request form [facility] 11-01-16 for 5mg  take 2 tablets every other day. We can send a refill to [facility], if that is where he wants it. He needs to contact [facility] if he is not wanting to use them. Tell them to stop sending automatic refill requests to [place].

## 2016-11-12 ENCOUNTER — Ambulatory Visit (INDEPENDENT_AMBULATORY_CARE_PROVIDER_SITE_OTHER): Payer: Medicare Other

## 2016-11-12 ENCOUNTER — Ambulatory Visit (INDEPENDENT_AMBULATORY_CARE_PROVIDER_SITE_OTHER): Payer: Medicare Other | Admitting: Specialist

## 2016-11-12 VITALS — BP 133/76 | HR 70 | Ht 70.0 in | Wt 195.0 lb

## 2016-11-12 DIAGNOSIS — M25561 Pain in right knee: Secondary | ICD-10-CM

## 2016-11-12 DIAGNOSIS — M25562 Pain in left knee: Secondary | ICD-10-CM | POA: Diagnosis not present

## 2016-11-12 DIAGNOSIS — M25511 Pain in right shoulder: Secondary | ICD-10-CM | POA: Diagnosis not present
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
## 2016-11-22 ENCOUNTER — Ambulatory Visit (HOSPITAL_COMMUNITY)
Admission: RE | Admit: 2016-11-22 | Discharge: 2016-11-22 | Disposition: A | Discharge status: Home / Self Care | Payer: Medicare Other | Source: Ambulatory Visit | Attending: Surgery | Admitting: Surgery

## 2016-11-22 DIAGNOSIS — X58XXXA Exposure to other specified factors, initial encounter: Secondary | ICD-10-CM | POA: Diagnosis not present

## 2016-11-22 DIAGNOSIS — M19011 Primary osteoarthritis, right shoulder: Secondary | ICD-10-CM | POA: Insufficient documentation

## 2016-11-22 DIAGNOSIS — M75111 Incomplete rotator cuff tear or rupture of right shoulder, not specified as traumatic: Secondary | ICD-10-CM | POA: Diagnosis not present

## 2016-11-22 DIAGNOSIS — M75101 Unspecified rotator cuff tear or rupture of right shoulder, not specified as traumatic: Secondary | ICD-10-CM | POA: Diagnosis not present

## 2016-11-22 DIAGNOSIS — S46811A Strain of other muscles, fascia and tendons at shoulder and upper arm level, right arm, initial encounter: Secondary | ICD-10-CM | POA: Diagnosis not present

## 2016-11-22 DIAGNOSIS — M25511 Pain in right shoulder: Secondary | ICD-10-CM

## 2016-12-03 ENCOUNTER — Encounter (INDEPENDENT_AMBULATORY_CARE_PROVIDER_SITE_OTHER): Payer: Self-pay | Admitting: Orthopedic Surgery

## 2016-12-03 ENCOUNTER — Ambulatory Visit (INDEPENDENT_AMBULATORY_CARE_PROVIDER_SITE_OTHER): Payer: Medicare Other | Admitting: Specialist

## 2016-12-03 VITALS — BP 138/78 | HR 65 | Temp 97.3°F | Ht 70.0 in | Wt 196.0 lb

## 2016-12-03 DIAGNOSIS — M75121 Complete rotator cuff tear or rupture of right shoulder, not specified as traumatic: Secondary | ICD-10-CM

## 2016-12-03 DIAGNOSIS — M19011 Primary osteoarthritis, right shoulder: Secondary | ICD-10-CM

## 2016-12-03 MED ORDER — TRAMADOL-ACETAMINOPHEN 37.5-325 MG PO TABS: 1.0000 | ORAL_TABLET | Freq: Four times a day (QID) | ORAL | 0 refills | Status: DC | PRN
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
## 2016-12-03 NOTE — Patient Instructions (Signed)
Avoid overhead lifting and overhead use of the arms. Do not lift greater than 5-10  lbs. Go to PT for ROM, stretching and strengthening of the right shoulder. Come back and see Dr. Marlou Sa in 3-4 weeks to see if a shoulder replacement is worth considering.

## 2016-12-07 DIAGNOSIS — M25511 Pain in right shoulder: Secondary | ICD-10-CM | POA: Diagnosis not present

## 2016-12-10 DIAGNOSIS — M25511 Pain in right shoulder: Secondary | ICD-10-CM | POA: Diagnosis not present

## 2016-12-14 DIAGNOSIS — M25511 Pain in right shoulder: Secondary | ICD-10-CM | POA: Diagnosis not present

## 2016-12-15 ENCOUNTER — Other Ambulatory Visit: Payer: Self-pay

## 2016-12-15 MED ORDER — FINASTERIDE 5 MG PO TABS: 5.0000 mg | ORAL_TABLET | Freq: Every day | ORAL | 2 refills | Status: DC

## 2016-12-15 NOTE — Telephone Encounter (Signed)
[facility] left v/m requesting refills for finasteride. Refill per protocol. Last CPX 09/03/16.

## 2016-12-16 DIAGNOSIS — M25511 Pain in right shoulder: Secondary | ICD-10-CM | POA: Diagnosis not present

## 2016-12-21 DIAGNOSIS — M25511 Pain in right shoulder: Secondary | ICD-10-CM | POA: Diagnosis not present

## 2016-12-23 ENCOUNTER — Ambulatory Visit (INDEPENDENT_AMBULATORY_CARE_PROVIDER_SITE_OTHER): Payer: Medicare Other | Admitting: Specialist

## 2016-12-23 DIAGNOSIS — M25511 Pain in right shoulder: Secondary | ICD-10-CM | POA: Diagnosis not present

## 2016-12-28 DIAGNOSIS — M25511 Pain in right shoulder: Secondary | ICD-10-CM | POA: Diagnosis not present

## 2016-12-30 DIAGNOSIS — M25511 Pain in right shoulder: Secondary | ICD-10-CM | POA: Diagnosis not present

## 2016-12-31 ENCOUNTER — Ambulatory Visit (INDEPENDENT_AMBULATORY_CARE_PROVIDER_SITE_OTHER): Payer: Medicare Other | Admitting: Orthopedic Surgery

## 2016-12-31 ENCOUNTER — Encounter (INDEPENDENT_AMBULATORY_CARE_PROVIDER_SITE_OTHER): Payer: Self-pay | Admitting: Orthopedic Surgery

## 2016-12-31 DIAGNOSIS — M19011 Primary osteoarthritis, right shoulder: Secondary | ICD-10-CM | POA: Diagnosis not present
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
## 2017-01-04 DIAGNOSIS — M25511 Pain in right shoulder: Secondary | ICD-10-CM | POA: Diagnosis not present

## 2017-01-06 DIAGNOSIS — M25511 Pain in right shoulder: Secondary | ICD-10-CM | POA: Diagnosis not present

## 2017-01-31 ENCOUNTER — Other Ambulatory Visit: Payer: Self-pay

## 2017-01-31 MED ORDER — OXYBUTYNIN CHLORIDE 5 MG PO TABS: 5.0000 mg | ORAL_TABLET | Freq: Every day | ORAL | 2 refills | Status: DC

## 2017-01-31 NOTE — Telephone Encounter (Signed)
Rx sent electronically.  

## 2017-02-01 MED ORDER — OXYBUTYNIN CHLORIDE 5 MG PO TABS: ORAL_TABLET | ORAL | 2 refills | Status: DC

## 2017-02-01 MED ORDER — OXYBUTYNIN CHLORIDE 5 MG PO TABS: 5.0000 mg | ORAL_TABLET | Freq: Every day | ORAL | 2 refills | Status: DC

## 2017-02-01 NOTE — Addendum Note (Signed)
Addended by: Pilar Grammes on: 02/01/2017 12:34 PM   Modules accepted: Orders

## 2017-02-04 ENCOUNTER — Telehealth: Payer: Self-pay

## 2017-02-04 NOTE — Telephone Encounter (Signed)
[name] with [facility] said received 2 rx for oxybutynin 5 mg on 02/01/17; one with instructions one at hs and another with take one tab bid. Which is correct? [name] request cb.

## 2017-02-05 NOTE — Telephone Encounter (Signed)
I think he is just taking it at bedtime but it wasn't changed on our list Confirm with him then let pharmacist know

## 2017-02-07 NOTE — Telephone Encounter (Signed)
Spoke to [place] at [facility]

## 2017-02-07 NOTE — Telephone Encounter (Signed)
Spoke to pt's wife. She thinks he takes 1 at bedtime. I will let them know.

## 2017-02-16 ENCOUNTER — Encounter (INDEPENDENT_AMBULATORY_CARE_PROVIDER_SITE_OTHER): Payer: Self-pay | Admitting: Specialist

## 2017-02-16 MED ORDER — BUPIVACAINE HCL 0.25 % IJ SOLN
4.0000 mL | INTRAMUSCULAR | Status: AC | PRN
  Administered 2016-11-12: 4 mL via INTRA_ARTICULAR

## 2017-02-16 MED ORDER — LIDOCAINE HCL 1 % IJ SOLN
3.0000 mL | INTRAMUSCULAR | Status: AC | PRN
  Administered 2016-11-12: 3 mL

## 2017-02-16 MED ORDER — METHYLPREDNISOLONE ACETATE 40 MG/ML IJ SUSP
40.0000 mg | INTRAMUSCULAR | Status: AC | PRN
  Administered 2016-11-12: 40 mg via INTRA_ARTICULAR

## 2017-04-13 ENCOUNTER — Encounter: Payer: Self-pay | Admitting: Internal Medicine

## 2017-04-13 ENCOUNTER — Ambulatory Visit (INDEPENDENT_AMBULATORY_CARE_PROVIDER_SITE_OTHER): Payer: Medicare Other | Admitting: Internal Medicine

## 2017-04-13 VITALS — BP 122/88 | HR 68 | Temp 98.1°F | Wt 194.0 lb

## 2017-04-13 DIAGNOSIS — L03115 Cellulitis of right lower limb: Secondary | ICD-10-CM | POA: Diagnosis not present

## 2017-04-13 MED ORDER — CEPHALEXIN 500 MG PO CAPS
500.0000 mg | ORAL_CAPSULE | Freq: Three times a day (TID) | ORAL | 0 refills | Status: DC
Start: 2017-04-13 — End: 2017-04-27

## 2017-04-13 NOTE — Assessment & Plan Note (Signed)
After injury slipping on tractor Td is up to date Mild infection Instructed in cleaning with gentle pressure to remove slough cephalexin
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
## 2017-04-27 ENCOUNTER — Ambulatory Visit (INDEPENDENT_AMBULATORY_CARE_PROVIDER_SITE_OTHER): Payer: Medicare Other | Admitting: Internal Medicine

## 2017-04-27 ENCOUNTER — Encounter: Payer: Self-pay | Admitting: Internal Medicine

## 2017-04-27 VITALS — BP 136/74 | HR 58 | Temp 98.1°F | Wt 193.0 lb

## 2017-04-27 DIAGNOSIS — L03115 Cellulitis of right lower limb: Secondary | ICD-10-CM

## 2017-04-27 DIAGNOSIS — L97922 Non-pressure chronic ulcer of unspecified part of left lower leg with fat layer exposed: Secondary | ICD-10-CM | POA: Diagnosis not present

## 2017-04-27 DIAGNOSIS — L97909 Non-pressure chronic ulcer of unspecified part of unspecified lower leg with unspecified severity: Secondary | ICD-10-CM | POA: Insufficient documentation

## 2017-04-27 MED ORDER — CLINDAMYCIN HCL 300 MG PO CAPS: 300.0000 mg | ORAL_CAPSULE | Freq: Three times a day (TID) | ORAL | 0 refills | Status: DC
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
## 2017-04-27 NOTE — Assessment & Plan Note (Signed)
Will try wet to dry dressings If not improving, will refer to wound clinic

## 2017-04-27 NOTE — Assessment & Plan Note (Signed)
Non healing ulcer Mild degree of infection Will change to clinda

## 2017-05-04 ENCOUNTER — Encounter: Payer: Self-pay | Admitting: Internal Medicine

## 2017-05-04 ENCOUNTER — Ambulatory Visit (INDEPENDENT_AMBULATORY_CARE_PROVIDER_SITE_OTHER): Payer: Medicare Other | Admitting: Internal Medicine

## 2017-05-04 VITALS — BP 118/80 | HR 90 | Temp 98.0°F | Wt 192.0 lb

## 2017-05-04 DIAGNOSIS — L97922 Non-pressure chronic ulcer of unspecified part of left lower leg with fat layer exposed: Secondary | ICD-10-CM | POA: Diagnosis not present

## 2017-05-04 DIAGNOSIS — Z23 Encounter for immunization: Secondary | ICD-10-CM | POA: Diagnosis not present

## 2017-05-04 MED ORDER — CEFUROXIME AXETIL 250 MG PO TABS: 250.0000 mg | ORAL_TABLET | Freq: Two times a day (BID) | ORAL | 0 refills | Status: DC
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
## 2017-05-04 NOTE — Addendum Note (Signed)
Addended by: Verlene Mayer A on: 05/04/2017 11:48 AM   Modules accepted: Orders

## 2017-05-04 NOTE — Assessment & Plan Note (Signed)
Non healing and still seems inflamed (and possibly infected) Will broaden the coverage--- cefuroxime Refer to wound clinic

## 2017-05-11 ENCOUNTER — Ambulatory Visit (INDEPENDENT_AMBULATORY_CARE_PROVIDER_SITE_OTHER): Payer: Medicare Other | Admitting: Internal Medicine

## 2017-05-11 ENCOUNTER — Encounter: Payer: Self-pay | Admitting: Internal Medicine

## 2017-05-11 VITALS — BP 132/84 | HR 63 | Temp 97.5°F | Wt 191.0 lb

## 2017-05-11 DIAGNOSIS — L97922 Non-pressure chronic ulcer of unspecified part of left lower leg with fat layer exposed: Secondary | ICD-10-CM | POA: Diagnosis not present

## 2017-05-11 MED ORDER — DOXYCYCLINE HYCLATE 100 MG PO TABS: 100.0000 mg | ORAL_TABLET | Freq: Two times a day (BID) | ORAL | 0 refills | Status: DC
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
## 2017-05-11 NOTE — Assessment & Plan Note (Signed)
Inflamed but no progression to suggest untreated infection. No help with ceftin. Just had clinda without effect, but will try doxy just in case Going to wound clinic next week

## 2017-05-17 ENCOUNTER — Encounter: Payer: Medicare Other | Attending: Internal Medicine | Admitting: Internal Medicine

## 2017-05-17 DIAGNOSIS — Z86718 Personal history of other venous thrombosis and embolism: Secondary | ICD-10-CM | POA: Diagnosis not present

## 2017-05-17 DIAGNOSIS — I10 Essential (primary) hypertension: Secondary | ICD-10-CM | POA: Insufficient documentation

## 2017-05-17 DIAGNOSIS — I87311 Chronic venous hypertension (idiopathic) with ulcer of right lower extremity: Secondary | ICD-10-CM | POA: Insufficient documentation

## 2017-05-17 DIAGNOSIS — L97811 Non-pressure chronic ulcer of other part of right lower leg limited to breakdown of skin: Secondary | ICD-10-CM | POA: Diagnosis not present

## 2017-05-17 DIAGNOSIS — M1990 Unspecified osteoarthritis, unspecified site: Secondary | ICD-10-CM | POA: Diagnosis not present

## 2017-05-17 DIAGNOSIS — D510 Vitamin B12 deficiency anemia due to intrinsic factor deficiency: Secondary | ICD-10-CM | POA: Diagnosis not present

## 2017-05-17 DIAGNOSIS — S81801A Unspecified open wound, right lower leg, initial encounter: Secondary | ICD-10-CM | POA: Diagnosis not present

## 2017-05-17 DIAGNOSIS — S81811D Laceration without foreign body, right lower leg, subsequent encounter: Secondary | ICD-10-CM | POA: Insufficient documentation

## 2017-05-17 DIAGNOSIS — X58XXXD Exposure to other specified factors, subsequent encounter: Secondary | ICD-10-CM | POA: Diagnosis not present

## 2017-05-18 NOTE — Progress Notes (Signed)
BO, TEICHER ([id number]) Visit Report for 05/17/2017 Abuse/Suicide Risk Screen Details Patient Name: [name], [name]. Date of Service: 05/17/2017 8:00 AM Medical Record Number: [id number] Patient Account Number: [id number] Date of Birth/Sex: 1935-02-13 (82 y.o. Male) Treating RN: Montey Hora Primary Care Ermelinda Eckert: [name] Other Clinician: Referring Valeree Leidy: [name] Treating Daksha Koone/Extender: Tito Dine in Treatment: 0 Abuse/Suicide Risk Screen Items Answer ABUSE/SUICIDE RISK SCREEN: Has anyone close to you tried to hurt or harm you recentlyo No Do you feel uncomfortable with anyone in your familyo No Has anyone forced you do things that you didnot want to doo No Do you have any thoughts of harming yourselfo No Patient displays signs or symptoms of abuse and/or neglect. No Electronic Signature(s) Signed: 05/17/2017 5:02:03 PM By: Montey Hora Entered By: Montey Hora on 05/17/2017 08:17:23 [place], Leafy Half ([id number]) -------------------------------------------------------------------------------- Activities of Daily Living Details Patient Name: [name]. Date of Service: 05/17/2017 8:00 AM Medical Record Number: [id number] Patient Account Number: [id number] Date of Birth/Sex: 1935-04-22 (82 y.o. Male) Treating RN: Montey Hora Primary Care Josephina Melcher: [name] Other Clinician: Referring Leanthony Rhett: [name] Treating Vedh Ptacek/Extender: Tito Dine in Treatment: 0 Activities of Daily Living Items Answer Activities of Daily Living (Please select one for each item) Drive Automobile Completely Able Take Medications Completely Able Use Telephone Completely Able Care for Appearance Completely Able Use Toilet Completely Able Bath / Shower Completely Able Dress Self Completely Able Feed Self Completely Able Walk Completely Able Get In / Out Bed Completely Able Housework Completely Able Prepare Meals  Completely Able Handle Money Completely Able Shop for Self Completely Able Electronic Signature(s) Signed: 05/17/2017 5:02:03 PM By: Montey Hora Entered By: Montey Hora on 05/17/2017 08:17:41 [place], Leafy Half ([id number]) -------------------------------------------------------------------------------- Education Assessment Details Patient Name: [name]. Date of Service: 05/17/2017 8:00 AM Medical Record Number: [id number] Patient Account Number: [id number] Date of Birth/Sex: [birth year] (82 y.o. Male) Treating RN: Montey Hora Primary Care Avelynn Sellin: [name] Other Clinician: Referring Lucendia Leard: [name] Treating Tyreshia Ingman/Extender: Tito Dine in Treatment: 0 Primary Learner Assessed: Patient Learning Preferences/Education Level/Primary Language Learning Preference: Explanation, Demonstration Highest Education Level: High School Preferred Language: English Cognitive Barrier Assessment/Beliefs Language Barrier: No Translator Needed: No Memory Deficit: No Emotional Barrier: No Cultural/Religious Beliefs Affecting Medical No Care: Physical Barrier Assessment Impaired Vision: No Impaired Hearing: No Decreased Hand dexterity: No Knowledge/Comprehension Assessment Knowledge Level: Medium Comprehension Level: Medium Ability to understand written Medium instructions: Ability to understand verbal Medium instructions: Motivation Assessment Anxiety Level: Calm Cooperation: Cooperative Education Importance: Acknowledges Need Interest in Health Problems: Asks Questions Perception: Coherent Willingness to Engage in Self- Medium Management Activities: Readiness to Engage in Self- Medium Management Activities: Electronic Signature(s) YUSUKE, BEZA ([id number]) Signed: 05/17/2017 5:02:03 PM By: Montey Hora Entered By: Montey Hora on 05/17/2017 08:18:02 [place], Leafy Half  ([id number]) -------------------------------------------------------------------------------- Fall Risk Assessment Details Patient Name: [name]. Date of Service: 05/17/2017 8:00 AM Medical Record Number: [id number] Patient Account Number: [id number] Date of Birth/Sex: 1935/02/27 (82 y.o. Male) Treating RN: Montey Hora Primary Care [name]: [name] Other Clinician: Referring Albert Devaul: [name] Treating Nicolle Heward/Extender: Tito Dine in Treatment: 0 Fall Risk Assessment Items Have you had 2 or more falls in the last 12 monthso 0 No Have you had any fall that resulted in injury in the last 12 monthso 0 No FALL RISK ASSESSMENT: History of falling - immediate or within 3 months 0 No Secondary diagnosis 0 No Ambulatory aid None/bed rest/wheelchair/nurse 0 Yes Crutches/cane/walker  0 No Furniture 0 No IV Access/Saline Lock 0 No Gait/Training Normal/bed rest/immobile 0 Yes Weak 0 No Impaired 0 No Mental Status Oriented to own ability 0 Yes Electronic Signature(s) Signed: 05/17/2017 5:02:03 PM By: Montey Hora Entered By: Montey Hora on 05/17/2017 08:18:21 W[place], Leafy Half ([id number]) -------------------------------------------------------------------------------- Foot Assessment Details Patient Name: [name]. Date of Service: 05/17/2017 8:00 AM Medical Record Number: [id number] Patient Account Number: [id number] Date of Birth/Sex: 08-23-1934 (82 y.o. Male) Treating RN: Montey Hora Primary Care [name]: [name] Other Clinician: Referring Brentin Shin: [name] Treating Rafia Shedden/Extender: Tito Dine in Treatment: 0 Foot Assessment Items Site Locations + = Sensation present, - = Sensation absent, C = Callus, U = Ulcer R = Redness, W = Warmth, M = Maceration, PU = Pre-ulcerative lesion F = Fissure, S = Swelling, D = Dryness Assessment Right: Left: Other Deformity: No No Prior Foot Ulcer: No No Prior  Amputation: No No Charcot Joint: No No Ambulatory Status: Ambulatory Without Help Gait: Steady Electronic Signature(s) Signed: 05/17/2017 5:02:03 PM By: Montey Hora Entered By: Montey Hora on 05/17/2017 08:34:18 [place], Leafy Half ([id number]) -------------------------------------------------------------------------------- Nutrition Risk Assessment Details Patient Name: [name]. Date of Service: 05/17/2017 8:00 AM Medical Record Number: [id number] Patient Account Number: [id number] Date of Birth/Sex: [birth year] (82 y.o. Male) Treating RN: Montey Hora Primary Care Dimetrius Montfort: [name] Other Clinician: Referring Moody Robben: [name] Treating Nakia Remmers/Extender: Tito Dine in Treatment: 0 Height (in): Weight (lbs): Body Mass Index (BMI): Nutrition Risk Assessment Items NUTRITION RISK SCREEN: I have an illness or condition that made me change the kind and/or 0 No amount of food I eat I eat fewer than two meals per day 0 No I eat few fruits and vegetables, or milk products 0 No I have three or more drinks of beer, liquor or wine almost every day 0 No I have tooth or mouth problems that make it hard for me to eat 0 No I don't always have enough money to buy the food I need 0 No I eat alone most of the time 0 No I take three or more different prescribed or over-the-counter drugs a 1 Yes day Without wanting to, I have lost or gained 10 pounds in the last six 0 No months I am not always physically able to shop, cook and/or feed myself 0 No Nutrition Protocols Good Risk Protocol 0 No interventions needed Moderate Risk Protocol Electronic Signature(s) Signed: 05/17/2017 5:02:03 PM By: Montey Hora Entered By: Montey Hora on 05/17/2017 54:49:20

## 2017-05-18 NOTE — Progress Notes (Signed)
KHAN, CHURA ([id number]) Visit Report for 05/17/2017 Chief Complaint Document Details Patient Name: [name], [name]. Date of Service: 05/17/2017 8:00 AM Medical Record Number: [id number] Patient Account Number: [id number] Date of Birth/Sex: Sep 25, 1934 (82 y.o. Male) Treating RN: Montey Hora Primary Care Provider: [name] Other Clinician: Referring Provider: [name] Treating Provider/Extender: Tito Dine in Treatment: 0 Information Obtained from: Patient Chief Complaint 05/17/17; patient is here for review of a traumatic wound on the right anterior lower leg Electronic Signature(s) Signed: 05/17/2017 4:50:14 PM By: Linton Ham MD Entered By: Linton Ham on 05/17/2017 09:05:40 Beezley, [name] ([id number]) -------------------------------------------------------------------------------- Debridement Details Patient Name: [name]. Date of Service: 05/17/2017 8:00 AM Medical Record Number: [id number] Patient Account Number: [id number] Date of Birth/Sex: 1935-07-08 (82 y.o. Male) Treating RN: Montey Hora Primary Care Provider: [name] Other Clinician: Referring Provider: [name] Treating Provider/Extender: Tito Dine in Treatment: 0 Debridement Performed for Wound #1 Right,Anterior Lower Leg Assessment: Performed By: Physician Ricard Dillon, MD Debridement: Debridement Pre-procedure Verification/Time Out Yes - 08:49 Taken: Start Time: 08:49 Pain Control: Lidocaine 4% Topical Solution Level: Skin/Subcutaneous Tissue Total Area Debrided (L x 2.2 (cm) x 0.5 (cm) = 1.1 (cm) W): Tissue and other Viable, Non-Viable, Fibrin/Slough, Subcutaneous material debrided: Instrument: Curette Bleeding: Minimum Hemostasis Achieved: Pressure End Time: 08:53 Procedural Pain: 0 Post Procedural Pain: 0 Response to Treatment: Procedure was tolerated well Post Debridement Measurements of Total Wound Length:  (cm) 2.2 Width: (cm) 0.5 Depth: (cm) 0.2 Volume: (cm) 0.173 Character of Wound/Ulcer Post Improved Debridement: Post Procedure Diagnosis Same as Pre-procedure Electronic Signature(s) Signed: 05/17/2017 4:50:14 PM By: Linton Ham MD Signed: 05/17/2017 5:02:03 PM By: Montey Hora Entered By: Montey Hora on 05/17/2017 08:53:24 [name], [name] ([id number]) -------------------------------------------------------------------------------- HPI Details Patient Name: [name]. Date of Service: 05/17/2017 8:00 AM Medical Record Number: [id number] Patient Account Number: [id number] Date of Birth/Sex: 1935-01-16 (82 y.o. Male) Treating RN: Montey Hora Primary Care Provider: [name] Other Clinician: Referring Provider: [name] Treating Provider/Extender: Tito Dine in Treatment: 0 History of Present Illness HPI Description: 05/17/17; fairly healthy 82 year old who struck his right lower leg while getting off a tractor at the beginning of September. When he presented initially after 10 days to his primary physician on 04/13/17 he was felt to have concomitant cellulitis. He also had wounds on his arm and his upper right leg that healed however he has been left with a nonhealing wound on the right mid lower leg just along the tibia. He has had several courses of antibiotics for concomitant cellulitis including Keflex, cefuroxime and most recently doxycycline which she is just finishing. Currently using topical antibiotics. The patient is not a diabetic. He does have a history of vein stripping in 2012 on the right side. He is on prednisone 10 mg every other day for unclear reasons as far as the patient knows this is for "pain". ABIs in our clinic were 1.46 on the right and 1.47 on the left noncompressible Electronic Signature(s) Signed: 05/17/2017 4:50:14 PM By: Linton Ham MD Entered By: Linton Ham on 05/17/2017 09:10:23 [name], [name]  ([id number]) -------------------------------------------------------------------------------- Physical Exam Details Patient Name: [name]. Date of Service: 05/17/2017 8:00 AM Medical Record Number: [id number] Patient Account Number: [id number] Date of Birth/Sex: 09/12/1934 (82 y.o. Male) Treating RN: Montey Hora Primary Care Provider: [name] Other Clinician: Referring Provider: [name] Treating Provider/Extender: Tito Dine in Treatment: 0 Constitutional Patient is hypertensive.. Pulse regular and within  target range for patient.. Temperature is normal and within the target range for the patient.Marland Kitchen appears in no distress. Eyes Conjunctivae clear. No discharge. Ears, Nose, Mouth, and Throat Patient is hard of hearing.Marland Kitchen Respiratory Respiratory effort is easy and symmetric bilaterally. Rate is normal at rest and on room air.. Bilateral breath sounds are clear and equal in all lobes with no wheezes, rales or rhonchi.. Cardiovascular Heart rhythm and rate regular, without murmur or gallop.. Femoral arteries without bruits and pulses strong.. Pedal pulses palpable and strong bilaterally.. No edema. Mild skin changes in the right leg probably secondary to venous insufficiency.. Lymphatic Nonpalpable no popliteal or inguinal area. Integumentary (Hair, Skin) Some irritation around the wound on the right that looks like contact dermatitis certainly not cellulitis it is not at all tender. Psychiatric No evidence of depression, anxiety, or agitation. Calm, cooperative, and communicative. Appropriate interactions and affect.. Notes Wound exam; linear vertical wound on the right anterior leg. Some adherent necrotic debris removed with a #3 curet to reveal really healthy granulation tissue. There is some surrounding erythema here but no tenderness I suspect this is contact dermatitis from either something he is putting in the wound or perhaps the Band-Aid.  I as there is no tenderness or warmth I really don't think this is cellulitis Electronic Signature(s) Signed: 05/17/2017 4:50:14 PM By: Linton Ham MD Entered By: Linton Ham on 05/17/2017 09:12:28 [name] ([id number]) -------------------------------------------------------------------------------- Physician Orders Details Patient Name: [name]. Date of Service: 05/17/2017 8:00 AM Medical Record Number: [id number] Patient Account Number: [id number] Date of Birth/Sex: February 27, 1935 (82 y.o. Male) Treating RN: Montey Hora Primary Care Provider: [name] Other Clinician: Referring Provider: [name] Treating Provider/Extender: Tito Dine in Treatment: 0 Verbal / Phone Orders: No Diagnosis Coding ICD-10 Coding Code Description [phone] Laceration without foreign body, right lower leg, subsequent encounter Wound Cleansing Wound #1 Right,Anterior Lower Leg o Clean wound with Normal Saline. o May Shower, gently pat wound dry prior to applying new dressing. Anesthetic Wound #1 Right,Anterior Lower Leg o Topical Lidocaine 4% cream applied to wound bed prior to debridement Primary Wound Dressing Wound #1 Right,Anterior Lower Leg o Prisma Ag Secondary Dressing Wound #1 Right,Anterior Lower Leg o Dry Gauze o Boardered Foam Dressing Dressing Change Frequency Wound #1 Right,Anterior Lower Leg o Change dressing every other day. Follow-up Appointments Wound #1 Right,Anterior Lower Leg o Return Appointment in 1 week. Edema Control Wound #1 Right,Anterior Lower Leg o Elevate legs to the level of the heart and pump ankles as often as possible Additional Orders / Instructions Sider, William V. ([id number]) Wound #1 Right,Anterior Lower Leg o Increase protein intake. o Other: - Please add vitamin A, vitamin C and zinc supplements to your diet Electronic Signature(s) Signed: 05/17/2017 4:50:14 PM By: Linton Ham  MD Signed: 05/17/2017 5:02:03 PM By: Montey Hora Entered By: Montey Hora on 05/17/2017 08:55:01 [name], [name] ([id number]) -------------------------------------------------------------------------------- Problem List Details Patient Name: [name]. Date of Service: 05/17/2017 8:00 AM Medical Record Number: [id number] Patient Account Number: [id number] Date of Birth/Sex: 09-19-1934 (82 y.o. 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 Date of Service: 05/17/2017 8:00 AM Medical Record Number: [id number] Patient Account Number: [id number] Date of Birth/Sex: 04/10/1935 (82 y.o. Male) Treating RN: Montey Hora Primary Care Provider: [name] Other Clinician: Referring Provider: [name] Treating Provider/Extender: Tito Dine in Treatment: 0 Subjective Chief Complaint Information obtained from Patient 05/17/17; patient is here for review of a traumatic wound on the right anterior lower leg History of Present Illness (HPI) 05/17/17; fairly healthy 82 year old who struck his right lower leg while  getting off a tractor at the beginning of September. When he presented initially after 10 days to his primary physician on 04/13/17 he was felt to have concomitant cellulitis. He also had wounds on his arm and his upper right leg that healed however he has been left with a nonhealing wound on the right mid lower leg just along the tibia. He has had several courses of antibiotics for concomitant cellulitis including Keflex, cefuroxime and most recently doxycycline which she is just finishing. Currently using topical antibiotics. The patient is not a diabetic. He does have a history of vein stripping in 2012 on the right side. He is on prednisone 10 mg every other day for unclear reasons as far as the patient knows this is for "pain". ABIs in our clinic were 1.46 on the right and 1.47 on the left noncompressible Wound History Patient presents with 1 open wound that has been present for approximately 7 weeks. Patient has been treating wound in the following manner: neosporin. Laboratory tests have not been performed in the last month. Patient reportedly has not tested positive for an antibiotic resistant organism. Patient reportedly has not tested positive for osteomyelitis. Patient reportedly has not had testing performed to evaluate circulation in the legs. Patient experiences the following problems associated with their wounds: infection. Patient History Information obtained from Patient. Allergies No Known Allergies Family History Cancer - Father, No family history of Diabetes, Heart Disease, Hereditary Spherocytosis, Hypertension, Kidney Disease, Lung Disease, Seizures, Stroke, Thyroid Problems, Tuberculosis. Social History ORLEN, LEEDY ([id number]) Never smoker, Marital Status - Married, Alcohol Use - Never, Drug Use - No History, Caffeine Use - Daily. Medical History Respiratory Denies history of Aspiration, Asthma, Chronic Obstructive Pulmonary Disease (COPD), Pneumothorax, Sleep  Apnea, Tuberculosis Cardiovascular Patient has history of Deep Vein Thrombosis, Hypertension Denies history of Angina, Arrhythmia, Congestive Heart Failure, Coronary Artery Disease, Hypotension, Myocardial Infarction, Peripheral Arterial Disease, Peripheral Venous Disease, Phlebitis, Vasculitis Musculoskeletal Patient has history of Osteoarthritis Medical And Surgical History Notes Ear/Nose/Mouth/Throat HOH Hematologic/Lymphatic pernicious anemia Review of Systems (ROS) Constitutional Symptoms (General Health) The patient has no complaints or symptoms. Eyes The patient has no complaints or symptoms. Ear/Nose/Mouth/Throat The patient has no complaints or symptoms. Hematologic/Lymphatic The patient has no complaints or symptoms. Respiratory The patient has no complaints or symptoms. Cardiovascular The patient has no complaints or symptoms. Gastrointestinal The patient has no complaints or symptoms. Endocrine The patient has no complaints or symptoms. Genitourinary The patient has no complaints or symptoms. Immunological The patient has no complaints or symptoms. Integumentary (Skin) The patient has no complaints or symptoms. Neurologic The patient has no complaints or symptoms. Oncologic The patient has no complaints or symptoms. Psychiatric The patient has no complaints or symptoms. Brownrigg, William V. ([id number]) Objective Constitutional Patient is hypertensive.. Pulse regular  and within target range for patient.. Temperature is normal and within the target range for the patient.Marland Kitchen appears in no distress. Vitals Time Taken: 8:18 AM, Height: 72 in, Source: Measured, Weight: 187 lbs, Source: Measured, BMI: 25.4, Temperature: 98.2 F, Pulse: 75 bpm, Respiratory Rate: 18 breaths/min, Blood Pressure: 148/76 mmHg. Eyes Conjunctivae clear. No discharge. Ears, Nose, Mouth, and Throat Patient is hard of hearing.Marland Kitchen Respiratory Respiratory effort is easy and symmetric  bilaterally. Rate is normal at rest and on room air.. Bilateral breath sounds are clear and equal in all lobes with no wheezes, rales or rhonchi.. Cardiovascular Heart rhythm and rate regular, without murmur or gallop.. Femoral arteries without bruits and pulses strong.. Pedal pulses palpable and strong bilaterally.. No edema. Mild skin changes in the right leg probably secondary to venous insufficiency.. Lymphatic Nonpalpable no popliteal or inguinal area. Psychiatric No evidence of depression, anxiety, or agitation. Calm, cooperative, and communicative. Appropriate interactions and affect.. General Notes: Wound exam; linear vertical wound on the right anterior leg. Some adherent necrotic debris removed with a #3 curet to reveal really healthy granulation tissue. There is some surrounding erythema here but no tenderness I suspect this is contact dermatitis from either something he is putting in the wound or perhaps the Band-Aid. I as there is no tenderness or warmth I really don't think this is cellulitis Integumentary (Hair, Skin) Some irritation around the wound on the right that looks like contact dermatitis certainly not cellulitis it is not at all tender. Wound #1 status is Open. Original cause of wound was Trauma. The wound is located on the [place] ([id number]) Right,Anterior Lower Leg. The wound measures 2.2cm length x 0.5cm width x 0.1cm depth; 0.864cm^2 area and 0.086cm^3 volume. There is Fat Layer (Subcutaneous Tissue) Exposed exposed. There is no tunneling or undermining noted. There is a large amount of serous drainage noted. The wound margin is flat and intact. There is medium (34-66%) red granulation within the wound bed. There is a medium (34-66%) amount of necrotic tissue within the wound bed including Adherent Slough. The periwound skin appearance exhibited: Rash, Erythema. The periwound skin appearance did not exhibit: Callus, Crepitus, Excoriation, Induration,  Scarring, Dry/Scaly, Maceration, Atrophie Blanche, Cyanosis, Ecchymosis, Hemosiderin Staining, Mottled, Pallor, Rubor. The surrounding wound skin color is noted with erythema which is circumferential. Periwound temperature was noted as No Abnormality. Assessment Active Problems ICD-10 S81.811D - Laceration without foreign body, right lower leg, subsequent encounter L97.811 - Non-pressure chronic ulcer of other part of right lower leg limited to breakdown of skin I87.311 - Chronic venous hypertension (idiopathic) with ulcer of right lower extremity Procedures Wound #1 Pre-procedure diagnosis of Wound #1 is a Trauma, Other located on the Right,Anterior Lower Leg . There was a Skin/Subcutaneous Tissue Debridement ([id number]) debridement with total area of 1.1 sq cm performed by Ricard Dillon, MD. with the following instrument(s): Curette to remove Viable and Non-Viable tissue/material including Fibrin/Slough and Subcutaneous after achieving pain control using Lidocaine 4% Topical Solution. A time out was conducted at 08:49, prior to the start of the procedure. A Minimum amount of bleeding was controlled with Pressure. The procedure was tolerated well with a pain level of 0 throughout and a pain level of 0 following the procedure. Post Debridement Measurements: 2.2cm length x 0.5cm width x 0.2cm depth; 0.173cm^3 volume. Character of Wound/Ulcer Post Debridement is improved. Post procedure Diagnosis Wound #1: Same as Pre-Procedure Plan Wound Cleansing: Schultes, William V. ([id number]) Wound #1 Right,Anterior Lower Leg: Clean wound with Normal Saline.  May Shower, gently pat wound dry prior to applying new dressing. Anesthetic: Wound #1 Right,Anterior Lower Leg: Topical Lidocaine 4% cream applied to wound bed prior to debridement Primary Wound Dressing: Wound #1 Right,Anterior Lower Leg: Prisma Ag Secondary Dressing: Wound #1 Right,Anterior Lower Leg: Dry Gauze Boardered Foam  Dressing Dressing Change Frequency: Wound #1 Right,Anterior Lower Leg: Change dressing every other day. Follow-up Appointments: Wound #1 Right,Anterior Lower Leg: Return Appointment in 1 week. Edema Control: Wound #1 Right,Anterior Lower Leg: Elevate legs to the level of the heart and pump ankles as often as possible Additional Orders / Instructions: Wound #1 Right,Anterior Lower Leg: Increase protein intake. Other: - Please add vitamin A, vitamin C and zinc supplements to your diet #1 TCA, silver collagen, border foam that the patient will change every second day #2 he is finishing doxycycline and I don't think there is any current need to add more antibiotics. Any degree of cellulitis this man had his been successfully treated #3 surrounding contact dermatitis, we'll check the erythema here next week #4 patient has noncompressible vessels bilaterally on ABIs however has no clinical evidence of significant PAD and I don't think this will affect his wound currently. 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 Male) Treating RN: Montey Hora Primary Care Provider: [name] Other Clinician: Referring Provider: [name] Treating Provider/Extender: Tito Dine in Treatment: 0 Information Obtained From Patient Wound History Do you currently have one or more open woundso Yes How many open wounds do you currently haveo 1 Approximately how long have you had your woundso 7 weeks How have you been treating your wound(s) until nowo  neosporin Has your wound(s) ever healed and then re-openedo No Have you had any lab work done in the past montho No Have you tested positive for an antibiotic resistant organism (MRSA, VRE)o No Have you tested positive for osteomyelitis (bone infection)o No Have you had any tests for circulation on your legso No Have you had other problems associated with your woundso Infection Constitutional Symptoms (General Health) Complaints and Symptoms: No Complaints or Symptoms Eyes Complaints and Symptoms: No Complaints or Symptoms Ear/Nose/Mouth/Throat Complaints and Symptoms: No Complaints or Symptoms Medical History: Past Medical History Notes: HOH Hematologic/Lymphatic Complaints and Symptoms: No Complaints or Symptoms Medical History: Past Medical History Notes: pernicious anemia [name], William V. ([id number]) Respiratory Complaints and Symptoms: No Complaints or Symptoms Medical History: Negative for: Aspiration; Asthma; Chronic Obstructive Pulmonary Disease (COPD); Pneumothorax; Sleep Apnea; Tuberculosis Cardiovascular Complaints and Symptoms: No Complaints or Symptoms Medical History: Positive for: Deep Vein Thrombosis; Hypertension Negative for: Angina; Arrhythmia; Congestive Heart Failure; Coronary Artery Disease; Hypotension; Myocardial Infarction; Peripheral Arterial Disease; Peripheral Venous Disease; Phlebitis; Vasculitis Gastrointestinal Complaints and Symptoms: No Complaints or Symptoms Endocrine Complaints and Symptoms: No Complaints or Symptoms Genitourinary Complaints and Symptoms: No Complaints or Symptoms Immunological Complaints and Symptoms: No Complaints or Symptoms Integumentary (Skin) Complaints and Symptoms: No Complaints or Symptoms Musculoskeletal Medical History: Positive for: Osteoarthritis Neurologic Fralix, William V. ([id number]) Complaints and Symptoms: No Complaints or Symptoms Oncologic Complaints and Symptoms: No Complaints or  Symptoms Psychiatric Complaints and Symptoms: No Complaints or Symptoms Immunizations Pneumococcal Vaccine: Received Pneumococcal Vaccination: Yes Implantable Devices Family and Social History Cancer: Yes -  Father; Diabetes: No; Heart Disease: No; Hereditary Spherocytosis: No; Hypertension: No; Kidney Disease: No; Lung Disease: No; Seizures: No; Stroke: No; Thyroid Problems: No; Tuberculosis: No; Never smoker; Marital Status - Married; Alcohol Use: Never; Drug Use: No History; Caffeine Use: Daily; Financial Concerns: No; Food, Clothing or Shelter Needs: No; Support System Lacking: No; Transportation Concerns: No; Advanced Directives: No; Patient does not want information on Advanced Directives Electronic Signature(s) Signed: 05/17/2017 4:50:14 PM By: Linton Ham MD Signed: 05/17/2017 5:02:03 PM By: Montey Hora Entered By: Montey Hora on 05/17/2017 08:26:37 [name], [name] ([id number]) -------------------------------------------------------------------------------- SuperBill Details Patient Name: [name]. Date of Service: 05/17/2017 Medical Record Number: [id number] Patient Account Number: [id number] Date of Birth/Sex: 19-Aug-1934 (82 y.o. Male) Treating RN: Montey Hora Primary Care Provider: [name] Other Clinician: Referring Provider: [name] Treating Provider/Extender: Tito Dine in Treatment: 0 Diagnosis Coding ICD-10 Codes Code Description [phone] Laceration without foreign body, right lower leg, subsequent encounter L97.811 Non-pressure chronic ulcer of other part of right lower leg limited to breakdown of skin I87.311 Chronic venous hypertension (idiopathic) with ulcer of right lower extremity Facility Procedures CPT4: Description Modifier Quantity Code [id number] 99213 - WOUND CARE VISIT-LEV 3 EST PT 1 CPT4: [id number] 11042 - DEB SUBQ TISSUE 20 SQ CM/< 1 ICD-10 Description Diagnosis L97.811 Non-pressure chronic ulcer of other  part of right lower leg limited to breakdown of skin Physician Procedures CPT4: Description Modifier Quantity Code [id number] WC PHYS LEVEL 3 o NEW PT 25 1 ICD-10 Description Diagnosis S81.811D Laceration without foreign body, right lower leg, subsequent encounter L97.811 Non-pressure chronic ulcer of other part of right lower  leg limited to breakdown of skin I87.311 Chronic venous hypertension (idiopathic) with ulcer of right lower extremity CPT4: [id number] 11042 - WC PHYS SUBQ TISS 20 SQ CM 1 ICD-10 Description Diagnosis L97.811 Non-pressure chronic ulcer of other part of right lower leg limited to breakdown of skin Gerardo, William V. ([id number]) Electronic Signature(s) Signed: 05/17/2017 10:34:54 AM By: Montey Hora Signed: 05/17/2017 4:50:14 PM By: Linton Ham MD Entered By: Montey Hora on 05/17/2017 10:34:53

## 2017-05-19 NOTE — Progress Notes (Signed)
KEATEN, MASHEK ([id number]) Visit Report for 05/17/2017 Allergy List Details Patient Name: [name], [name]. Date of Service: 05/17/2017 8:00 AM Medical Record Number: [id number] Patient Account Number: [id number] Date of Birth/Sex: 05/10/1935 (82 y.o. Male) Treating RN: Montey Hora Primary Care Colbin Jovel: [name] Other Clinician: Referring Jakerria Kingbird: [name] Treating Ripken Rekowski/Extender: Ricard Dillon Weeks in Treatment: 0 Allergies Active Allergies No Known Allergies Allergy Notes Electronic Signature(s) Signed: 05/17/2017 5:02:03 PM By: Montey Hora Entered By: Montey Hora on 05/17/2017 08:17:15 Slay, Leafy Half ([id number]) -------------------------------------------------------------------------------- Arrival Information Details Patient Name: [name] Date of Service: 05/17/2017 8:00 AM Medical Record Number: [id number] Patient Account Number: [id number] Date of Birth/Sex: 1935/03/21 (82 y.o. Male) Treating RN: Montey Hora Primary Care Ziana Heyliger: [name] Other Clinician: Referring Jaelah Hauth: [name] Treating Ky Moskowitz/Extender: Tito Dine in Treatment: 0 Visit Information Patient Arrived: Ambulatory Arrival Time: 08:13 Accompanied By: self Transfer Assistance: None Patient Identification Verified: Yes Secondary Verification Process Yes Completed: Electronic Signature(s) Signed: 05/17/2017 5:02:03 PM By: Montey Hora Entered By: Montey Hora on 05/17/2017 08:17:02 [place], Leafy Half ([id number]) -------------------------------------------------------------------------------- Clinic Level of Care Assessment Details Patient Name: [name]. Date of Service: 05/17/2017 8:00 AM Medical Record Number: [id number] Patient Account Number: [id number] Date of Birth/Sex: [birth year] (82 y.o. Male) Treating RN: Montey Hora Primary Care Hunt Zajicek: [name] Other Clinician: Referring Yasuo Phimmasone: [name] Treating Desiray Orchard/Extender: Tito Dine in Treatment: 0 Clinic Level of Care Assessment Items TOOL 1 Quantity Score []  - Use when EandM and Procedure is performed on INITIAL visit 0 ASSESSMENTS - Nursing Assessment / Reassessment X - General Physical Exam (combine w/ comprehensive assessment (listed just 1 20 below) when performed on new pt. evals) X - Comprehensive Assessment (HX, ROS, Risk Assessments, Wounds Hx, etc.) 1 25 ASSESSMENTS - Wound and Skin Assessment / Reassessment []  - Dermatologic / Skin Assessment (not related to wound area) 0 ASSESSMENTS - Ostomy and/or Continence Assessment and Care []  - Incontinence Assessment and Management 0 []  - Ostomy Care Assessment and Management (repouching, etc.) 0 PROCESS - Coordination of Care X - Simple Patient / Family Education for ongoing care 1 15 []  - Complex (extensive) Patient / Family Education for ongoing care 0 X - Staff obtains Programmer, systems, Records, Test Results / Process Orders 1 10 []  - Staff telephones HHA, Nursing Homes / Clarify orders / etc 0 []  - Routine Transfer to another Facility (non-emergent condition) 0 []  - Routine Hospital Admission (non-emergent condition) 0 X - New Admissions / Biomedical engineer / Ordering NPWT, Apligraf, etc. 1 15 []  - Emergency Hospital Admission (emergent condition) 0 PROCESS - Special Needs []  - Pediatric / Minor Patient Management 0 []  - Isolation Patient Management 0 Crisanto, Carrel V. ([id number]) []  - Hearing / Language / Visual special needs 0 []  - Assessment of Community assistance (transportation, D/C planning, etc.) 0 []  - Additional assistance / Altered mentation 0 []  - Support Surface(s) Assessment (bed, cushion, seat, etc.) 0 INTERVENTIONS - Miscellaneous []  - External ear exam 0 []  - Patient Transfer (multiple staff / Civil Service fast streamer / Similar devices) 0 []  - Simple Staple / Suture removal (25 or less) 0 []  - Complex Staple / Suture removal (26 or more)  0 []  - Hypo/Hyperglycemic Management (do not check if billed separately) 0 X - Ankle / Brachial Index (ABI) - do not check if billed separately 1 15 Has the patient been seen at the hospital within the last three years: Yes Total Score: 100 Level Of Care: New/Established -  Level 3 Electronic Signature(s) Signed: 05/17/2017 5:02:03 PM By: Montey Hora Entered By: Montey Hora on 05/17/2017 08:55:24 [place], Leafy Half ([id number]) -------------------------------------------------------------------------------- Encounter Discharge Information Details Patient Name: [name]. Date of Service: 05/17/2017 8:00 AM Medical Record Number: [id number] Patient Account Number: [id number] Date of Birth/Sex: 06/07/1935 (82 y.o. Male) Treating RN: Montey Hora Primary Care Felicity Penix: [name] Other Clinician: Referring Tashiya Souders: [name] Treating Mayline Dragon/Extender: Tito Dine in Treatment: 0 Encounter Discharge Information Items Discharge Pain Level: 0 Discharge Condition: Stable Ambulatory Status: Ambulatory Discharge Destination: Home Transportation: Private Auto Accompanied By: self Schedule Follow-up Appointment: Yes Medication Reconciliation completed and provided to Patient/Care No Manoah Deckard: Provided on Clinical Summary of Care: 05/17/2017 Form Type Recipient Paper Patient CM Electronic Signature(s) Signed: 05/18/2017 3:48:01 PM By: Ruthine Dose Previous Signature: 05/17/2017 8:46:17 AM Version By: Montey Hora Entered By: Ruthine Dose on 05/17/2017 09:10:51 Vazquez, Leafy Half ([id number]) -------------------------------------------------------------------------------- Lower Extremity Assessment Details Patient Name: [name]. Date of Service: 05/17/2017 8:00 AM Medical Record Number: [id number] Patient Account Number: [id number] Date of Birth/Sex: 22-Aug-1934 (82 y.o. Male) Treating RN: Montey Hora Primary Care Kyleena Scheirer: [name] Other Clinician: Referring Jonetta Dagley: [name] Treating Edwyn Inclan/Extender: Tito Dine in Treatment: 0 Edema Assessment Assessed: [Left: No] [Right: No] Edema: [Left: No] [Right: No] Vascular Assessment Pulses: Dorsalis Pedis Palpable: [Left:Yes] [Right:Yes] Doppler Audible: [Left:Yes] [Right:Yes] Posterior Tibial Palpable: [Left:Yes] [Right:Yes] Doppler Audible: [Left:Yes] [Right:Yes] Extremity colors, hair growth, and conditions: Extremity Color: [Left:Normal] [Right:Normal] Hair Growth on Extremity: [Left:Yes] [Right:Yes] Temperature of Extremity: [Left:Warm] [Right:Warm] Capillary Refill: [Left:< 3 seconds] [Right:< 3 seconds] Blood Pressure: Brachial: [Left:142] [Right:144] Dorsalis Pedis: 210 [Left:Dorsalis Pedis: 208] Ankle: Posterior Tibial: 206 [Left:Posterior Tibial: 212 1.46] [Right:1.47] Toe Nail Assessment Left: Right: Thick: Yes Yes Discolored: Yes Yes Deformed: No No Improper Length and Hygiene: No No Electronic Signature(s) Signed: 05/17/2017 5:02:03 PM By: Montey Hora Entered By: Montey Hora on 05/17/2017 08:42:46 Camire, Odean VMarland Kitchen ([id number]) -------------------------------------------------------------------------------- Multi Wound Chart Details Patient Name: [name]. Date of Service: 05/17/2017 8:00 AM Medical Record Number: [id number] Patient Account Number: [id number] Date of Birth/Sex: [birth year] (82 y.o. Male) Treating RN: Montey Hora Primary Care Latrece Nitta: [name] Other Clinician: Referring Corvin Sorbo: [name] Treating Bridgit Eynon/Extender: Tito Dine in Treatment: 0 Vital Signs Height(in): 72 Pulse(bpm): 75 Weight(lbs): 187 Blood Pressure 148/76 (mmHg): Body Mass Index(BMI): 25 Temperature(F): 98.2 Respiratory Rate 18 (breaths/min): Photos: [1:No Photos] [N/A:N/A] Wound Location: [1:Right Lower Leg - Anterior N/A] Wounding Event: [1:Trauma] [N/A:N/A] Primary  Etiology: [1:Trauma, Other] [N/A:N/A] Comorbid History: [1:Deep Vein Thrombosis, N/A Hypertension, Osteoarthritis] Date Acquired: [1:03/29/2017] [N/A:N/A] Weeks of Treatment: [1:0] [N/A:N/A] Wound Status: [1:Open] [N/A:N/A] Measurements L x W x D 2.2x0.5x0.1 [N/A:N/A] (cm) Area (cm) : [1:0.864] [N/A:N/A] Volume (cm) : [1:0.086] [N/A:N/A] Classification: [1:Full Thickness With Exposed Support Structures] [N/A:N/A] Exudate Amount: [1:Large] [N/A:N/A] Exudate Type: [1:Serous] [N/A:N/A] Exudate Color: [1:amber] [N/A:N/A] Wound Margin: [1:Flat and Intact] [N/A:N/A] Granulation Amount: [1:Medium (34-66%)] [N/A:N/A] Granulation Quality: [1:Red] [N/A:N/A] Necrotic Amount: [1:Medium (34-66%)] [N/A:N/A] Exposed Structures: [1:Fat Layer (Subcutaneous N/A Tissue) Exposed: Yes Fascia: No Tendon: No Muscle: No] Joint: No Bone: No Epithelialization: None N/A N/A Debridement: Debridement (16073- N/A N/A 11047) Pre-procedure 08:49 N/A N/A Verification/Time Out Taken: Pain Control: Lidocaine 4% Topical N/A N/A Solution Tissue Debrided: Fibrin/Slough, N/A N/A Subcutaneous Level: Skin/Subcutaneous N/A N/A Tissue Debridement Area (sq 1.1 N/A N/A cm): Instrument: Curette N/A N/A Bleeding: Minimum N/A N/A Hemostasis Achieved: Pressure N/A N/A Procedural Pain: 0 N/A N/A Post Procedural Pain: 0 N/A N/A Debridement Treatment  Procedure was tolerated N/A N/A Response: well Post Debridement 2.2x0.5x0.2 N/A N/A Measurements L x W x D (cm) Post Debridement 0.173 N/A N/A Volume: (cm) Periwound Skin Texture: Rash: Yes N/A N/A Excoriation: No Induration: No Callus: No Crepitus: No Scarring: No Periwound Skin Maceration: No N/A N/A Moisture: Dry/Scaly: No Periwound Skin Color: Erythema: Yes N/A N/A Atrophie Blanche: No Cyanosis: No Ecchymosis: No Hemosiderin Staining: No Mottled: No Pallor: No Rubor: No Erythema Location: Circumferential N/A N/A Temperature: No Abnormality N/A  N/A Tenderness on No N/A N/A Palpation: Wound Preparation: Ulcer Cleansing: N/A N/A Rinsed/Irrigated with Traber, Jhonathan V. ([id number]) Saline Topical Anesthetic Applied: Other: lidocaine 4% Procedures Performed: Debridement N/A N/A Treatment Notes Electronic Signature(s) Signed: 05/17/2017 4:50:14 PM By: Linton Ham MD Entered By: Linton Ham on 05/17/2017 09:05:21 Oyervides, Leafy Half ([id number]) -------------------------------------------------------------------------------- Multi-Disciplinary Care Plan Details Patient Name: [name]. Date of Service: 05/17/2017 8:00 AM Medical Record Number: [id number] Patient Account Number: [id number] Date of Birth/Sex: [birth year] (82 y.o. Male) Treating RN: Montey Hora Primary Care Mical Brun: [name] Other Clinician: Referring Colan Laymon: [name] Treating Lasaro Primm/Extender: Tito Dine in Treatment: 0 Active Inactive ` Abuse / Safety / Falls / Self Care Management Nursing Diagnoses: Potential for falls Goals: Patient will not experience any injury related to falls Date Initiated: 05/17/2017 Target Resolution Date: 08/05/2017 Goal Status: Active Interventions: Assess fall risk on admission and as needed Notes: ` Orientation to the Wound Care Program Nursing Diagnoses: Knowledge deficit related to the wound healing center program Goals: Patient/caregiver will verbalize understanding of the [place] Date Initiated: 05/17/2017 Target Resolution Date: 08/05/2017 Goal Status: Active Interventions: Provide education on orientation to the wound center Notes: ` Wound/Skin Impairment Nursing Diagnoses: Impaired tissue integrity Sustaita, Wynne V. ([id number]) Goals: Ulcer/skin breakdown will heal within 14 weeks Date Initiated: 05/17/2017 Target Resolution Date: 08/05/2017 Goal Status: Active Interventions: Assess patient/caregiver ability to obtain necessary supplies Assess  patient/caregiver ability to perform ulcer/skin care regimen upon admission and as needed Assess ulceration(s) every visit Notes: Electronic Signature(s) Signed: 05/17/2017 5:02:03 PM By: Montey Hora Entered By: Montey Hora on 05/17/2017 08:52:13 [place], Chidi VMarland Kitchen ([id number]) -------------------------------------------------------------------------------- Pain Assessment Details Patient Name: [name]. Date of Service: 05/17/2017 8:00 AM Medical Record Number: [id number] Patient Account Number: [id number] Date of Birth/Sex: 11-03-1934 (82 y.o. Male) Treating RN: Montey Hora Primary Care Joletta Manner: [name] Other Clinician: Referring Brenden Rudman: [name] Treating Iyesha Such/Extender: Tito Dine in Treatment: 0 Active Problems Location of Pain Severity and Description of Pain Patient Has Paino No Site Locations Pain Management and Medication Current Pain Management: Notes Topical or injectable lidocaine is offered to patient for acute pain when surgical debridement is performed. If needed, Patient is instructed to use over the counter pain medication for the following 24-48 hours after debridement. Wound care MDs do not prescribed pain medications. Patient has chronic pain or uncontrolled pain. Patient has been instructed to make an appointment with their Primary Care Physician for pain management. Electronic Signature(s) Signed: 05/17/2017 5:02:03 PM By: Montey Hora Entered By: Montey Hora on 05/17/2017 08:18:55 Swager, Leafy Half ([id number]) -------------------------------------------------------------------------------- Patient/Caregiver Education Details Patient Name: [name] Date of Service: 05/17/2017 8:00 AM Medical Record Number: [id number] Patient Account Number: [id number] Date of Birth/Gender: [birth year] (82 y.o. Male) Treating RN: Montey Hora Primary Care Physician: [name] Other Clinician: Referring  Physician: [name] Treating Physician/Extender: Tito Dine in Treatment: 0 Education Assessment Education Provided To: Patient Education Topics Provided Venous: Handouts: Other: leg elevation  Methods: Explain/Verbal Responses: State content correctly Electronic Signature(s) Signed: 05/17/2017 5:02:03 PM By: Montey Hora Entered By: Montey Hora on 05/17/2017 08:46:35 P[place]nter, Leafy Half ([id number]) -------------------------------------------------------------------------------- Wound Assessment Details Patient Name: [name]. Date of Service: 05/17/2017 8:00 AM Medical Record Number: [id number] Patient Account Number: [id number] Date of Birth/Sex: 09-Mar-1935 (82 y.o. Male) Treating RN: Montey Hora Primary Care Chellie Vanlue: [name] Other Clinician: Referring Marceline Napierala: [name] Treating Imane Burrough/Extender: Tito Dine in Treatment: 0 Wound Status Wound Number: 1 Primary Trauma, Other Etiology: Wound Location: Right Lower Leg - Anterior Wound Status: Open Wounding Event: Trauma Comorbid Deep Vein Thrombosis, Date Acquired: 03/29/2017 History: Hypertension, Osteoarthritis Weeks Of Treatment: 0 Clustered Wound: No Photos Photo Uploaded By: Montey Hora on 05/17/2017 14:15:39 Wound Measurements Length: (cm) 2.2 Width: (cm) 0.5 Depth: (cm) 0.1 Area: (cm) 0.864 Volume: (cm) 0.086 % Reduction in Area: % Reduction in Volume: Epithelialization: None Tunneling: No Undermining: No Wound Description Full Thickness With Exposed Classification: Support Structures Wound Margin: Flat and Intact Exudate Large Amount: Exudate Type: Serous Exudate Color: amber Foul Odor After Cleansing: No Slough/Fibrino Yes Wound Bed Granulation Amount: Medium (34-66%) Exposed Structure Granulation Quality: Red Fascia Exposed: No Necrotic Amount: Medium (34-66%) Fat Layer (Subcutaneous Tissue) Exposed: Yes Langham, Norrin V.  ([id number]) Necrotic Quality: Adherent Slough Tendon Exposed: No Muscle Exposed: No Joint Exposed: No Bone Exposed: No Periwound Skin Texture Texture Color No Abnormalities Noted: No No Abnormalities Noted: No Callus: No Atrophie Blanche: No Crepitus: No Cyanosis: No Excoriation: No Ecchymosis: No Induration: No Erythema: Yes Rash: Yes Erythema Location: Circumferential Scarring: No Hemosiderin Staining: No Mottled: No Moisture Pallor: No No Abnormalities Noted: No Rubor: No Dry / Scaly: No Maceration: No Temperature / Pain Temperature: No Abnormality Wound Preparation Ulcer Cleansing: Rinsed/Irrigated with Saline Topical Anesthetic Applied: Other: lidocaine 4%, Treatment Notes Wound #1 (Right, Anterior Lower Leg) 1. Cleansed with: Clean wound with Normal Saline 2. Anesthetic Topical Lidocaine 4% cream to wound bed prior to debridement 4. Dressing Applied: Prisma Ag 5. Secondary Dressing Applied Bordered Foam Dressing Dry Gauze Electronic Signature(s) Signed: 05/17/2017 5:02:03 PM By: Montey Hora Entered By: Montey Hora on 05/17/2017 08:33:55 [place], Leafy Half ([id number]) -------------------------------------------------------------------------------- Vitals Details Patient Name: [name]. Date of Service: 05/17/2017 8:00 AM Medical Record Number: [id number] Patient Account Number: [id number] Date of Birth/Sex: 08/21/1934 (82 y.o. Male) Treating RN: Montey Hora Primary Care Wretha Laris: [name] Other Clinician: Referring Lorenda Grecco: [name] Treating Servando Kyllonen/Extender: Tito Dine in Treatment: 0 Vital Signs Time Taken: 08:18 Temperature (F): 98.2 Height (in): 72 Pulse (bpm): 75 Source: Measured Respiratory Rate (breaths/min): 18 Weight (lbs): 187 Blood Pressure (mmHg): 148/76 Source: Measured Reference Range: 80 - 120 mg / dl Body Mass Index (BMI): 25.4 Electronic Signature(s) Signed: 05/17/2017 5:02:03 PM  By: Montey Hora Entered By: Montey Hora on 05/17/2017 37:34:28

## 2017-05-24 ENCOUNTER — Encounter: Payer: Medicare Other | Admitting: Internal Medicine

## 2017-05-24 DIAGNOSIS — I87311 Chronic venous hypertension (idiopathic) with ulcer of right lower extremity: Secondary | ICD-10-CM | POA: Diagnosis not present

## 2017-05-24 DIAGNOSIS — S81811D Laceration without foreign body, right lower leg, subsequent encounter: Secondary | ICD-10-CM | POA: Diagnosis not present

## 2017-05-24 DIAGNOSIS — M1990 Unspecified osteoarthritis, unspecified site: Secondary | ICD-10-CM | POA: Diagnosis not present

## 2017-05-24 DIAGNOSIS — S81801A Unspecified open wound, right lower leg, initial encounter: Secondary | ICD-10-CM | POA: Diagnosis not present

## 2017-05-24 DIAGNOSIS — L97811 Non-pressure chronic ulcer of other part of right lower leg limited to breakdown of skin: Secondary | ICD-10-CM | POA: Diagnosis not present

## 2017-05-24 DIAGNOSIS — I872 Venous insufficiency (chronic) (peripheral): Secondary | ICD-10-CM | POA: Diagnosis not present

## 2017-05-24 DIAGNOSIS — Z86718 Personal history of other venous thrombosis and embolism: Secondary | ICD-10-CM | POA: Diagnosis not present

## 2017-05-24 DIAGNOSIS — I10 Essential (primary) hypertension: Secondary | ICD-10-CM | POA: Diagnosis not present

## 2017-05-26 NOTE — Progress Notes (Signed)
[name], [name] ([id number]) Visit Report for 05/24/2017 Arrival Information Details Patient Name: [name], [name]. Date of Service: 05/24/2017 11:00 AM Medical Record Number: [id number] Patient Account Number: [id number] Date of Birth/Sex: Apr 27, 1935 (82 y.o. Male) Treating RN: Montey Hora Primary Care Lise Pincus: [name] Other Clinician: Referring Harshith Pursell: [name] Treating Markelle Asaro/Extender: Tito Dine in Treatment: 1 Visit Information History Since Last Visit Added or deleted any medications: No Patient Arrived: Ambulatory Any new allergies or adverse reactions: No Arrival Time: 11:13 Had a fall or experienced change in No Accompanied By: self activities of daily living that may affect Transfer Assistance: None risk of falls: Patient Identification Verified: Yes Signs or symptoms of abuse/neglect since last visito No Secondary Verification Process Completed: Yes Hospitalized since last visit: No Has Dressing in Place as Prescribed: Yes Pain Present Now: No Electronic Signature(s) Signed: 05/24/2017 5:26:58 PM By: Montey Hora Entered By: Montey Hora on 05/24/2017 11:16:13 [place], [name] ([id number]) -------------------------------------------------------------------------------- Clinic Level of Care Assessment Details Patient Name: [name]. Date of Service: 05/24/2017 11:00 AM Medical Record Number: [id number] Patient Account Number: [id number] Date of Birth/Sex: [birth year] (82 y.o. Male) Treating RN: Montey Hora Primary Care [place] Nick: [name] Other Clinician: Referring Ryanne Morand: [name] Treating Torey Regan/Extender: Tito Dine in Treatment: 1 Clinic Level of Care Assessment Items TOOL 4 Quantity Score []  - Use when only an EandM is performed on FOLLOW-UP visit 0 ASSESSMENTS - Nursing Assessment / Reassessment X - Reassessment of Co-morbidities (includes updates in patient status) 1 10 X- 1  5 Reassessment of Adherence to Treatment Plan ASSESSMENTS - Wound and Skin Assessment / Reassessment X - Simple Wound Assessment / Reassessment - one wound 1 5 []  - 0 Complex Wound Assessment / Reassessment - multiple wounds []  - 0 Dermatologic / Skin Assessment (not related to wound area) ASSESSMENTS - Focused Assessment []  - Circumferential Edema Measurements - multi extremities 0 []  - 0 Nutritional Assessment / Counseling / Intervention X- 1 5 Lower Extremity Assessment (monofilament, tuning fork, pulses) []  - 0 Peripheral Arterial Disease Assessment (using hand held doppler) ASSESSMENTS - Ostomy and/or Continence Assessment and Care []  - Incontinence Assessment and Management 0 []  - 0 Ostomy Care Assessment and Management (repouching, etc.) PROCESS - Coordination of Care X - Simple Patient / Family Education for ongoing care 1 15 []  - 0 Complex (extensive) Patient / Family Education for ongoing care []  - 0 Staff obtains Programmer, systems, Records, Test Results / Process Orders []  - 0 Staff telephones HHA, Nursing Homes / Clarify orders / etc []  - 0 Routine Transfer to another Facility (non-emergent condition) []  - 0 Routine Hospital Admission (non-emergent condition) []  - 0 New Admissions / Biomedical engineer / Ordering NPWT, Apligraf, etc. []  - 0 Emergency Hospital Admission (emergent condition) X- 1 10 Simple Discharge Coordination [name], [name]. ([id number]) []  - 0 Complex (extensive) Discharge Coordination PROCESS - Special Needs []  - Pediatric / Minor Patient Management 0 []  - 0 Isolation Patient Management []  - 0 Hearing / Language / Visual special needs []  - 0 Assessment of Community assistance (transportation, D/C planning, etc.) []  - 0 Additional assistance / Altered mentation []  - 0 Support Surface(s) Assessment (bed, cushion, seat, etc.) INTERVENTIONS - Wound Cleansing / Measurement X - Simple Wound Cleansing - one wound 1 5 []  - 0 Complex Wound  Cleansing - multiple wounds X- 1 5 Wound Imaging (photographs - any number of wounds) []  - 0 Wound Tracing (instead of photographs) X- 1 5 Simple Wound  Measurement - one wound []  - 0 Complex Wound Measurement - multiple wounds INTERVENTIONS - Wound Dressings X - Small Wound Dressing one or multiple wounds 1 10 []  - 0 Medium Wound Dressing one or multiple wounds []  - 0 Large Wound Dressing one or multiple wounds []  - 0 Application of Medications - topical []  - 0 Application of Medications - injection INTERVENTIONS - Miscellaneous []  - External ear exam 0 []  - 0 Specimen Collection (cultures, biopsies, blood, body fluids, etc.) []  - 0 Specimen(s) / Culture(s) sent or taken to Lab for analysis []  - 0 Patient Transfer (multiple staff / Civil Service fast streamer / Similar devices) []  - 0 Simple Staple / Suture removal (25 or less) []  - 0 Complex Staple / Suture removal (26 or more) []  - 0 Hypo / Hyperglycemic Management (close monitor of Blood Glucose) []  - 0 Ankle / Brachial Index (ABI) - do not check if billed separately X- 1 5 Vital Signs [name], [name]. ([id number]) Has the patient been seen at the hospital within the last three years: Yes Total Score: 80 Level Of Care: New/Established - Level 3 Electronic Signature(s) Signed: 05/24/2017 5:26:58 PM By: Montey Hora Entered By: Montey Hora on 05/24/2017 13:14:19 [name], [name] ([id number]) -------------------------------------------------------------------------------- Encounter Discharge Information Details Patient Name: [name]. Date of Service: 05/24/2017 11:00 AM Medical Record Number: [id number] Patient Account Number: [id number] Date of Birth/Sex: 09/12/1934 (82 y.o. Male) Treating RN: Montey Hora Primary Care Ayza Ripoll: [name] Other Clinician: Referring Novie Maggio: [name] Treating Octaviano Mukai/Extender: Tito Dine in Treatment: 1 Encounter Discharge Information Items Discharge Pain  Level: 0 Discharge Condition: Stable Ambulatory Status: Ambulatory Discharge Destination: Home Transportation: Private Auto Accompanied By: self Schedule Follow-up Appointment: Yes Medication Reconciliation completed and No provided to Patient/Care Elliemae Braman: Patient Clinical Summary of Care: Declined Electronic Signature(s) Signed: 05/24/2017 4:39:05 PM By: Ruthine Dose Entered By: Ruthine Dose on 05/24/2017 11:40:08 [place], [name] ([id number]) -------------------------------------------------------------------------------- Lower Extremity Assessment Details Patient Name: [name]. Date of Service: 05/24/2017 11:00 AM Medical Record Number: [id number] Patient Account Number: [id number] Date of Birth/Sex: Aug 25, 1934 (82 y.o. Male) Treating RN: Montey Hora Primary Care Ramey Ketcherside: [name] Other Clinician: Referring Jerick Khachatryan: [name] Treating Mattew Chriswell/Extender: Tito Dine in Treatment: 1 Vascular Assessment Pulses: Dorsalis Pedis Palpable: [Right:Yes] Posterior Tibial Extremity colors, hair growth, and conditions: Extremity Color: [Right:Normal] Hair Growth on Extremity: [Right:Yes] Temperature of Extremity: [Right:Warm] Capillary Refill: [Right:< 3 seconds] Electronic Signature(s) Signed: 05/24/2017 5:26:58 PM By: Montey Hora Entered By: Montey Hora on 05/24/2017 11:19:25 [name], [name] ([id number]) -------------------------------------------------------------------------------- Multi Wound Chart Details Patient Name: [name]. Date of Service: 05/24/2017 11:00 AM Medical Record Number: [id number] Patient Account Number: [id number] Date of Birth/Sex: 08-Dec-1934 (82 y.o. Male) Treating RN: Montey Hora Primary Care Jyll Tomaro: [name] Other Clinician: Referring Eiza Canniff: [name] Treating Durand Wittmeyer/Extender: Tito Dine in Treatment: 1 Vital Signs Height(in): 72 Pulse(bpm): 52 Weight(lbs):  187 Blood Pressure(mmHg): 149/69 Body Mass Index(BMI): 25 Temperature(F): 98.4 Respiratory Rate 18 (breaths/min): Photos: [1:No Photos] [N/A:N/A] Wound Location: [1:Right Lower Leg - Anterior] [N/A:N/A] Wounding Event: [1:Trauma] [N/A:N/A] Primary Etiology: [1:Trauma, Other] [N/A:N/A] Comorbid History: [1:Deep Vein Thrombosis, Hypertension, Osteoarthritis] [N/A:N/A] Date Acquired: [1:03/29/2017] [N/A:N/A] Weeks of Treatment: [1:1] [N/A:N/A] Wound Status: [1:Open] [N/A:N/A] Measurements L x W x D [1:1.4x0.4x0.1] [N/A:N/A] (cm) Area (cm) : [1:0.44] [N/A:N/A] Volume (cm) : [1:0.044] [N/A:N/A] % Reduction in Area: [1:49.10%] [N/A:N/A] % Reduction in Volume: [1:48.80%] [N/A:N/A] Classification: [1:Full Thickness With Exposed Support Structures] [N/A:N/A] Exudate Amount: [1:Large] [N/A:N/A] Exudate  Type: [1:Serous] [N/A:N/A] Exudate Color: [1:amber] [N/A:N/A] Wound Margin: [1:Flat and Intact] [N/A:N/A] Granulation Amount: [1:Large (67-100%)] [N/A:N/A] Granulation Quality: [1:Red] [N/A:N/A] Necrotic Amount: [1:Small (1-33%)] [N/A:N/A] Exposed Structures: [1:Fat Layer (Subcutaneous Tissue) Exposed: Yes Fascia: No Tendon: No Muscle: No Joint: No Bone: No] [N/A:N/A] Epithelialization: [1:None] [N/A:N/A] Periwound Skin Texture: [1:Rash: Yes Excoriation: No Induration: No Callus: No] [N/A:N/A] Crepitus: No Scarring: No Periwound Skin Moisture: Maceration: No N/A N/A Dry/Scaly: No Periwound Skin Color: Erythema: Yes N/A N/A Atrophie Blanche: No Cyanosis: No Ecchymosis: No Hemosiderin Staining: No Mottled: No Pallor: No Rubor: No Erythema Location: Circumferential N/A N/A Temperature: No Abnormality N/A N/A Tenderness on Palpation: No N/A N/A Wound Preparation: Ulcer Cleansing: N/A N/A Rinsed/Irrigated with Saline Topical Anesthetic Applied: Other: lidocaine 4% Treatment Notes Electronic Signature(s) Signed: 05/24/2017 4:56:05 PM By: Linton Ham MD Entered By:  Linton Ham on 05/24/2017 12:02:56 [name], [name] ([id number]) -------------------------------------------------------------------------------- Multi-Disciplinary Care Plan Details Patient Name: [name] Date of Service: 05/24/2017 11:00 AM Medical Record Number: [id number] Patient Account Number: [id number] Date of Birth/Sex: March 02, 1935 (82 y.o. Male) Treating RN: Montey Hora Primary Care Valetta Mulroy: [name] Other Clinician: Referring Deisy Ozbun: [name] Treating Dashawna Delbridge/Extender: Tito Dine in Treatment: 1 Active Inactive ` Abuse / Safety / Falls / Self Care Management Nursing Diagnoses: Potential for falls Goals: Patient will not experience any injury related to falls Date Initiated: 05/17/2017 Target Resolution Date: 08/05/2017 Goal Status: Active Interventions: Assess fall risk on admission and as needed Notes: ` Orientation to the Wound Care Program Nursing Diagnoses: Knowledge deficit related to the wound healing center program Goals: Patient/caregiver will verbalize understanding of the [place] Date Initiated: 05/17/2017 Target Resolution Date: 08/05/2017 Goal Status: Active Interventions: Provide education on orientation to the wound center Notes: ` Wound/Skin Impairment Nursing Diagnoses: Impaired tissue integrity Goals: Ulcer/skin breakdown will heal within 14 weeks Date Initiated: 05/17/2017 Target Resolution Date: 08/05/2017 Goal Status: Active Interventions: BALIN, VANDEGRIFT ([id number]) Assess patient/caregiver ability to obtain necessary supplies Assess patient/caregiver ability to perform ulcer/skin care regimen upon admission and as needed Assess ulceration(s) every visit Notes: Electronic Signature(s) Signed: 05/24/2017 5:26:58 PM By: Montey Hora Entered By: Montey Hora on 05/24/2017 11:24:06 [name], [name] Kitchen  ([id number]) -------------------------------------------------------------------------------- Pain Assessment Details Patient Name: [name]. Date of Service: 05/24/2017 11:00 AM Medical Record Number: [id number] Patient Account Number: [id number] Date of Birth/Sex: 11/16/1934 (82 y.o. Male) Treating RN: Montey Hora Primary Care Yeila Morro: [name] Other Clinician: Referring Jiles Goya: [name] Treating Aneesha Holloran/Extender: Tito Dine in Treatment: 1 Active Problems Location of Pain Severity and Description of Pain Patient Has Paino No Site Locations Pain Management and Medication Current Pain Management: Notes Topical or injectable lidocaine is offered to patient for acute pain when surgical debridement is performed. If needed, Patient is instructed to use over the counter pain medication for the following 24-48 hours after debridement. Wound care MDs do not prescribed pain medications. Patient has chronic pain or uncontrolled pain. Patient has been instructed to make an appointment with their Primary Care Physician for pain management. Electronic Signature(s) Signed: 05/24/2017 5:26:58 PM By: Montey Hora Entered By: Montey Hora on 05/24/2017 11:16:34 [name], [name] ([id number]) -------------------------------------------------------------------------------- Patient/Caregiver Education Details Patient Name: [name] Date of Service: 05/24/2017 11:00 AM Medical Record Number: [id number] Patient Account Number: [id number] Date of Birth/Gender: 09-27-1934 (82 y.o. Male) Treating RN: Montey Hora Primary Care Physician: [name] Other Clinician: Referring Physician: [name] Treating Physician/Extender: Tito Dine in Treatment: 1 Education Assessment  Education Provided To: Patient Education Topics Provided Wound/Skin Impairment: Handouts: Other: wound care as ordered Methods: Demonstration,  Explain/Verbal Responses: State content correctly Electronic Signature(s) Signed: 05/24/2017 5:26:58 PM By: Montey Hora Entered By: Montey Hora on 05/24/2017 11:25:01 [name], [name] ([id number]) -------------------------------------------------------------------------------- Wound Assessment Details Patient Name: [name]. Date of Service: 05/24/2017 11:00 AM Medical Record Number: [id number] Patient Account Number: [id number] Date of Birth/Sex: Dec 12, 1934 (82 y.o. Male) Treating RN: Montey Hora Primary Care Mckynlee Luse: [name] Other Clinician: Referring Chealsey Miyamoto: [name] Treating Kaleisha Bhargava/Extender: Tito Dine in Treatment: 1 Wound Status Wound Number: 1 Primary Trauma, Other Etiology: Wound Location: Right Lower Leg - Anterior Wound Status: Open Wounding Event: Trauma Comorbid Deep Vein Thrombosis, Hypertension, Date Acquired: 03/29/2017 History: Osteoarthritis Weeks Of Treatment: 1 Clustered Wound: No Photos Photo Uploaded By: Montey Hora on 05/24/2017 13:32:01 Wound Measurements Length: (cm) 1.4 Width: (cm) 0.4 Depth: (cm) 0.1 Area: (cm) 0.44 Volume: (cm) 0.044 % Reduction in Area: 49.1% % Reduction in Volume: 48.8% Epithelialization: None Tunneling: No Undermining: No Wound Description Full Thickness With Exposed Support Classification: Structures Wound Margin: Flat and Intact Exudate Large Amount: Exudate Type: Serous Exudate Color: amber Foul Odor After Cleansing: No Slough/Fibrino Yes Wound Bed Granulation Amount: Large (67-100%) Exposed Structure Granulation Quality: Red Fascia Exposed: No Necrotic Amount: Small (1-33%) Fat Layer (Subcutaneous Tissue) Exposed: Yes Necrotic Quality: Adherent Slough Tendon Exposed: No Muscle Exposed: No Joint Exposed: No Bone Exposed: No [place], [name]. ([id number]) Periwound Skin Texture Texture Color No Abnormalities Noted: No No Abnormalities Noted:  No Callus: No Atrophie Blanche: No Crepitus: No Cyanosis: No Excoriation: No Ecchymosis: No Induration: No Erythema: Yes Rash: Yes Erythema Location: Circumferential Scarring: No Hemosiderin Staining: No Mottled: No Moisture Pallor: No No Abnormalities Noted: No Rubor: No Dry / Scaly: No Maceration: No Temperature / Pain Temperature: No Abnormality Wound Preparation Ulcer Cleansing: Rinsed/Irrigated with Saline Topical Anesthetic Applied: Other: lidocaine 4%, Treatment Notes Wound #1 (Right, Anterior Lower Leg) 1. Cleansed with: Clean wound with Normal Saline 2. Anesthetic Topical Lidocaine 4% cream to wound bed prior to debridement 4. Dressing Applied: Prisma Ag 5. Secondary Dressing Applied Bordered Foam Dressing Dry Gauze Electronic Signature(s) Signed: 05/24/2017 5:26:58 PM By: Montey Hora Entered By: Montey Hora on 05/24/2017 11:19:08 [name], [name] ([id number]) -------------------------------------------------------------------------------- Vitals Details Patient Name: [name] Date of Service: 05/24/2017 11:00 AM Medical Record Number: [id number] Patient Account Number: [id number] Date of Birth/Sex: 1934-12-14 (82 y.o. Male) Treating RN: Montey Hora Primary Care Charity Tessier: [name] Other Clinician: Referring Virlee Stroschein: [name] Treating Vestal Markin/Extender: Tito Dine in Treatment: 1 Vital Signs Time Taken: 11:16 Temperature (F): 98.4 Height (in): 72 Pulse (bpm): 52 Weight (lbs): 187 Respiratory Rate (breaths/min): 18 Body Mass Index (BMI): 25.4 Blood Pressure (mmHg): 149/69 Reference Range: 80 - 120 mg / dl Electronic Signature(s) Signed: 05/24/2017 5:26:58 PM By: Montey Hora Entered By: Montey Hora on 05/24/2017 11:16:53

## 2017-05-26 NOTE — Progress Notes (Signed)
ALGERNON, MUNDIE ([id number]) Visit Report for 05/24/2017 HPI Details Patient Name: [name], [name]. Date of Service: 05/24/2017 11:00 AM Medical Record Number: [id number] Patient Account Number: [id number] Date of Birth/Sex: 16-Feb-1935 (82 y.o. Male) Treating RN: Montey Hora Primary Care Provider: [name] Other Clinician: Referring Provider: [name] Treating Provider/Extender: Tito Dine in Treatment: 1 History of Present Illness HPI Description: 05/17/17; fairly healthy 82 year old who struck his right lower leg while getting off a tractor at the beginning of September. When he presented initially after 10 days to his primary physician on 04/13/17 he was felt to have concomitant cellulitis. He also had wounds on his arm and his upper right leg that healed however he has been left with a nonhealing wound on the right mid lower leg just along the tibia. He has had several courses of antibiotics for concomitant cellulitis including Keflex, cefuroxime and most recently doxycycline which she is just finishing. Currently using topical antibiotics. The patient is not a diabetic. He does have a history of vein stripping in 2012 on the right side. He is on prednisone 10 mg every other day for unclear reasons as far as the patient knows this is for "pain". ABIs in our clinic were 1.46 on the right and 1.47 on the left noncompressible 05/24/17; area on the right lower leg is smaller with a continued healthy base. He shows as 2 small areas on the right bicep area and right deltoid area which are apparently traumas this week these look very superficial Electronic Signature(s) Signed: 05/24/2017 4:56:05 PM By: Linton Ham MD Entered By: Linton Ham on 05/24/2017 12:03:40 Searls, Leafy Dunn ([id number]) -------------------------------------------------------------------------------- Physical Exam Details Patient Name: [name]. Date of Service: 05/24/2017  11:00 AM Medical Record Number: [id number] Patient Account Number: [id number] Date of Birth/Sex: 09-21-1934 (82 y.o. Male) Treating RN: Montey Hora Primary Care Provider: [name] Other Clinician: Referring Provider: [name] Treating Provider/Extender: Tito Dine in Treatment: 1 Constitutional Patient is hypertensive.. Pulse regular and within target range for patient.Marland Kitchen Respirations regular, non-labored and within target range.. Temperature is normal and within the target range for the patient.Marland Kitchen appears in no distress. Eyes Conjunctivae clear. No discharge. Cardiovascular Pedal pulses palpable and strong bilaterally.. Chronic venous insufficiency.. Lymphatic None palpable in the right popliteal or inguinal area. Psychiatric No evidence of depression, anxiety, or agitation. Calm, cooperative, and communicative. Appropriate interactions and affect.. Notes Wound exam; linear wound on the right anterior leg is down in dimensions. Looks healthy no debridement was required. No surrounding erythema is seen. The lack of surrounding erythema is an improvement from last time oOn the right bicep and right deltoid he has 2 skin tears which she said are traumatic I did not go into details about this but we'll check these next time Electronic Signature(s) Signed: 05/24/2017 4:56:05 PM By: Linton Ham MD Entered By: Linton Ham on 05/24/2017 12:11:25 Coard, Leafy Dunn ([id number]) -------------------------------------------------------------------------------- Physician Orders Details Patient Name: [name]. Date of Service: 05/24/2017 11:00 AM Medical Record Number: [id number] Patient Account Number: [id number] Date of Birth/Sex: 02/20/1935 (82 y.o. Male) Treating RN: Montey Hora Primary Care Provider: [name] Other Clinician: Referring Provider: [name] Treating Provider/Extender: Tito Dine in Treatment: 1 Verbal /  Phone Orders: No Diagnosis Coding Wound Cleansing Wound #1 Right,Anterior Lower Leg o Clean wound with Normal Saline. o May Shower, gently pat wound dry prior to applying new dressing. Anesthetic Wound #1 Right,Anterior Lower Leg o Topical Lidocaine 4% cream applied to  wound bed prior to debridement Primary Wound Dressing Wound #1 Right,Anterior Lower Leg o Prisma Ag Secondary Dressing Wound #1 Right,Anterior Lower Leg o Dry Gauze o Boardered Foam Dressing Dressing Change Frequency Wound #1 Right,Anterior Lower Leg o Change dressing every other day. Follow-up Appointments Wound #1 Right,Anterior Lower Leg o Return Appointment in 1 week. Edema Control Wound #1 Right,Anterior Lower Leg o Elevate legs to the level of the heart and pump ankles as often as possible Additional Orders / Instructions Wound #1 Right,Anterior Lower Leg o Increase protein intake. o Other: - Please add vitamin A, vitamin C and zinc supplements to your diet Electronic Signature(s) Signed: 05/24/2017 4:56:05 PM By: Linton Ham MD Signed: 05/24/2017 5:26:58 PM By: Montey Hora Entered By: Montey Hora on 05/24/2017 11:37:07 Twist, Leafy Dunn ([id number]) Victorino, Chriss VMarland Kitchen ([id number]) -------------------------------------------------------------------------------- Problem List Details Patient Name: [name]. Date of Service: 05/24/2017 11:00 AM Medical Record Number: [id number] Patient Account Number: [id number] Date of Birth/Sex: 10-05-1934 (82 y.o. Male) Treating RN: Montey Hora Primary Care Provider: [name] Other Clinician: Referring Provider: [name] Treating Provider/Extender: Tito Dine in Treatment: 1 Active Problems ICD-10 Encounter Code Description Active Date Diagnosis S81.811D Laceration without foreign body, right lower leg, subsequent 05/17/2017 Yes encounter L97.811 Non-pressure chronic ulcer of other part of right  lower leg limited to 05/17/2017 Yes breakdown of skin I87.311 Chronic venous hypertension (idiopathic) with ulcer of right lower 05/17/2017 Yes extremity Inactive Problems Resolved Problems Electronic Signature(s) Signed: 05/24/2017 4:56:05 PM By: Linton Ham MD Entered By: Linton Ham on 05/24/2017 12:02:48 Rohleder, Leafy Dunn ([id number]) -------------------------------------------------------------------------------- Progress Note Details Patient Name: [name]. Date of Service: 05/24/2017 11:00 AM Medical Record Number: [id number] Patient Account Number: [id number] Date of Birth/Sex: 09-19-1934 (82 y.o. Male) Treating RN: Montey Hora Primary Care Provider: [name] Other Clinician: Referring Provider: [name] Treating Provider/Extender: Tito Dine in Treatment: 1 Subjective History of Present Illness (HPI) 05/17/17; fairly healthy 82 year old who struck his right lower leg while getting off a tractor at the beginning of September. When he presented initially after 10 days to his primary physician on 04/13/17 he was felt to have concomitant cellulitis. He also had wounds on his arm and his upper right leg that healed however he has been left with a nonhealing wound on the right mid lower leg just along the tibia. He has had several courses of antibiotics for concomitant cellulitis including Keflex, cefuroxime and most recently doxycycline which she is just finishing. Currently using topical antibiotics. The patient is not a diabetic. He does have a history of vein stripping in 2012 on the right side. He is on prednisone 10 mg every other day for unclear reasons as far as the patient knows this is for "pain". ABIs in our clinic were 1.46 on the right and 1.47 on the left noncompressible 05/24/17; area on the right lower leg is smaller with a continued healthy base. He shows as 2 small areas on the right bicep area and right deltoid area which  are apparently traumas this week these look very superficial Objective Constitutional Patient is hypertensive.. Pulse regular and within target range for patient.Marland Kitchen Respirations regular, non-labored and within target range.. Temperature is normal and within the target range for the patient.Marland Kitchen appears in no distress. Vitals Time Taken: 11:16 AM, Height: 72 in, Weight: 187 lbs, BMI: 25.4, Temperature: 98.4 F, Pulse: 52 bpm, Respiratory Rate: 18 breaths/min, Blood Pressure: 149/69 mmHg. Eyes Conjunctivae clear. No discharge. Cardiovascular Pedal pulses palpable and  strong bilaterally.. Chronic venous insufficiency.. Lymphatic None palpable in the right popliteal or inguinal area. Psychiatric No evidence of depression, anxiety, or agitation. Calm, cooperative, and communicative. Appropriate interactions and affect.. General Notes: Wound exam; linear wound on the right anterior leg is down in dimensions. Looks healthy no debridement Mundis, Miquan V. ([id number]) was required. No surrounding erythema is seen. The lack of surrounding erythema is an improvement from last time On the right bicep and right deltoid he has 2 skin tears which she said are traumatic I did not go into details about this but we'll check these next time Integumentary (Hair, Skin) Wound #1 status is Open. Original cause of wound was Trauma. The wound is located on the Right,Anterior Lower Leg. The wound measures 1.4cm length x 0.4cm width x 0.1cm depth; 0.44cm^2 area and 0.044cm^3 volume. There is Fat Layer (Subcutaneous Tissue) Exposed exposed. There is no tunneling or undermining noted. There is a large amount of serous drainage noted. The wound margin is flat and intact. There is large (67-100%) red granulation within the wound bed. There is a small (1-33%) amount of necrotic tissue within the wound bed including Adherent Slough. The periwound skin appearance exhibited: Rash, Erythema. The periwound skin appearance did not  exhibit: Callus, Crepitus, Excoriation, Induration, Scarring, Dry/Scaly, Maceration, Atrophie Blanche, Cyanosis, Ecchymosis, Hemosiderin Staining, Mottled, Pallor, Rubor. The surrounding wound skin color is noted with erythema which is circumferential. Periwound temperature was noted as No Abnormality. Assessment Active Problems ICD-10 S81.811D - Laceration without foreign body, right lower leg, subsequent encounter L97.811 - Non-pressure chronic ulcer of other part of right lower leg limited to breakdown of skin I87.311 - Chronic venous hypertension (idiopathic) with ulcer of right lower extremity Plan Wound Cleansing: Wound #1 Right,Anterior Lower Leg: Clean wound with Normal Saline. May Shower, gently pat wound dry prior to applying new dressing. Anesthetic: Wound #1 Right,Anterior Lower Leg: Topical Lidocaine 4% cream applied to wound bed prior to debridement Primary Wound Dressing: Wound #1 Right,Anterior Lower Leg: Prisma Ag Secondary Dressing: Wound #1 Right,Anterior Lower Leg: Dry Gauze Boardered Foam Dressing Dressing Change Frequency: Wound #1 Right,Anterior Lower Leg: Change dressing every other day. Follow-up Appointments: Wound #1 Right,Anterior Lower Leg: Return Appointment in 1 week. Edema Control: Wound #1 Right,Anterior Lower Leg: Elevate legs to the level of the heart and pump ankles as often as possible Additional Orders / InstructionsLUKKA, BLACK V. ([id number]) Wound #1 Right,Anterior Lower Leg: Increase protein intake. Other: - Please add vitamin A, vitamin C and zinc supplements to your diet #1 continue with silver collagen and border foam dressing to the right anterior leg which the patient is changing himself. This looks as though it's progressing towards closure #2 smallo Skin tears on the right arm and deltoid area. I'll look at these again next week they look superficial but I'll need to explore this a little more they don't heal Electronic  Signature(s) Signed: 05/24/2017 4:56:05 PM By: Linton Ham MD Entered By: Linton Ham on 05/24/2017 12:12:18 Seifer, Leafy Dunn ([id number]) -------------------------------------------------------------------------------- SuperBill Details Patient Name: [name]. Date of Service: 05/24/2017 Medical Record Number: [id number] Patient Account Number: [id number] Date of Birth/Sex: [birth year] (82 y.o. Male) Treating RN: Montey Hora Primary Care Provider: [name] Other Clinician: Referring Provider: [name] Treating Provider/Extender: Tito Dine in Treatment: 1 Diagnosis Coding ICD-10 Codes Code Description [phone] Laceration without foreign body, right lower leg, subsequent encounter L97.811 Non-pressure chronic ulcer of other part of right lower leg limited to breakdown of skin I87.311  Chronic venous hypertension (idiopathic) with ulcer of right lower extremity Facility Procedures CPT4 Code: [id number] Description: 09643 - WOUND CARE VISIT-LEV 3 EST PT Modifier: Quantity: 1 Physician Procedures CPT4 Code Description: [id number] 37543 - WC PHYS LEVEL 3 - EST PT ICD-10 Diagnosis Description S81.811D Laceration without foreign body, right lower leg, subsequent enc L97.811 Non-pressure chronic ulcer of other part of right lower leg limi Modifier: ounter ted to breakdown Quantity: 1 of skin Electronic Signature(s) Signed: 05/24/2017 1:14:36 PM By: Montey Hora Signed: 05/24/2017 4:56:05 PM By: Linton Ham MD Entered By: Montey Hora on 05/24/2017 13:14:35

## 2017-05-31 ENCOUNTER — Encounter: Payer: Medicare Other | Admitting: Internal Medicine

## 2017-05-31 DIAGNOSIS — I10 Essential (primary) hypertension: Secondary | ICD-10-CM | POA: Diagnosis not present

## 2017-05-31 DIAGNOSIS — S81801A Unspecified open wound, right lower leg, initial encounter: Secondary | ICD-10-CM | POA: Diagnosis not present

## 2017-05-31 DIAGNOSIS — L97811 Non-pressure chronic ulcer of other part of right lower leg limited to breakdown of skin: Secondary | ICD-10-CM | POA: Diagnosis not present

## 2017-05-31 DIAGNOSIS — Z86718 Personal history of other venous thrombosis and embolism: Secondary | ICD-10-CM | POA: Diagnosis not present

## 2017-05-31 DIAGNOSIS — M1990 Unspecified osteoarthritis, unspecified site: Secondary | ICD-10-CM | POA: Diagnosis not present

## 2017-05-31 DIAGNOSIS — I87311 Chronic venous hypertension (idiopathic) with ulcer of right lower extremity: Secondary | ICD-10-CM | POA: Diagnosis not present

## 2017-05-31 DIAGNOSIS — S81811D Laceration without foreign body, right lower leg, subsequent encounter: Secondary | ICD-10-CM | POA: Diagnosis not present

## 2017-06-01 NOTE — Progress Notes (Signed)
LLEYTON, BYERS ([id number]) Visit Report for 05/31/2017 Arrival Information Details Patient Name: [name], [name]. Date of Service: 05/31/2017 2:30 PM Medical Record Number: [id number] Patient Account Number: [id number] Date of Birth/Sex: 1934/12/05 (82 y.o. Male) Treating RN: Montey Hora Primary Care Osmin Welz: [name] Other Clinician: Referring Mart Colpitts: [name] Treating Barbi Kumagai/Extender: Tito Dine in Treatment: 2 Visit Information History Since Last Visit Added or deleted any medications: No Patient Arrived: Ambulatory Any new allergies or adverse reactions: No Arrival Time: 14:41 Had a fall or experienced change in No Accompanied By: self activities of daily living that may affect Transfer Assistance: None risk of falls: Patient Identification Verified: Yes Signs or symptoms of abuse/neglect since last visito No Secondary Verification Process Completed: Yes Hospitalized since last visit: No Has Dressing in Place as Prescribed: Yes Pain Present Now: No Electronic Signature(s) Signed: 05/31/2017 4:37:56 PM By: Montey Hora Entered By: Montey Hora on 05/31/2017 14:42:11 Coppin, Leafy Half ([id number]) -------------------------------------------------------------------------------- Encounter Discharge Information Details Patient Name: [name]. Date of Service: 05/31/2017 2:30 PM Medical Record Number: [id number] Patient Account Number: [id number] Date of Birth/Sex: [birth year] (82 y.o. Male) Treating RN: Montey Hora Primary Care Thuy Atilano: [name] Other Clinician: Referring Greggory Safranek: [name] Treating Eniya Cannady/Extender: Tito Dine in Treatment: 2 Encounter Discharge Information Items Discharge Pain Level: 0 Discharge Condition: Stable Ambulatory Status: Ambulatory Discharge Destination: Home Transportation: Private Auto Accompanied By: self Schedule Follow-up Appointment: Yes Medication  Reconciliation completed and No provided to Patient/Care Kofi Murrell: Provided on Clinical Summary of Care: 05/31/2017 Form Type Recipient Paper Patient CM Electronic Signature(s) Signed: 05/31/2017 4:37:56 PM By: Montey Hora Entered By: Montey Hora on 05/31/2017 15:54:41 Fruchter, Leafy Half ([id number]) -------------------------------------------------------------------------------- Lower Extremity Assessment Details Patient Name: [name]. Date of Service: 05/31/2017 2:30 PM Medical Record Number: [id number] Patient Account Number: [id number] Date of Birth/Sex: 1934-11-26 (82 y.o. Male) Treating RN: Montey Hora Primary Care Tehya Leath: [name] Other Clinician: Referring Othelia Riederer: [name] Treating Ethelle Ola/Extender: Tito Dine in Treatment: 2 Vascular Assessment Pulses: Dorsalis Pedis Palpable: [Right:Yes] Posterior Tibial Extremity colors, hair growth, and conditions: Extremity Color: [Right:Normal] Hair Growth on Extremity: [Right:Yes] Temperature of Extremity: [Right:Warm] Capillary Refill: [Right:< 3 seconds] Electronic Signature(s) Signed: 05/31/2017 4:37:56 PM By: Montey Hora Entered By: Montey Hora on 05/31/2017 14:44:21 Batten, Sydney V. ([id number]) -------------------------------------------------------------------------------- Multi Wound Chart Details Patient Name: [name]. Date of Service: 05/31/2017 2:30 PM Medical Record Number: [id number] Patient Account Number: [id number] Date of Birth/Sex: 08-15-1934 (82 y.o. Male) Treating RN: Montey Hora Primary Care Merrick Maggio: [name] Other Clinician: Referring Cashel Bellina: [name] Treating Jonathan Corpus/Extender: Tito Dine in Treatment: 2 Vital Signs Height(in): 72 Pulse(bpm): 65 Weight(lbs): 187 Blood Pressure(mmHg): 133/75 Body Mass Index(BMI): 25 Temperature(F): 98.3 Respiratory Rate 18 (breaths/min): Photos: [N/A:N/A] Wound  Location: Right Lower Leg - Anterior N/A N/A Wounding Event: Trauma N/A N/A Primary Etiology: Trauma, Other N/A N/A Comorbid History: Deep Vein Thrombosis, N/A N/A Hypertension, Osteoarthritis Date Acquired: 03/29/2017 N/A N/A Weeks of Treatment: 2 N/A N/A Wound Status: Open N/A N/A Measurements L x W x D 0.7x0.4x0.1 N/A N/A (cm) Area (cm) : 0.22 N/A N/A Volume (cm) : 0.022 N/A N/A % Reduction in Area: 74.50% N/A N/A % Reduction in Volume: 74.40% N/A N/A Classification: Full Thickness With Exposed N/A N/A Support Structures Exudate Amount: Large N/A N/A Exudate Type: Serous N/A N/A Exudate Color: amber N/A N/A Wound Margin: Flat and Intact N/A N/A Granulation Amount: Large (67-100%) N/A N/A Granulation Quality: Red N/A N/A Necrotic Amount:  Small (1-33%) N/A N/A Exposed Structures: Fat Layer (Subcutaneous N/A N/A Tissue) Exposed: Yes Fascia: No Tendon: No Muscle: No Goerke, Erinn V. ([id number]) Joint: No Bone: No Epithelialization: None N/A N/A Debridement: Debridement ([id number]) N/A N/A Pre-procedure 15:20 N/A N/A Verification/Time Out Taken: Pain Control: Lidocaine 4% Topical Solution N/A N/A Tissue Debrided: Fibrin/Slough, Subcutaneous N/A N/A Level: Skin/Subcutaneous Tissue N/A N/A Debridement Area (sq cm): 0.28 N/A N/A Instrument: Curette N/A N/A Bleeding: Minimum N/A N/A Hemostasis Achieved: Pressure N/A N/A Procedural Pain: 0 N/A N/A Post Procedural Pain: 0 N/A N/A Debridement Treatment Procedure was tolerated well N/A N/A Response: Post Debridement 0.7x0.4x0.2 N/A N/A Measurements L x W x D (cm) Post Debridement Volume: 0.044 N/A N/A (cm) Periwound Skin Texture: Rash: Yes N/A N/A Excoriation: No Induration: No Callus: No Crepitus: No Scarring: No Periwound Skin Moisture: Maceration: No N/A N/A Dry/Scaly: No Periwound Skin Color: Erythema: Yes N/A N/A Atrophie Blanche: No Cyanosis: No Ecchymosis: No Hemosiderin Staining: No Mottled:  No Pallor: No Rubor: No Erythema Location: Circumferential N/A N/A Temperature: No Abnormality N/A N/A Tenderness on Palpation: No N/A N/A Wound Preparation: Ulcer Cleansing: N/A N/A Rinsed/Irrigated with Saline Topical Anesthetic Applied: Other: lidocaine 4% Procedures Performed: Debridement N/A N/A Treatment Notes Wound #1 (Right, Anterior Lower Leg) 1. Cleansed with: Clean wound with Normal Saline 2. Anesthetic Topical Lidocaine 4% cream to wound bed prior to debridement 4. Dressing Applied: Hydrafera Blue Borghi, Kunkle ([id number]) 5. Secondary Dressing Applied Bordered Foam Dressing Electronic Signature(s) Signed: 05/31/2017 4:48:36 PM By: Linton Ham MD Previous Signature: 05/31/2017 4:37:56 PM Version By: Montey Hora Entered By: Linton Ham on 05/31/2017 16:39:21 Fowle, Leafy Half ([id number]) -------------------------------------------------------------------------------- Multi-Disciplinary Care Plan Details Patient Name: [name] Date of Service: 05/31/2017 2:30 PM Medical Record Number: [id number] Patient Account Number: [id number] Date of Birth/Sex: 1934-09-02 (82 y.o. Male) Treating RN: Montey Hora Primary Care Durante Violett: [name] Other Clinician: Referring Josilynn Losh: [name] Treating Dashanna Kinnamon/Extender: Tito Dine in Treatment: 2 Active Inactive ` Abuse / Safety / Falls / Self Care Management Nursing Diagnoses: Potential for falls Goals: Patient will not experience any injury related to falls Date Initiated: 05/17/2017 Target Resolution Date: 08/05/2017 Goal Status: Active Interventions: Assess fall risk on admission and as needed Notes: ` Orientation to the Wound Care Program Nursing Diagnoses: Knowledge deficit related to the wound healing center program Goals: Patient/caregiver will verbalize understanding of the [facility] Date Initiated: 05/17/2017 Target Resolution Date:  08/05/2017 Goal Status: Active Interventions: Provide education on orientation to the wound center Notes: ` Wound/Skin Impairment Nursing Diagnoses: Impaired tissue integrity Goals: Ulcer/skin breakdown will heal within 14 weeks Date Initiated: 05/17/2017 Target Resolution Date: 08/05/2017 Goal Status: Active Interventions: OLUWATOSIN, HIGGINSON ([id number]) Assess patient/caregiver ability to obtain necessary supplies Assess patient/caregiver ability to perform ulcer/skin care regimen upon admission and as needed Assess ulceration(s) every visit Notes: Electronic Signature(s) Signed: 05/31/2017 4:37:56 PM By: Montey Hora Entered By: Montey Hora on 05/31/2017 15:24:44 Wurtz, Khaliq VMarland Kitchen ([id number]) -------------------------------------------------------------------------------- Pain Assessment Details Patient Name: [name]. Date of Service: 05/31/2017 2:30 PM Medical Record Number: [id number] Patient Account Number: [id number] Date of Birth/Sex: Nov 13, 1934 (82 y.o. Male) Treating RN: Montey Hora Primary Care Treyshon Buchanon: [name] Other Clinician: Referring Seddrick Flax: [name] Treating Zakhi Dupre/Extender: Tito Dine in Treatment: 2 Active Problems Location of Pain Severity and Description of Pain Patient Has Paino No Site Locations Pain Management and Medication Current Pain Management: Notes Topical or injectable lidocaine is offered to patient for acute pain when  surgical debridement is performed. If needed, Patient is instructed to use over the counter pain medication for the following 24-48 hours after debridement. Wound care MDs do not prescribed pain medications. Patient has chronic pain or uncontrolled pain. Patient has been instructed to make an appointment with their Primary Care Physician for pain management. Electronic Signature(s) Signed: 05/31/2017 4:37:56 PM By: Montey Hora Entered By: Montey Hora on 05/31/2017  14:44:06 Connelley, Leafy Half ([id number]) -------------------------------------------------------------------------------- Patient/Caregiver Education Details Patient Name: [name] Date of Service: 05/31/2017 2:30 PM Medical Record Number: [id number] Patient Account Number: [id number] Date of Birth/Gender: [birth year] (82 y.o. Male) Treating RN: Montey Hora Primary Care Physician: [name] Other Clinician: Referring Physician: [name] Treating Physician/Extender: Tito Dine in Treatment: 2 Education Assessment Education Provided To: Patient Education Topics Provided Wound/Skin Impairment: Handouts: Other: wound care as ordered Methods: Demonstration, Explain/Verbal Responses: State content correctly Electronic Signature(s) Signed: 05/31/2017 4:37:56 PM By: Montey Hora Entered By: Montey Hora on 05/31/2017 15:55:02 Follette, Leafy Half ([id number]) -------------------------------------------------------------------------------- Wound Assessment Details Patient Name: [name]. Date of Service: 05/31/2017 2:30 PM Medical Record Number: [id number] Patient Account Number: [id number] Date of Birth/Sex: 02-Aug-1935 (82 y.o. Male) Treating RN: Montey Hora Primary Care Janard Culp: [name] Other Clinician: Referring Doralee Kocak: [name] Treating Jazlin Tapscott/Extender: Tito Dine in Treatment: 2 Wound Status Wound Number: 1 Primary Trauma, Other Etiology: Wound Location: Right Lower Leg - Anterior Wound Status: Open Wounding Event: Trauma Comorbid Deep Vein Thrombosis, Hypertension, Date Acquired: 03/29/2017 History: Osteoarthritis Weeks Of Treatment: 2 Clustered Wound: No Photos Photo Uploaded By: Montey Hora on 05/31/2017 16:34:26 Wound Measurements Length: (cm) 0.7 Width: (cm) 0.4 Depth: (cm) 0.1 Area: (cm) 0.22 Volume: (cm) 0.022 % Reduction in Area: 74.5% % Reduction in Volume:  74.4% Epithelialization: None Tunneling: No Undermining: No Wound Description Full Thickness With Exposed Support Classification: Structures Wound Margin: Flat and Intact Exudate Large Amount: Exudate Type: Serous Exudate Color: amber Foul Odor After Cleansing: No Slough/Fibrino Yes Wound Bed Granulation Amount: Large (67-100%) Exposed Structure Granulation Quality: Red Fascia Exposed: No Necrotic Amount: Small (1-33%) Fat Layer (Subcutaneous Tissue) Exposed: Yes Necrotic Quality: Adherent Slough Tendon Exposed: No Muscle Exposed: No Joint Exposed: No Bone Exposed: No Quesnel, Herberth V. ([id number]) Periwound Skin Texture Texture Color No Abnormalities Noted: No No Abnormalities Noted: No Callus: No Atrophie Blanche: No Crepitus: No Cyanosis: No Excoriation: No Ecchymosis: No Induration: No Erythema: Yes Rash: Yes Erythema Location: Circumferential Scarring: No Hemosiderin Staining: No Mottled: No Moisture Pallor: No No Abnormalities Noted: No Rubor: No Dry / Scaly: No Maceration: No Temperature / Pain Temperature: No Abnormality Wound Preparation Ulcer Cleansing: Rinsed/Irrigated with Saline Topical Anesthetic Applied: Other: lidocaine 4%, Treatment Notes Wound #1 (Right, Anterior Lower Leg) 1. Cleansed with: Clean wound with Normal Saline 2. Anesthetic Topical Lidocaine 4% cream to wound bed prior to debridement 4. Dressing Applied: Hydrafera Blue 5. Secondary Dressing Applied Bordered Foam Dressing Electronic Signature(s) Signed: 05/31/2017 4:37:56 PM By: Montey Hora Entered By: Montey Hora on 05/31/2017 15:24:35 Doten, Leafy Half ([id number]) -------------------------------------------------------------------------------- Vitals Details Patient Name: [name]. Date of Service: 05/31/2017 2:30 PM Medical Record Number: [id number] Patient Account Number: [id number] Date of Birth/Sex: 04-Apr-1935 (82 y.o. Male) Treating RN: Montey Hora Primary Care Kamari Buch: [name] Other Clinician: Referring Erina Hamme: [name] Treating Nesreen Albano/Extender: Tito Dine in Treatment: 2 Vital Signs Time Taken: 14:48 Temperature (F): 98.3 Height (in): 72 Pulse (bpm): 65 Weight (lbs): 187 Respiratory Rate (breaths/min): 18 Body Mass Index (BMI): 25.4 Blood  Pressure (mmHg): 133/75 Reference Range: 80 - 120 mg / dl Electronic Signature(s) Signed: 05/31/2017 4:37:56 PM By: Montey Hora Entered By: Montey Hora on 05/31/2017 14:48:46

## 2017-06-03 NOTE — Progress Notes (Signed)
KYROLLOS, CORDELL ([id number]) Visit Report for 05/31/2017 Debridement Details Patient Name: [name], [name]. Date of Service: 05/31/2017 2:30 PM Medical Record Number: [id number] Patient Account Number: [id number] Date of Birth/Sex: 04-Apr-1935 (82 y.o. Male) Treating RN: Montey Hora Primary Care Provider: [name] Other Clinician: Referring Provider: [name] Treating Provider/Extender: Tito Dine in Treatment: 2 Debridement Performed for Wound #1 Right,Anterior Lower Leg Assessment: Performed By: Physician Ricard Dillon, MD Debridement: Debridement Pre-procedure Verification/Time Yes - 15:20 Out Taken: Start Time: 15:20 Pain Control: Lidocaine 4% Topical Solution Level: Skin/Subcutaneous Tissue Total Area Debrided (L x W): 0.7 (cm) x 0.4 (cm) = 0.28 (cm) Tissue and other material Viable, Non-Viable, Fibrin/Slough, Subcutaneous debrided: Instrument: Curette Bleeding: Minimum Hemostasis Achieved: Pressure End Time: 15:23 Procedural Pain: 0 Post Procedural Pain: 0 Response to Treatment: Procedure was tolerated well Post Debridement Measurements of Total Wound Length: (cm) 0.7 Width: (cm) 0.4 Depth: (cm) 0.2 Volume: (cm) 0.044 Character of Wound/Ulcer Post Debridement: Improved Post Procedure Diagnosis Same as Pre-procedure Electronic Signature(s) Signed: 05/31/2017 4:48:36 PM By: Linton Ham MD Signed: 06/01/2017 4:29:50 PM By: Montey Hora Previous Signature: 05/31/2017 4:37:56 PM Version By: Montey Hora Entered By: Linton Ham on 05/31/2017 16:39:39 Cordone, Leafy Half ([id number]) -------------------------------------------------------------------------------- HPI Details Patient Name: [name]. Date of Service: 05/31/2017 2:30 PM Medical Record Number: [id number] Patient Account Number: [id number] Date of Birth/Sex: Aug 30, 1934 (82 y.o. Male) Treating RN: Montey Hora Primary Care Provider: [name]  Other Clinician: Referring Provider: [name] Treating Provider/Extender: Tito Dine in Treatment: 2 History of Present Illness HPI Description: 05/17/17; fairly healthy 82 year old who struck his right lower leg while getting off a tractor at the beginning of September. When he presented initially after 10 days to his primary physician on 04/13/17 he was felt to have concomitant cellulitis. He also had wounds on his arm and his upper right leg that healed however he has been left with a nonhealing wound on the right mid lower leg just along the tibia. He has had several courses of antibiotics for concomitant cellulitis including Keflex, cefuroxime and most recently doxycycline which she is just finishing. Currently using topical antibiotics. The patient is not a diabetic. He does have a history of vein stripping in 2012 on the right side. He is on prednisone 10 mg every other day for unclear reasons as far as the patient knows this is for "pain". ABIs in our clinic were 1.46 on the right and 1.47 on the left noncompressible 05/24/17; area on the right lower leg is smaller with a continued healthy base. He shows as 2 small areas on the right bicep area and right deltoid area which are apparently traumas this week these look very superficial 05/21/17; right lower leg traumatic wound had a thick adherent nonviable surface which was removed. The biceps and deltoid area from last week are healed however he has a new laceration over the right biceps area. He tells me he traumatizes sonic gait and pulled the skin back over Electronic Signature(s) Signed: 05/31/2017 4:48:36 PM By: Linton Ham MD Entered By: Linton Ham on 05/31/2017 16:40:24 [name], Leafy Half ([id number]) -------------------------------------------------------------------------------- Physical Exam Details Patient Name: [name]. Date of Service: 05/31/2017 2:30 PM Medical Record Number:  [id number] Patient Account Number: [id number] Date of Birth/Sex: 05-15-1935 (82 y.o. Male) Treating RN: Montey Hora Primary Care Provider: [name] Other Clinician: Referring Provider: [name] Treating Provider/Extender: Tito Dine in Treatment: 2 Constitutional Sitting or standing Blood Pressure is within target  range for patient.. Pulse regular and within target range for patient.Marland Kitchen Respirations regular, non-labored and within target range.. Temperature is normal and within the target range for the patient.Marland Kitchen appears in no distress. Notes Wound exam; linear wound on the right anterior leg required debridement with a #3 curet of necrotic surface tissue. What is underneath actually cleans up quite nicely. Smaller healthy looking with no surrounding infection oHe has a linear skin tear on the right biceps area with clearly threatened skin above this. We applied a nonadherent dressing Electronic Signature(s) Signed: 05/31/2017 4:48:36 PM By: Linton Ham MD Entered By: Linton Ham on 05/31/2017 16:41:21 Brushy, Leafy Half ([id number]) -------------------------------------------------------------------------------- Physician Orders Details Patient Name: [name] Date of Service: 05/31/2017 2:30 PM Medical Record Number: [id number] Patient Account Number: [id number] Date of Birth/Sex: 1934-09-13 (82 y.o. Male) Treating RN: Montey Hora Primary Care Provider: [name] Other Clinician: Referring Provider: [name] Treating Provider/Extender: Tito Dine in Treatment: 2 Verbal / Phone Orders: No Diagnosis Coding Wound Cleansing Wound #1 Right,Anterior Lower Leg o Clean wound with Normal Saline. o May Shower, gently pat wound dry prior to applying new dressing. Anesthetic Wound #1 Right,Anterior Lower Leg o Topical Lidocaine 4% cream applied to wound bed prior to debridement Primary Wound Dressing Wound #1  Right,Anterior Lower Leg o Hydrafera Blue Secondary Dressing Wound #1 Right,Anterior Lower Leg o Dry Gauze o Boardered Foam Dressing Dressing Change Frequency Wound #1 Right,Anterior Lower Leg o Change dressing every other day. Follow-up Appointments Wound #1 Right,Anterior Lower Leg o Return Appointment in 1 week. Edema Control Wound #1 Right,Anterior Lower Leg o Elevate legs to the level of the heart and pump ankles as often as possible Additional Orders / Instructions Wound #1 Right,Anterior Lower Leg o Increase protein intake. o Other: - Please add vitamin A, vitamin C and zinc supplements to your diet Electronic Signature(s) Signed: 05/31/2017 4:37:56 PM By: Montey Hora Signed: 05/31/2017 4:48:36 PM By: Linton Ham MD Entered By: Montey Hora on 05/31/2017 15:25:21 Tunks, Leafy Half ([id number]) Argote, Reymundo VMarland Kitchen ([id number]) -------------------------------------------------------------------------------- Problem List Details Patient Name: Vida, Leafy Half. Date of Service: 05/31/2017 2:30 PM Medical Record Number: [id number] Patient Account Number: [id number] Date of Birth/Sex: 06-14-1935 (82 y.o. Male) Treating RN: Montey Hora Primary Care Provider: [name] Other Clinician: Referring Provider: [name] Treating Provider/Extender: Tito Dine in Treatment: 2 Active Problems ICD-10 Encounter Code Description Active Date Diagnosis S81.811D Laceration without foreign body, right lower leg, subsequent 05/17/2017 Yes encounter L97.811 Non-pressure chronic ulcer of other part of right lower leg limited to 05/17/2017 Yes breakdown of skin I87.311 Chronic venous hypertension (idiopathic) with ulcer of right lower 05/17/2017 Yes extremity Inactive Problems Resolved Problems Electronic Signature(s) Signed: 05/31/2017 4:48:36 PM By: Linton Ham MD Entered By: Linton Ham on 05/31/2017 16:39:08 Feigenbaum, Leafy Half  ([id number]) -------------------------------------------------------------------------------- Progress Note Details Patient Name: [name]. Date of Service: 05/31/2017 2:30 PM Medical Record Number: [id number] Patient Account Number: [id number] Date of Birth/Sex: 01/31/1935 (82 y.o. Male) Treating RN: Montey Hora Primary Care Provider: [name] Other Clinician: Referring Provider: [name] Treating Provider/Extender: Tito Dine in Treatment: 2 Subjective History of Present Illness (HPI) 05/17/17; fairly healthy 82 year old who struck his right lower leg while getting off a tractor at the beginning of September. When he presented initially after 10 days to his primary physician on 04/13/17 he was felt to have concomitant cellulitis. He also had wounds on his arm and his upper right leg that healed however he has  been left with a nonhealing wound on the right mid lower leg just along the tibia. He has had several courses of antibiotics for concomitant cellulitis including Keflex, cefuroxime and most recently doxycycline which she is just finishing. Currently using topical antibiotics. The patient is not a diabetic. He does have a history of vein stripping in 2012 on the right side. He is on prednisone 10 mg every other day for unclear reasons as far as the patient knows this is for "pain". ABIs in our clinic were 1.46 on the right and 1.47 on the left noncompressible 05/24/17; area on the right lower leg is smaller with a continued healthy base. He shows as 2 small areas on the right bicep area and right deltoid area which are apparently traumas this week these look very superficial 05/21/17; right lower leg traumatic wound had a thick adherent nonviable surface which was removed. The biceps and deltoid area from last week are healed however he has a new laceration over the right biceps area. He tells me he traumatizes sonic gait and pulled the skin back  over Objective Constitutional Sitting or standing Blood Pressure is within target range for patient.. Pulse regular and within target range for patient.Marland Kitchen Respirations regular, non-labored and within target range.. Temperature is normal and within the target range for the patient.Marland Kitchen appears in no distress. Vitals Time Taken: 2:48 PM, Height: 72 in, Weight: 187 lbs, BMI: 25.4, Temperature: 98.3 F, Pulse: 65 bpm, Respiratory Rate: 18 breaths/min, Blood Pressure: 133/75 mmHg. General Notes: Wound exam; linear wound on the right anterior leg required debridement with a #3 curet of necrotic surface tissue. What is underneath actually cleans up quite nicely. Smaller healthy looking with no surrounding infection He has a linear skin tear on the right biceps area with clearly threatened skin above this. We applied a nonadherent dressing Integumentary (Hair, Skin) Wound #1 status is Open. Original cause of wound was Trauma. The wound is located on the Right,Anterior Lower Leg. The wound measures 0.7cm length x 0.4cm width x 0.1cm depth; 0.22cm^2 area and 0.022cm^3 volume. There is Fat Layer (Subcutaneous Tissue) Exposed exposed. There is no tunneling or undermining noted. There is a large amount of serous drainage noted. The wound margin is flat and intact. There is large (67-100%) red granulation within the wound bed. There is Ogata, Carter V. ([id number]) a small (1-33%) amount of necrotic tissue within the wound bed including Adherent Slough. The periwound skin appearance exhibited: Rash, Erythema. The periwound skin appearance did not exhibit: Callus, Crepitus, Excoriation, Induration, Scarring, Dry/Scaly, Maceration, Atrophie Blanche, Cyanosis, Ecchymosis, Hemosiderin Staining, Mottled, Pallor, Rubor. The surrounding wound skin color is noted with erythema which is circumferential. Periwound temperature was noted as No Abnormality. Assessment Active Problems ICD-10 S81.811D - Laceration without  foreign body, right lower leg, subsequent encounter L97.811 - Non-pressure chronic ulcer of other part of right lower leg limited to breakdown of skin I87.311 - Chronic venous hypertension (idiopathic) with ulcer of right lower extremity Procedures Wound #1 Pre-procedure diagnosis of Wound #1 is a Trauma, Other located on the Right,Anterior Lower Leg . There was a Skin/Subcutaneous Tissue Debridement ([id number]) debridement with total area of 0.28 sq cm performed by Ricard Dillon, MD. with the following instrument(s): Curette to remove Viable and Non-Viable tissue/material including Fibrin/Slough and Subcutaneous after achieving pain control using Lidocaine 4% Topical Solution. A time out was conducted at 15:20, prior to the start of the procedure. A Minimum amount of bleeding was controlled with Pressure. The procedure was  tolerated well with a pain level of 0 throughout and a pain level of 0 following the procedure. Post Debridement Measurements: 0.7cm length x 0.4cm width x 0.2cm depth; 0.044cm^3 volume. Character of Wound/Ulcer Post Debridement is improved. Post procedure Diagnosis Wound #1: Same as Pre-Procedure Plan Wound Cleansing: Wound #1 Right,Anterior Lower Leg: Clean wound with Normal Saline. May Shower, gently pat wound dry prior to applying new dressing. Anesthetic: Wound #1 Right,Anterior Lower Leg: Topical Lidocaine 4% cream applied to wound bed prior to debridement Primary Wound Dressing: Wound #1 Right,Anterior Lower Leg: Hydrafera Blue Secondary Dressing: Wound #1 Right,Anterior Lower Leg: Dry Gauze Boardered Foam Dressing Dressing Change Frequency: Dehn, Trevonte V. ([id number]) Wound #1 Right,Anterior Lower Leg: Change dressing every other day. Follow-up Appointments: Wound #1 Right,Anterior Lower Leg: Return Appointment in 1 week. Edema Control: Wound #1 Right,Anterior Lower Leg: Elevate legs to the level of the heart and pump ankles as often as  possible Additional Orders / Instructions: Wound #1 Right,Anterior Lower Leg: Increase protein intake. Other: - Please add vitamin A, vitamin C and zinc supplements to your diet #1 continue with Hydrofera Blue to the right anterior lower leg with border foam dressing the patient is changing #2 nonadherent to his right biceps area, new laceration this week Electronic Signature(s) Signed: 05/31/2017 4:48:36 PM By: Linton Ham MD Entered By: Linton Ham on 05/31/2017 16:42:07 [place], Leafy Half ([id number]) -------------------------------------------------------------------------------- SuperBill Details Patient Name: [name]. Date of Service: 05/31/2017 Medical Record Number: [id number] Patient Account Number: [id number] Date of Birth/Sex: October 22, 1934 (82 y.o. Male) Treating RN: Montey Hora Primary Care Provider: [name] Other Clinician: Referring Provider: [name] Treating Provider/Extender: Tito Dine in Treatment: 2 Diagnosis Coding ICD-10 Codes Code Description [phone] Laceration without foreign body, right lower leg, subsequent encounter L97.811 Non-pressure chronic ulcer of other part of right lower leg limited to breakdown of skin I87.311 Chronic venous hypertension (idiopathic) with ulcer of right lower extremity Facility Procedures CPT4 Code: [id number] Description: 02774 - DEB SUBQ TISSUE 20 SQ CM/< ICD-10 Diagnosis Description S81.811D Laceration without foreign body, right lower leg, subsequent e Modifier: ncounter Quantity: 1 Physician Procedures CPT4 Code: [id number] Description: 67209 - WC PHYS SUBQ TISS 20 SQ CM ICD-10 Diagnosis Description O70.962E Laceration without foreign body, right lower leg, subsequent e Modifier: ncounter Quantity: 1 Electronic Signature(s) Signed: 05/31/2017 4:48:36 PM By: Linton Ham MD Entered By: Linton Ham on 05/31/2017 16:42:24

## 2017-06-07 ENCOUNTER — Encounter: Payer: Medicare Other | Attending: Internal Medicine | Admitting: Internal Medicine

## 2017-06-07 DIAGNOSIS — X58XXXD Exposure to other specified factors, subsequent encounter: Secondary | ICD-10-CM | POA: Insufficient documentation

## 2017-06-07 DIAGNOSIS — L539 Erythematous condition, unspecified: Secondary | ICD-10-CM | POA: Diagnosis not present

## 2017-06-07 DIAGNOSIS — L97811 Non-pressure chronic ulcer of other part of right lower leg limited to breakdown of skin: Secondary | ICD-10-CM | POA: Diagnosis not present

## 2017-06-07 DIAGNOSIS — S81811D Laceration without foreign body, right lower leg, subsequent encounter: Secondary | ICD-10-CM | POA: Diagnosis not present

## 2017-06-07 DIAGNOSIS — I87311 Chronic venous hypertension (idiopathic) with ulcer of right lower extremity: Secondary | ICD-10-CM | POA: Diagnosis not present

## 2017-06-07 DIAGNOSIS — S81811A Laceration without foreign body, right lower leg, initial encounter: Secondary | ICD-10-CM | POA: Diagnosis not present

## 2017-06-09 NOTE — Progress Notes (Signed)
BENTLEE, BENNINGFIELD ([id number]) Visit Report for 06/07/2017 HPI Details Patient Name: [name], [name]. Date of Service: 06/07/2017 3:15 PM Medical Record Number: [id number] Patient Account Number: [id number] Date of Birth/Sex: 1934/10/24 (82 y.o. Male) Treating RN: Montey Hora Primary Care Provider: [name] Other Clinician: Referring Provider: [name] Treating Provider/Extender: Tito Dine in Treatment: 3 History of Present Illness HPI Description: 05/17/17; fairly healthy 82 year old who struck his right lower leg while getting off a tractor at the beginning of September. When he presented initially after 10 days to his primary physician on 04/13/17 he was felt to have concomitant cellulitis. He also had wounds on his arm and his upper right leg that healed however he has been left with a nonhealing wound on the right mid lower leg just along the tibia. He has had several courses of antibiotics for concomitant cellulitis including Keflex, cefuroxime and most recently doxycycline which she is just finishing. Currently using topical antibiotics. The patient is not a diabetic. He does have a history of vein stripping in 2012 on the right side. He is on prednisone 10 mg every other day for unclear reasons as far as the patient knows this is for "pain". ABIs in our clinic were 1.46 on the right and 1.47 on the left noncompressible 05/24/17; area on the right lower leg is smaller with a continued healthy base. He shows as 2 small areas on the right bicep area and right deltoid area which are apparently traumas this week these look very superficial 05/21/17; right lower leg traumatic wound had a thick adherent nonviable surface which was removed. The biceps and deltoid area from last week are healed however he has a new laceration over the right biceps area. He tells me he traumatizes sonic gait and pulled the skin back over 06/07/17; right lower leg traumatic wound is  fully epithelialized and closed. He has areas on his right forearm which is also closed. Electronic Signature(s) Signed: 06/08/2017 8:07:20 AM By: Linton Ham MD Entered By: Linton Ham on 06/07/2017 16:43:05 [name], [name] ([id number]) -------------------------------------------------------------------------------- Physical Exam Details Patient Name: [name]. Date of Service: 06/07/2017 3:15 PM Medical Record Number: [id number] Patient Account Number: [id number] Date of Birth/Sex: 04-May-1935 (82 y.o. Male) Treating RN: Montey Hora Primary Care Provider: [name] Other Clinician: Referring Provider: [name] Treating Provider/Extender: Tito Dine in Treatment: 3 Constitutional Patient is hypertensive.. Pulse regular and within target range for patient.Marland Kitchen Respirations regular, non-labored and within target range.. Temperature is normal and within the target range for the patient.Marland Kitchen appears in no distress. Integumentary (Hair, Skin) he shows me some erythema on his right deltoid area. He has obvious solar skin damage. I told him that this will need to be monitored. In fact he may need surveillance dermatology reviews. Notes wound exam; linear wound on the right anterior leg is now closed. Fully epithelialized. oLinear skin tear on the right biceps is also closed oTraumatic area on the right forearm also closed Electronic Signature(s) Signed: 06/08/2017 8:07:20 AM By: Linton Ham MD Entered By: Linton Ham on 06/07/2017 16:44:37 [name], [name] ([id number]) -------------------------------------------------------------------------------- Physician Orders Details Patient Name: [name]. Date of Service: 06/07/2017 3:15 PM Medical Record Number: [id number] Patient Account Number: [id number] Date of Birth/Sex: 05/29/1935 (82 y.o. Male) Treating RN: Montey Hora Primary Care Provider: [name] Other Clinician: Referring  Provider: [name] Treating Provider/Extender: Tito Dine in Treatment: 3 Verbal / Phone Orders: No Diagnosis Coding Discharge From [facility] Services o  Discharge from [place] Signature(s) Signed: 06/07/2017 4:46:00 PM By: Montey Hora Signed: 06/08/2017 8:07:20 AM By: Linton Ham MD Entered By: Montey Hora on 06/07/2017 15:59:42 [name], [name] ([id number]) -------------------------------------------------------------------------------- Problem List Details Patient Name: [name]. Date of Service: 06/07/2017 3:15 PM Medical Record Number: [id number] Patient Account Number: [id number] Date of Birth/Sex: [birth year] (82 y.o. Male) Treating RN: Montey Hora Primary Care Provider: [name] Other Clinician: Referring Provider: [name] Treating Provider/Extender: Tito Dine in Treatment: 3 Active Problems ICD-10 Encounter Code Description Active Date Diagnosis S81.811D Laceration without foreign body, right lower leg, subsequent 05/17/2017 Yes encounter L97.811 Non-pressure chronic ulcer of other part of right lower leg limited to 05/17/2017 Yes breakdown of skin I87.311 Chronic venous hypertension (idiopathic) with ulcer of right lower 05/17/2017 Yes extremity Inactive Problems Resolved Problems Electronic Signature(s) Signed: 06/08/2017 8:07:20 AM By: Linton Ham MD Entered By: Linton Ham on 06/07/2017 16:42:05 [name], [name] ([id number]) -------------------------------------------------------------------------------- Progress Note Details Patient Name: [name]. Date of Service: 06/07/2017 3:15 PM Medical Record Number: [id number] Patient Account Number: [id number] Date of Birth/Sex: November 05, 1934 (82 y.o. Male) Treating RN: Montey Hora Primary Care Provider: [name] Other Clinician: Referring Provider: [name] Treating Provider/Extender: Tito Dine in  Treatment: 3 Subjective History of Present Illness (HPI) 05/17/17; fairly healthy 82 year old who struck his right lower leg while getting off a tractor at the beginning of September. When he presented initially after 10 days to his primary physician on 04/13/17 he was felt to have concomitant cellulitis. He also had wounds on his arm and his upper right leg that healed however he has been left with a nonhealing wound on the right mid lower leg just along the tibia. He has had several courses of antibiotics for concomitant cellulitis including Keflex, cefuroxime and most recently doxycycline which she is just finishing. Currently using topical antibiotics. The patient is not a diabetic. He does have a history of vein stripping in 2012 on the right side. He is on prednisone 10 mg every other day for unclear reasons as far as the patient knows this is for "pain". ABIs in our clinic were 1.46 on the right and 1.47 on the left noncompressible 05/24/17; area on the right lower leg is smaller with a continued healthy base. He shows as 2 small areas on the right bicep area and right deltoid area which are apparently traumas this week these look very superficial 05/21/17; right lower leg traumatic wound had a thick adherent nonviable surface which was removed. The biceps and deltoid area from last week are healed however he has a new laceration over the right biceps area. He tells me he traumatizes sonic gait and pulled the skin back over 06/07/17; right lower leg traumatic wound is fully epithelialized and closed. He has areas on his right forearm which is also closed. Objective Constitutional Patient is hypertensive.. Pulse regular and within target range for patient.Marland Kitchen Respirations regular, non-labored and within target range.. Temperature is normal and within the target range for the patient.Marland Kitchen appears in no distress. Vitals Time Taken: 3:29 PM, Height: 72 in, Weight: 187 lbs, BMI: 25.4, Temperature:  98.1 F, Pulse: 54 bpm, Respiratory Rate: 18 breaths/min, Blood Pressure: 148/71 mmHg. General Notes: wound exam; linear wound on the right anterior leg is now closed. Fully epithelialized. Linear skin tear on the right biceps is also closed Traumatic area on the right forearm also closed Integumentary (Hair, Skin) he shows me some erythema on his right deltoid  area. He has obvious solar skin damage. I told him that this will need to be monitored. In fact he may need surveillance dermatology reviews. Wound #1 status is Healed - Epithelialized. Original cause of wound was Trauma. The wound is located on the [place], William V. ([id number]) Lower Leg. The wound measures 0cm length x 0cm width x 0cm depth; 0cm^2 area and 0cm^3 volume. Assessment Active Problems ICD-10 S81.811D - Laceration without foreign body, right lower leg, subsequent encounter L97.811 - Non-pressure chronic ulcer of other part of right lower leg limited to breakdown of skin I87.311 - Chronic venous hypertension (idiopathic) with ulcer of right lower extremity Plan Discharge From [facility] Services: Discharge from [place] #1 the patient can be discharged from the wound center #2 traumatic area on the right anterior leg I've advised him to keep this covered with a thick Band-Aid also the area on his right forearm #3 he obviously has had long term sun exposure from working outside. I've told him that it's quite possible he'll need to see a dermatologist for general routine skin exam. Specific to the area on his right shoulder this was erythematous and scaly nevertheless I thought he would need to monitor this carefully perhaps with the assistance of an experience dermatologist Electronic Signature(s) Signed: 06/08/2017 8:07:20 AM By: Linton Ham MD Entered By: Linton Ham on 06/07/2017 16:45:56 [place], [name]  ([id number]) -------------------------------------------------------------------------------- [place] Details Patient Name: [name] Date of Service: 06/07/2017 Medical Record Number: [id number] Patient Account Number: [id number] Date of Birth/Sex: 06-01-1935 (82 y.o. Male) Treating RN: Montey Hora Primary Care Provider: [name] Other Clinician: Referring Provider: [name] Treating Provider/Extender: Tito Dine in Treatment: 3 Diagnosis Coding ICD-10 Codes Code Description [phone] Laceration without foreign body, right lower leg, subsequent encounter L97.811 Non-pressure chronic ulcer of other part of right lower leg limited to breakdown of skin I87.311 Chronic venous hypertension (idiopathic) with ulcer of right lower extremity Facility Procedures CPT4 Code: [id number] Description: [phone] - WOUND CARE VISIT-LEV 2 EST PT Modifier: Quantity: 1 Physician Procedures CPT4 Code Description: [id number] - WC PHYS LEVEL 2 - EST PT ICD-10 Diagnosis Description S81.811D Laceration without foreign body, right lower leg, subsequent enc L97.811 Non-pressure chronic ulcer of other part of right lower leg limi Modifier: ounter ted to breakdown Quantity: 1 of skin Electronic Signature(s) Signed: 06/08/2017 8:07:20 AM By: Linton Ham MD Previous Signature: 06/07/2017 4:32:15 PM Version By: Montey Hora Entered By: Linton Ham on 06/07/2017 16:46:17

## 2017-06-09 NOTE — Progress Notes (Signed)
William, Dunn ([id number]) Visit Report for 06/07/2017 Arrival Information Details Patient Name: [name], [name]. Date of Service: 06/07/2017 3:15 PM Medical Record Number: [id number] Patient Account Number: [id number] Date of Birth/Sex: [birth year] (82 y.o. Male) Treating RN: Montey Hora Primary Care Ciria Bernardini: [name] Other Clinician: Referring Damascus Feldpausch: [name] Treating Sanjeev Main/Extender: Tito Dine in Treatment: 3 Visit Information History Since Last Visit Added or deleted any medications: No Patient Arrived: Ambulatory Any new allergies or adverse reactions: No Arrival Time: 15:27 Had a fall or experienced change in No Accompanied By: self activities of daily living that may affect Transfer Assistance: None risk of falls: Patient Identification Verified: Yes Signs or symptoms of abuse/neglect since last visito No Secondary Verification Process Completed: Yes Hospitalized since last visit: No Has Dressing in Place as Prescribed: Yes Pain Present Now: No Electronic Signature(s) Signed: 06/07/2017 4:46:00 PM By: Montey Hora Entered By: Montey Hora on 06/07/2017 15:29:02 Dalhart, Leafy Dunn ([id number]) -------------------------------------------------------------------------------- Clinic Level of Care Assessment Details Patient Name: [name]. Date of Service: 06/07/2017 3:15 PM Medical Record Number: [id number] Patient Account Number: [id number] Date of Birth/Sex: 1934-10-29 (82 y.o. Male) Treating RN: Montey Hora Primary Care Shalah Estelle: [name] Other Clinician: Referring Karlie Aung: [name] Treating Aileena Iglesia/Extender: Tito Dine in Treatment: 3 Clinic Level of Care Assessment Items TOOL 4 Quantity Score []  - Use when only an EandM is performed on FOLLOW-UP visit 0 ASSESSMENTS - Nursing Assessment / Reassessment X - Reassessment of Co-morbidities (includes updates in patient status) 1 10 X- 1  5 Reassessment of Adherence to Treatment Plan ASSESSMENTS - Wound and Skin Assessment / Reassessment X - Simple Wound Assessment / Reassessment - one wound 1 5 []  - 0 Complex Wound Assessment / Reassessment - multiple wounds []  - 0 Dermatologic / Skin Assessment (not related to wound area) ASSESSMENTS - Focused Assessment []  - Circumferential Edema Measurements - multi extremities 0 []  - 0 Nutritional Assessment / Counseling / Intervention X- 1 5 Lower Extremity Assessment (monofilament, tuning fork, pulses) []  - 0 Peripheral Arterial Disease Assessment (using hand held doppler) ASSESSMENTS - Ostomy and/or Continence Assessment and Care []  - Incontinence Assessment and Management 0 []  - 0 Ostomy Care Assessment and Management (repouching, etc.) PROCESS - Coordination of Care X - Simple Patient / Family Education for ongoing care 1 15 []  - 0 Complex (extensive) Patient / Family Education for ongoing care []  - 0 Staff obtains Programmer, systems, Records, Test Results / Process Orders []  - 0 Staff telephones HHA, Nursing Homes / Clarify orders / etc []  - 0 Routine Transfer to another Facility (non-emergent condition) []  - 0 Routine Hospital Admission (non-emergent condition) []  - 0 New Admissions / Biomedical engineer / Ordering NPWT, Apligraf, etc. []  - 0 Emergency Hospital Admission (emergent condition) X- 1 10 Simple Discharge Coordination Lybarger, Dara V. ([id number]) []  - 0 Complex (extensive) Discharge Coordination PROCESS - Special Needs []  - Pediatric / Minor Patient Management 0 []  - 0 Isolation Patient Management []  - 0 Hearing / Language / Visual special needs []  - 0 Assessment of Community assistance (transportation, D/C planning, etc.) []  - 0 Additional assistance / Altered mentation []  - 0 Support Surface(s) Assessment (bed, cushion, seat, etc.) INTERVENTIONS - Wound Cleansing / Measurement X - Simple Wound Cleansing - one wound 1 5 []  - 0 Complex Wound  Cleansing - multiple wounds X- 1 5 Wound Imaging (photographs - any number of wounds) []  - 0 Wound Tracing (instead of photographs) X- 1 5 Simple Wound  Measurement - one wound []  - 0 Complex Wound Measurement - multiple wounds INTERVENTIONS - Wound Dressings []  - Small Wound Dressing one or multiple wounds 0 []  - 0 Medium Wound Dressing one or multiple wounds []  - 0 Large Wound Dressing one or multiple wounds []  - 0 Application of Medications - topical []  - 0 Application of Medications - injection INTERVENTIONS - Miscellaneous []  - External ear exam 0 []  - 0 Specimen Collection (cultures, biopsies, blood, body fluids, etc.) []  - 0 Specimen(s) / Culture(s) sent or taken to Lab for analysis []  - 0 Patient Transfer (multiple staff / Civil Service fast streamer / Similar devices) []  - 0 Simple Staple / Suture removal (25 or less) []  - 0 Complex Staple / Suture removal (26 or more) []  - 0 Hypo / Hyperglycemic Management (close monitor of Blood Glucose) []  - 0 Ankle / Brachial Index (ABI) - do not check if billed separately X- 1 5 Vital Signs Doutt, Bobbyjoe V. ([id number]) Has the patient been seen at the hospital within the last three years: Yes Total Score: 70 Level Of Care: New/Established - Level 2 Electronic Signature(s) Signed: 06/07/2017 4:46:00 PM By: Montey Hora Entered By: Montey Hora on 06/07/2017 16:32:07 William, Leafy Dunn ([id number]) -------------------------------------------------------------------------------- Encounter Discharge Information Details Patient Name: [name]. Date of Service: 06/07/2017 3:15 PM Medical Record Number: [id number] Patient Account Number: [id number] Date of Birth/Sex: 10-20-1934 (82 y.o. Male) Treating RN: Montey Hora Primary Care Nacole Fluhr: [name] Other Clinician: Referring Seward Coran: [name] Treating Chalsey Leeth/Extender: Tito Dine in Treatment: 3 Encounter Discharge Information Items Discharge Pain  Level: 0 Discharge Condition: Stable Ambulatory Status: Ambulatory Discharge Destination: Home Transportation: Private Auto Accompanied By: self Schedule Follow-up Appointment: No Medication Reconciliation completed and No provided to Patient/Care Sayan Aldava: Provided on Clinical Summary of Care: 06/07/2017 Form Type Recipient Paper Patient CM Electronic Signature(s) Signed: 06/07/2017 4:32:33 PM By: Montey Hora Entered By: Montey Hora on 06/07/2017 16:32:32 Bordon, Leafy Dunn ([id number]) -------------------------------------------------------------------------------- Lower Extremity Assessment Details Patient Name: [name]. Date of Service: 06/07/2017 3:15 PM Medical Record Number: [id number] Patient Account Number: [id number] Date of Birth/Sex: [birth year] (82 y.o. Male) Treating RN: Montey Hora Primary Care Scottlyn Mchaney: [name] Other Clinician: Referring Drucella Karbowski: [name] Treating Curtiss Mahmood/Extender: Tito Dine in Treatment: 3 Vascular Assessment Pulses: Dorsalis Pedis Palpable: [Right:Yes] Posterior Tibial Extremity colors, hair growth, and conditions: Extremity Color: [Right:Normal] Hair Growth on Extremity: [Right:Yes] Temperature of Extremity: [Right:Warm] Capillary Refill: [Right:< 3 seconds] Electronic Signature(s) Signed: 06/07/2017 4:31:27 PM By: Montey Hora Entered By: Montey Hora on 06/07/2017 16:31:27 Fedrick, Leafy Dunn ([id number]) -------------------------------------------------------------------------------- Multi Wound Chart Details Patient Name: [name]. Date of Service: 06/07/2017 3:15 PM Medical Record Number: [id number] Patient Account Number: [id number] Date of Birth/Sex: 1934/10/12 (82 y.o. Male) Treating RN: Montey Hora Primary Care Iliani Vejar: [name] Other Clinician: Referring Ceci Taliaferro: [name] Treating Clide Remmers/Extender: Tito Dine in Treatment: 3 Vital  Signs Height(in): 72 Pulse(bpm): 54 Weight(lbs): 187 Blood Pressure(mmHg): 148/71 Body Mass Index(BMI): 25 Temperature(F): 98.1 Respiratory Rate 18 (breaths/min): Photos: [N/A:N/A] Wound Location: Right, Anterior Lower Leg N/A N/A Wounding Event: Trauma N/A N/A Primary Etiology: Trauma, Other N/A N/A Date Acquired: 03/29/2017 N/A N/A Weeks of Treatment: 3 N/A N/A Wound Status: Healed - Epithelialized N/A N/A Measurements L x W x D 0x0x0 N/A N/A (cm) Area (cm) : 0 N/A N/A Volume (cm) : 0 N/A N/A % Reduction in Area: 100.00% N/A N/A % Reduction in Volume: 100.00% N/A N/A Classification: Full Thickness With Exposed  N/A N/A Support Structures Periwound Skin Texture: No Abnormalities Noted N/A N/A Periwound Skin Moisture: No Abnormalities Noted N/A N/A Periwound Skin Color: No Abnormalities Noted N/A N/A Tenderness on Palpation: No N/A N/A Treatment Notes Electronic Signature(s) Signed: 06/08/2017 8:07:20 AM By: Linton Ham MD Entered By: Linton Ham on 06/07/2017 16:42:19 Lubas, Leafy Dunn ([id number]) -------------------------------------------------------------------------------- Multi-Disciplinary Care Plan Details Patient Name: [name]. Date of Service: 06/07/2017 3:15 PM Medical Record Number: [id number] Patient Account Number: [id number] Date of Birth/Sex: 28-Dec-1934 (82 y.o. Male) Treating RN: Montey Hora Primary Care Eran Windish: [name] Other Clinician: Referring Griselda Tosh: [name] Treating Joanette Silveria/Extender: Tito Dine in Treatment: 3 Active Inactive Electronic Signature(s) Signed: 06/07/2017 4:31:38 PM By: Montey Hora Entered By: Montey Hora on 06/07/2017 16:31:38 [place], Leafy Dunn ([id number]) -------------------------------------------------------------------------------- Pain Assessment Details Patient Name: [name]. Date of Service: 06/07/2017 3:15 PM Medical Record Number: [id number] Patient Account  Number: [id number] Date of Birth/Sex: 04/03/1935 (82 y.o. Male) Treating RN: Montey Hora Primary Care Quinlan Vollmer: [name] Other Clinician: Referring Keyauna Graefe: [name] Treating Tulani Kidney/Extender: Tito Dine in Treatment: 3 Active Problems Location of Pain Severity and Description of Pain Patient Has Paino No Site Locations Pain Management and Medication Current Pain Management: Electronic Signature(s) Signed: 06/07/2017 4:46:00 PM By: Montey Hora Entered By: Montey Hora on 06/07/2017 15:29:12 Parsell, Leafy Dunn ([id number]) -------------------------------------------------------------------------------- Patient/Caregiver Education Details Patient Name: [name]. Date of Service: 06/07/2017 3:15 PM Medical Record Number: [id number] Patient Account Number: [id number] Date of Birth/Gender: 15-Apr-1935 (82 y.o. Male) Treating RN: Montey Hora Primary Care Physician: [name] Other Clinician: Referring Physician: [name] Treating Physician/Extender: Tito Dine in Treatment: 3 Education Assessment Education Provided To: Patient Education Topics Provided Basic Hygiene: Handouts: Other: care of newly healed ulcer site Methods: Explain/Verbal Responses: State content correctly Electronic Signature(s) Signed: 06/07/2017 4:46:00 PM By: Montey Hora Entered By: Montey Hora on 06/07/2017 16:32:54 Moskowitz, Leafy Dunn ([id number]) -------------------------------------------------------------------------------- Wound Assessment Details Patient Name: [name]. Date of Service: 06/07/2017 3:15 PM Medical Record Number: [id number] Patient Account Number: [id number] Date of Birth/Sex: 1935/05/13 (82 y.o. Male) Treating RN: Montey Hora Primary Care Zachari Alberta: [name] Other Clinician: Referring Jaydee Ingman: [name] Treating Yariel Ferraris/Extender: Tito Dine in Treatment: 3 Wound Status Wound  Number: 1 Primary Etiology: Trauma, Other Wound Location: Right, Anterior Lower Leg Wound Status: Healed - Epithelialized Wounding Event: Trauma Date Acquired: 03/29/2017 Weeks Of Treatment: 3 Clustered Wound: No Photos Photo Uploaded By: Montey Hora on 06/07/2017 16:37:15 Wound Measurements Length: (cm) 0 Width: (cm) 0 Depth: (cm) 0 Area: (cm) 0 Volume: (cm) 0 % Reduction in Area: 100% % Reduction in Volume: 100% Wound Description Full Thickness With Exposed Support Classification: Structures Periwound Skin Texture Texture Color No Abnormalities Noted: No No Abnormalities Noted: No Moisture No Abnormalities Noted: No Electronic Signature(s) Signed: 06/07/2017 4:46:00 PM By: Montey Hora Entered By: Montey Hora on 06/07/2017 15:59:22 [place], Leafy Dunn ([id number]) -------------------------------------------------------------------------------- Vitals Details Patient Name: [name]. Date of Service: 06/07/2017 3:15 PM Medical Record Number: [id number] Patient Account Number: [id number] Date of Birth/Sex: 03/18/1935 (82 y.o. Male) Treating RN: Montey Hora Primary Care Tremell Reimers: [name] Other Clinician: Referring Casper Pagliuca: [name] Treating Faylene Allerton/Extender: Tito Dine in Treatment: 3 Vital Signs Time Taken: 15:29 Temperature (F): 98.1 Height (in): 72 Pulse (bpm): 54 Weight (lbs): 187 Respiratory Rate (breaths/min): 18 Body Mass Index (BMI): 25.4 Blood Pressure (mmHg): 148/71 Reference Range: 80 - 120 mg / dl Electronic Signature(s) Signed: 06/07/2017 4:46:00 PM By:  Dorthy, Di Kindle Entered By: Montey Hora on 06/07/2017 15:30:04

## 2017-08-05 ENCOUNTER — Other Ambulatory Visit: Payer: Self-pay | Admitting: *Deleted

## 2017-08-05 MED ORDER — PREDNISONE 10 MG PO TABS: 10.0000 mg | ORAL_TABLET | ORAL | 1 refills | Status: DC

## 2017-08-05 NOTE — Telephone Encounter (Signed)
Received faxed refill request from pharmacy for Prednisone. Refill sent electronically.

## 2017-08-11 ENCOUNTER — Encounter: Payer: Self-pay | Admitting: Gastroenterology

## 2017-09-06 ENCOUNTER — Encounter: Payer: Self-pay | Admitting: Internal Medicine

## 2017-09-06 ENCOUNTER — Ambulatory Visit (INDEPENDENT_AMBULATORY_CARE_PROVIDER_SITE_OTHER): Payer: Medicare Other | Admitting: Internal Medicine

## 2017-09-06 VITALS — BP 124/86 | HR 59 | Temp 97.8°F | Ht 70.0 in | Wt 192.5 lb

## 2017-09-06 DIAGNOSIS — M353 Polymyalgia rheumatica: Secondary | ICD-10-CM

## 2017-09-06 DIAGNOSIS — D510 Vitamin B12 deficiency anemia due to intrinsic factor deficiency: Secondary | ICD-10-CM

## 2017-09-06 DIAGNOSIS — Z7189 Other specified counseling: Secondary | ICD-10-CM

## 2017-09-06 DIAGNOSIS — Z0000 Encounter for general adult medical examination without abnormal findings: Secondary | ICD-10-CM

## 2017-09-06 DIAGNOSIS — M150 Primary generalized (osteo)arthritis: Secondary | ICD-10-CM

## 2017-09-06 DIAGNOSIS — Z23 Encounter for immunization: Secondary | ICD-10-CM

## 2017-09-06 DIAGNOSIS — N138 Other obstructive and reflux uropathy: Secondary | ICD-10-CM

## 2017-09-06 DIAGNOSIS — Z0001 Encounter for general adult medical examination with abnormal findings: Secondary | ICD-10-CM

## 2017-09-06 DIAGNOSIS — N401 Enlarged prostate with lower urinary tract symptoms: Secondary | ICD-10-CM

## 2017-09-06 DIAGNOSIS — M159 Polyosteoarthritis, unspecified: Secondary | ICD-10-CM

## 2017-09-06 DIAGNOSIS — I10 Essential (primary) hypertension: Secondary | ICD-10-CM

## 2017-09-06 LAB — VITAMIN B12: VITAMIN B 12: 759 pg/mL (ref 211–911)

## 2017-09-06 LAB — CBC
HEMATOCRIT: 42.3 % (ref 39.0–52.0)
Hemoglobin: 14 g/dL (ref 13.0–17.0)
MCHC: 33.1 g/dL (ref 30.0–36.0)
MCV: 89.7 fl (ref 78.0–100.0)
Platelets: 175 10*3/uL (ref 150.0–400.0)
RBC: 4.72 Mil/uL (ref 4.22–5.81)
RDW: 14.2 % (ref 11.5–15.5)
WBC: 7.1 10*3/uL (ref 4.0–10.5)

## 2017-09-06 LAB — COMPREHENSIVE METABOLIC PANEL
ALT: 13 U/L (ref 0–53)
AST: 20 U/L (ref 0–37)
Albumin: 4.1 g/dL (ref 3.5–5.2)
Alkaline Phosphatase: 86 U/L (ref 39–117)
BILIRUBIN TOTAL: 0.6 mg/dL (ref 0.2–1.2)
BUN: 21 mg/dL (ref 6–23)
CO2: 30 meq/L (ref 19–32)
Calcium: 9.5 mg/dL (ref 8.4–10.5)
Chloride: 108 mEq/L (ref 96–112)
Creatinine, Ser: 0.77 mg/dL (ref 0.40–1.50)
GFR: 103.15 mL/min (ref >60.00)
GLUCOSE: 90 mg/dL (ref 70–99)
Potassium: 4.1 mEq/L (ref 3.5–5.1)
Sodium: 144 mEq/L (ref 135–145)
TOTAL PROTEIN: 7 g/dL (ref 6.0–8.3)

## 2017-09-06 LAB — SEDIMENTATION RATE: Sed Rate: 3 mm/hr (ref 0–20)

## 2017-09-06 MED ORDER — PREDNISONE 5 MG PO TABS: 5.0000 mg | ORAL_TABLET | ORAL | 3 refills | Status: DC

## 2017-09-06 NOTE — Assessment & Plan Note (Signed)
Seems to be in remission Will try to wean the prednisone further
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
## 2017-09-06 NOTE — Patient Instructions (Addendum)
Please try without the oxybutynin for a few days. You can restart it if you notice your urination worsens. Let me know if you want to see a urologist to consider surgery. Change the prednisone to 10mg  alternating with 5mg  every other day for the next month (take 1/2 of the 10mg  tab to make 5mg  till you finish them). After a month, if no muscle problems, reduce to 5mg  every other day.

## 2017-09-06 NOTE — Assessment & Plan Note (Signed)
BP Readings from Last 3 Encounters:  09/06/17 124/86  05/11/17 132/84  05/04/17 118/80   Good control

## 2017-09-06 NOTE — Addendum Note (Signed)
Addended by: Pilar Grammes on: 09/06/2017 09:22 AM   Modules accepted: Orders

## 2017-09-06 NOTE — Assessment & Plan Note (Signed)
See social history 

## 2017-09-06 NOTE — Assessment & Plan Note (Signed)
On oral therapy  Will check lab

## 2017-09-06 NOTE — Assessment & Plan Note (Signed)
I have personally reviewed the Medicare Annual Wellness questionnaire and have noted 1. The patient's medical and social history 2. Their use of alcohol, tobacco or illicit drugs 3. Their current medications and supplements 4. The patient's functional ability including ADL's, fall risks, home safety risks and hearing or visual             impairment. 5. Diet and physical activities 6. Evidence for depression or mood disorders  The patients weight, height, BMI and visual acuity have been recorded in the chart I have made referrals, counseling and provided education to the patient based review of the above and I have provided the pt with a written personalized care plan for preventive services.  I have provided you with a copy of your personalized plan for preventive services. Please take the time to review along with your updated medication list.  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

## 2017-09-06 NOTE — Progress Notes (Signed)
Visual Acuity Screening   Right eye Left eye Both eyes  Without correction: 20/30 20/30 20/25   With correction:     Hearing Screening Comments: Has hearing aids, not wearing today.

## 2017-09-06 NOTE — Assessment & Plan Note (Signed)
Symptoms are worse Not sure oxybutynin is helping---or perhaps he needs more?? Not ready for surgery but discussed

## 2017-09-06 NOTE — Assessment & Plan Note (Signed)
Mild symptoms No regular meds

## 2017-09-09 ENCOUNTER — Telehealth: Payer: Self-pay | Admitting: Internal Medicine

## 2017-09-09 MED ORDER — TAMSULOSIN HCL 0.4 MG PO CAPS: 0.4000 mg | ORAL_CAPSULE | Freq: Every day | ORAL | 1 refills | Status: DC

## 2017-09-09 NOTE — Telephone Encounter (Signed)
Copied from [place] [id number]. Topic: Quick Communication - Rx Refill/Question >> Sep 09, 2017  2:59 PM Sandi Mariscal E, NT wrote: Medication: tamsulosin (FLOMAX) 0.4 MG CAPS capsule   Has the patient contacted their pharmacy? yes   (Agent: If no, request that the patient contact the pharmacy for the refill.)   Preferred Pharmacy (with phone number or street name): [place], [place] [phone] (Phone) [phone] (Fax)     Agent: Please be advised that RX refills may take up to 3 business days. We ask that you follow-up with your pharmacy.

## 2017-09-16 ENCOUNTER — Telehealth: Payer: Self-pay | Admitting: Internal Medicine

## 2017-09-16 MED ORDER — FINASTERIDE 5 MG PO TABS: 5.0000 mg | ORAL_TABLET | Freq: Every day | ORAL | 2 refills | Status: DC

## 2017-09-16 NOTE — Telephone Encounter (Signed)
Copied from [place] [id number]. Topic: Quick Communication - Rx Refill/Question >> Sep 16, 2017  3:12 PM Burnis Medin, NT wrote: Medication: finasteride (PROSCAR) 5 MG tablet   Has the patient contacted their pharmacy? Yes  (Agent: If no, request that the patient contact the pharmacy for the refill.)   Preferred Pharmacy (with phone number or street name): [place], [place] [phone] (Phone) [phone] (Fax)     Agent: Please be advised that RX refills may take up to 3 business days. We ask that you follow-up with your pharmacy.

## 2018-01-19 ENCOUNTER — Other Ambulatory Visit: Payer: Self-pay | Admitting: Internal Medicine

## 2018-01-19 ENCOUNTER — Ambulatory Visit (INDEPENDENT_AMBULATORY_CARE_PROVIDER_SITE_OTHER): Payer: Medicare Other | Admitting: Internal Medicine

## 2018-01-19 ENCOUNTER — Encounter: Payer: Self-pay | Admitting: Internal Medicine

## 2018-01-19 ENCOUNTER — Ambulatory Visit (INDEPENDENT_AMBULATORY_CARE_PROVIDER_SITE_OTHER)
Admission: RE | Admit: 2018-01-19 | Discharge: 2018-01-19 | Disposition: A | Discharge status: Home / Self Care | Payer: Medicare Other | Source: Ambulatory Visit | Attending: Internal Medicine | Admitting: Internal Medicine

## 2018-01-19 VITALS — BP 126/78 | HR 58 | Temp 97.9°F | Wt 197.0 lb

## 2018-01-19 DIAGNOSIS — M25471 Effusion, right ankle: Secondary | ICD-10-CM

## 2018-01-19 DIAGNOSIS — M25571 Pain in right ankle and joints of right foot: Secondary | ICD-10-CM | POA: Diagnosis not present

## 2018-01-19 DIAGNOSIS — M7989 Other specified soft tissue disorders: Secondary | ICD-10-CM | POA: Diagnosis not present

## 2018-01-19 MED ORDER — PREDNISONE 10 MG PO TABS: ORAL_TABLET | ORAL | 0 refills | Status: DC
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
## 2018-01-19 NOTE — Telephone Encounter (Signed)
Medication resent to [facility] as requested by the pt. Prescription previously sent ot [facility] in [place], [place] on 6/201/19.

## 2018-01-19 NOTE — Telephone Encounter (Signed)
Copied from [place] [id number]. Topic: Quick Communication - Rx Refill/Question >> Jan 19, 2018  5:49 PM Robina Ade, Helene Kelp D wrote: Medication: predniSONE (DELTASONE) 5 MG tablet  Has the patient contacted their pharmacy?Yes, med was sent to the wrong pharmacy and need it to be send to the correct one which is [facility]. (Agent: If no, request that the patient contact the pharmacy for the refill.) (Agent: If yes, when and what did the pharmacy advise?)  Preferred Pharmacy (with phone number or street name): [facility] - [place]: Please be advised that RX refills may take up to 3 business days. We ask that you follow-up with your pharmacy.

## 2018-01-19 NOTE — Patient Instructions (Signed)
RICE for Routine Care of Injuries Many injuries can be cared for using rest, ice, compression, and elevation (RICE therapy). Using RICE therapy can help to lessen pain and swelling. It can help your body to heal. Rest Reduce your normal activities and avoid using the injured part of your body. You can go back to your normal activities when you feel okay and your doctor says it is okay. Ice Do not put ice on your bare skin.  Put ice in a plastic bag.  Place a towel between your skin and the bag.  Leave the ice on for 20 minutes, 2-3 times a day.  Do this for as long as told by your doctor. Compression Compression means putting pressure on the injured area. This can be done with an elastic bandage. If an elastic bandage has been applied:  Remove and reapply the bandage every 3-4 hours or as told by your doctor.  Make sure the bandage is not wrapped too tight. Wrap the bandage more loosely if part of your body beyond the bandage is blue, swollen, cold, painful, or loses feeling (numb).  See your doctor if the bandage seems to make your problems worse.  Elevation Elevation means keeping the injured area raised. Raise the injured area above your heart or the center of your chest if you can. When should I get help? You should get help if:  You keep having pain and swelling.  Your symptoms get worse.  Get help right away if: You should get help right away if:  You have sudden bad pain at or below the area of your injury.  You have redness or more swelling around your injury.  You have tingling or numbness at or below the injury that does not go away when you take off the bandage.  This information is not intended to replace advice given to you by your health care provider. Make sure you discuss any questions you have with your health care provider. Document Released: 01/05/2008 Document Revised: 06/15/2016 Document Reviewed: 06/26/2014 Elsevier Interactive Patient Education  2017  Elsevier Inc[employer]  

## 2018-01-28 IMAGING — CT CT HEAD W/O CM
5 of 7 series · 16 of 47 positions shown, 17 images · non-contrast
Comparison: CT head and cervical spine from 07/08/2004

CLINICAL DATA: Falls this week. Injury 2 days ago in which a cow
and a gate fell on top of the patient.

EXAM:
CT HEAD WITHOUT CONTRAST
CT CERVICAL SPINE WITHOUT CONTRAST
Right elbow, two views
TECHNIQUE: Multidetector CT imaging of the head and cervical spine was
performed following the standard protocol without intravenous
contrast. Multiplanar CT image reconstructions of the cervical spine
were also generated.
Right elbow radiograph dictation included in this report due to a
linking issue with the exams.

[Series 2: head wo · axial · 0.46mm/px · z∈[+109,+154]mm · 2 of 29 slices shown, 3 images]
[im 10/29  brain]
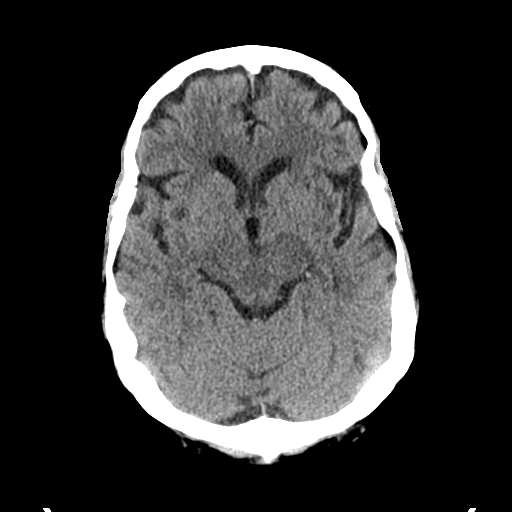
[im 10/29  bone]
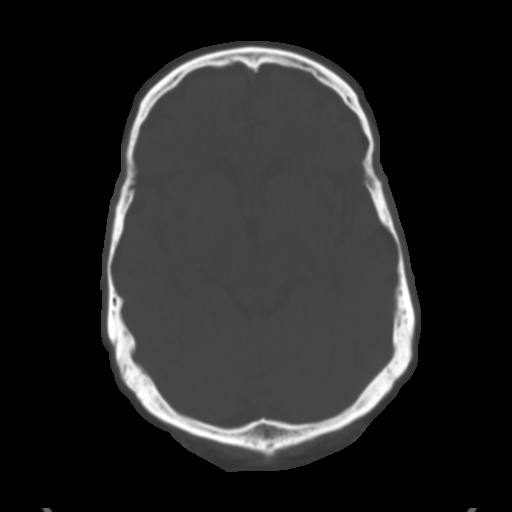
[im 19/29  brain]
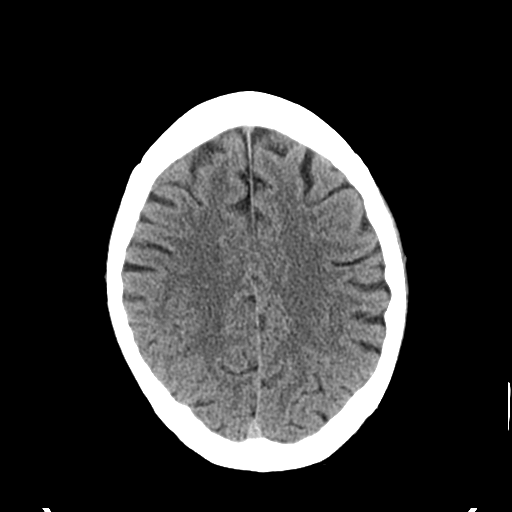

[Series 4: coronal soft tissue · coronal · 0.28mm/px · 3 of 70 slices shown]
[im 18/70  brain]
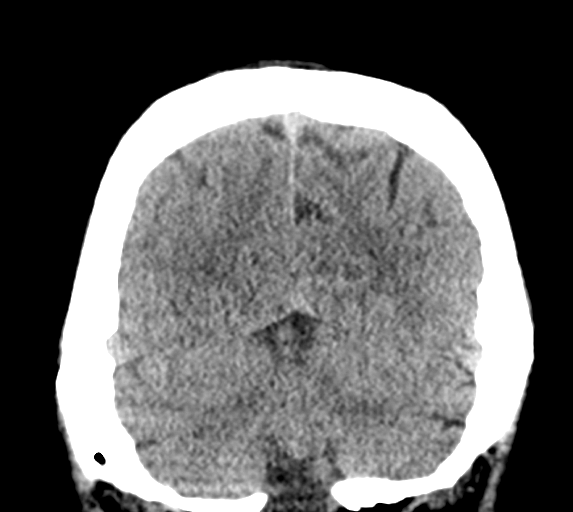
[im 35/70  brain]
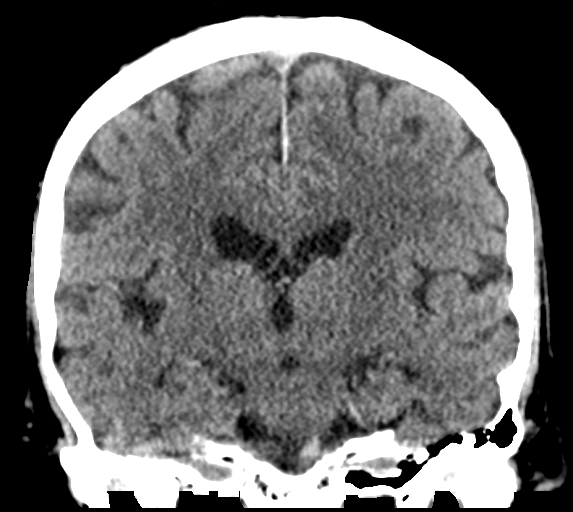
[im 52/70  brain]
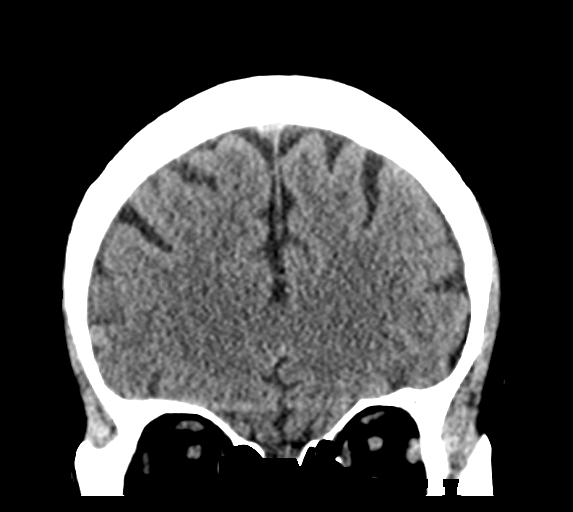

[Series 5: sagittal soft tissue · sagittal · 0.28mm/px · 1 of 52 slices shown]
[im 26/52  brain]
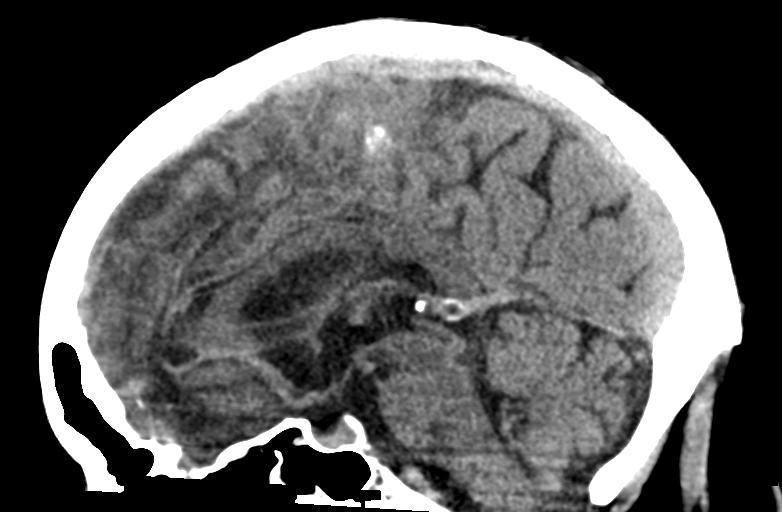

[Series 7: c spine soft · axial · 0.34mm/px · z∈[-106,-76]mm · 2 of 90 slices shown]
[im 8/90  brain]
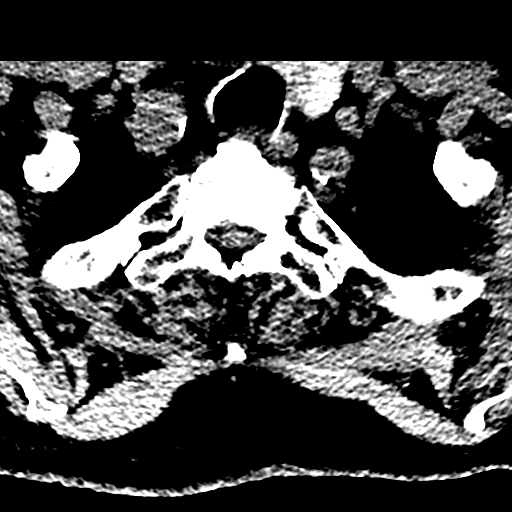
[im 23/90  brain]
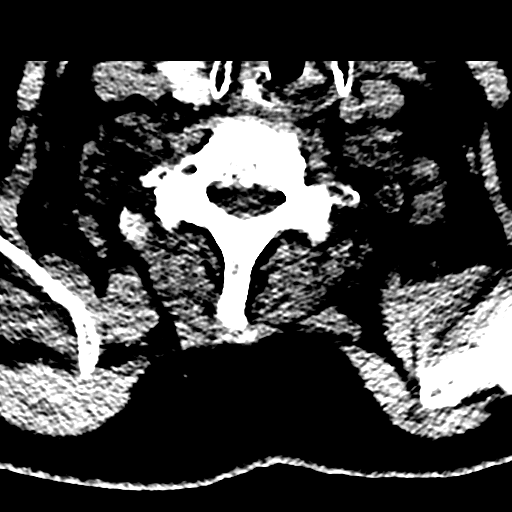

[Series 12: orthogonal bone · axial · 0.23mm/px · z∈[-116,+36]mm · 8 of 93 slices shown]
[im 8/93  bone]
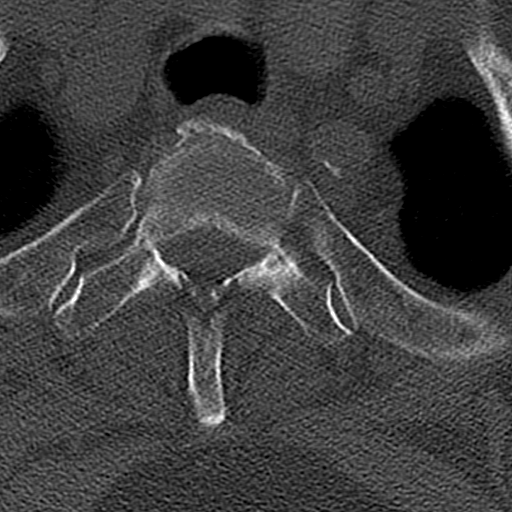
[im 24/93  bone]
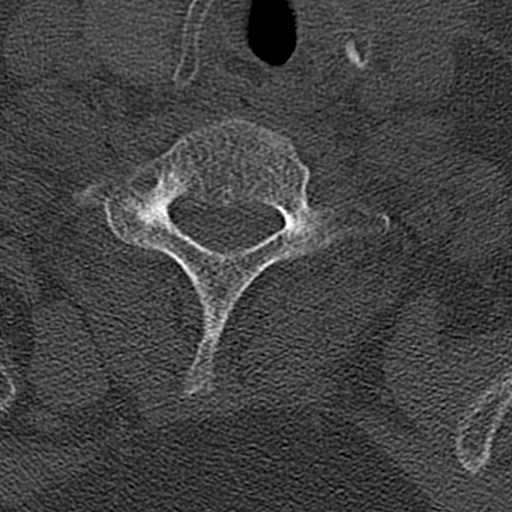
[im 31/93  bone]
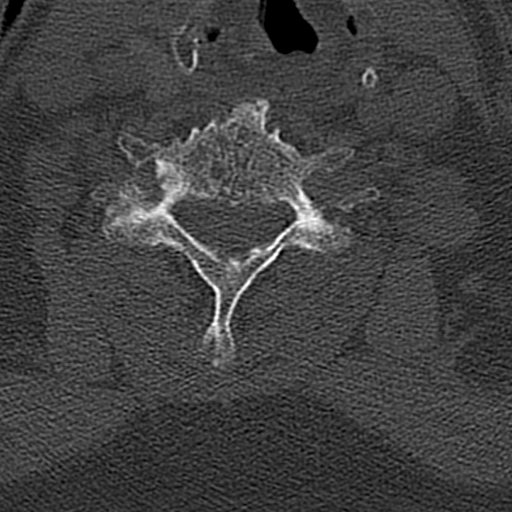
[im 39/93  bone]
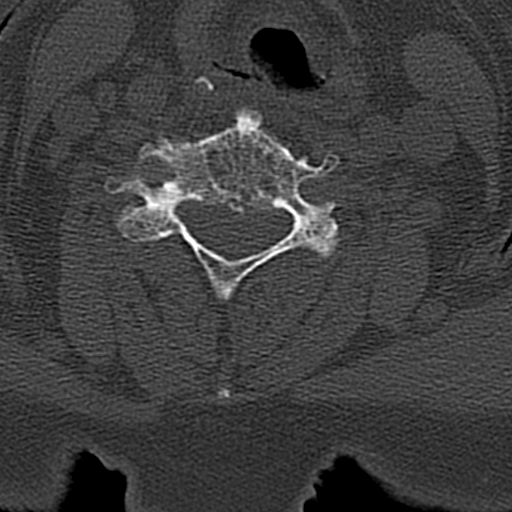
[im 54/93  bone]
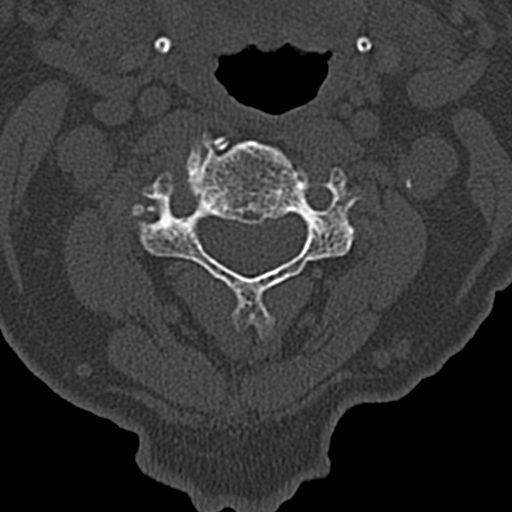
[im 62/93  bone]
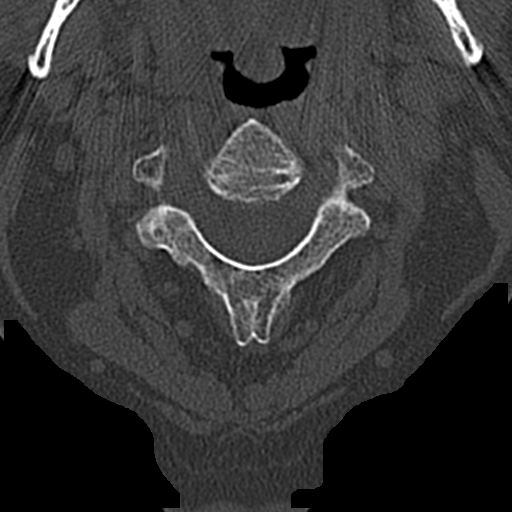
[im 70/93  bone]
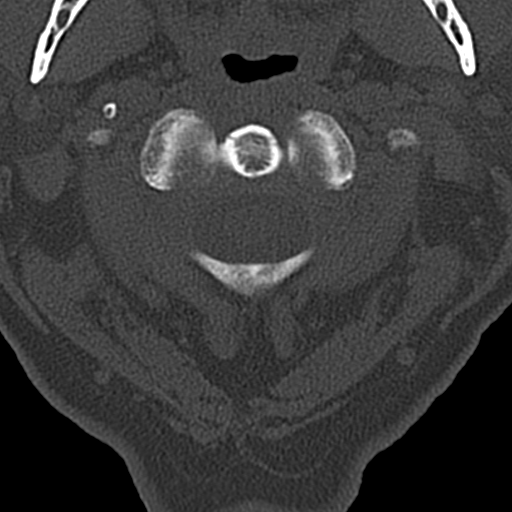
[im 85/93  bone]
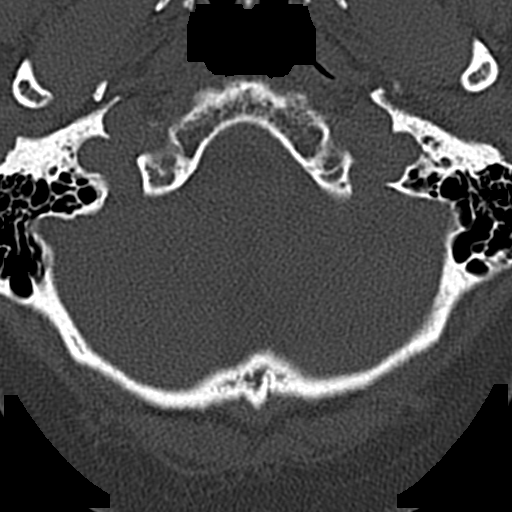

[16 of 47 positions shown; findings below may reference images not displayed]

FINDINGS: CT HEAD FINDINGS

Small remote lacunar infarcts in the external capsules are suspected
bilaterally. Periventricular white matter and corona radiata
hypodensities favor chronic ischemic microvascular white matter
disease.

Otherwise, the brainstem, cerebellum, cerebral peduncles, thalami,
basal ganglia, basilar cisterns, and ventricular system appear
within normal limits. No intracranial hemorrhage, mass lesion, or
acute CVA.

Chronic ethmoid sinusitis noted.

CT CERVICAL SPINE FINDINGS

No cervical spine fracture or acute subluxation is identified. Loss
of disc height at all levels between C3 and C7 with calcification
associated with degenerative disc disease at the C3-4 and C4-5
levels. Straightening of the normal cervical lordosis.

Uncinate and facet spurring cause osseous foraminal stenosis on the
right at C3-4, C4-5, and C5-6 ; and on the left at C5-6 and C6-7 and
to a lesser degree at C3-4 and C4-5. No prevertebral soft tissue
swelling.

RIGHT ELBOW RADIOGRAPHS, TWO VIEWS:

Plate and screw fixators of the proximal ulna noted. Ossification
noted in the expected vicinity of the common extensor tendon. There
is spurring of the coronoid process and radial head. No elbow joint
effusion is identified. Deformity in the proximal ulna related to
old healed fracture.
IMPRESSION: 1. No acute intracranial findings or acute cervical spine findings.
No acute findings involving the right elbow.
2. Small remote lacunar infarcts in the external capsules.
Periventricular white matter and corona radiata hypodensities favor
chronic ischemic microvascular white matter disease.
3. Mild chronic ethmoid sinusitis.
4. Cervical spondylosis causing multilevel foraminal impingement as
detailed above.
5. Plate and screw fixators in the proximal ulna related tibial
fracture. Degenerative findings in the elbow include spurring of the
coronoid process and radial head. There is also a chronic
ossification in the expected vicinity of the common extensor tendon.

## 2018-01-28 IMAGING — CR DG CHEST 2V
2 series · 2 of 2 positions shown · non-contrast
Comparison: 09/26/2007

CLINICAL DATA: Fall injury 2 days ago, a cow kicked a gate causing
the gate and the cow to fall top of the patient. Pain in various
locations.

EXAM:
CHEST  2 VIEW

[chest pa]
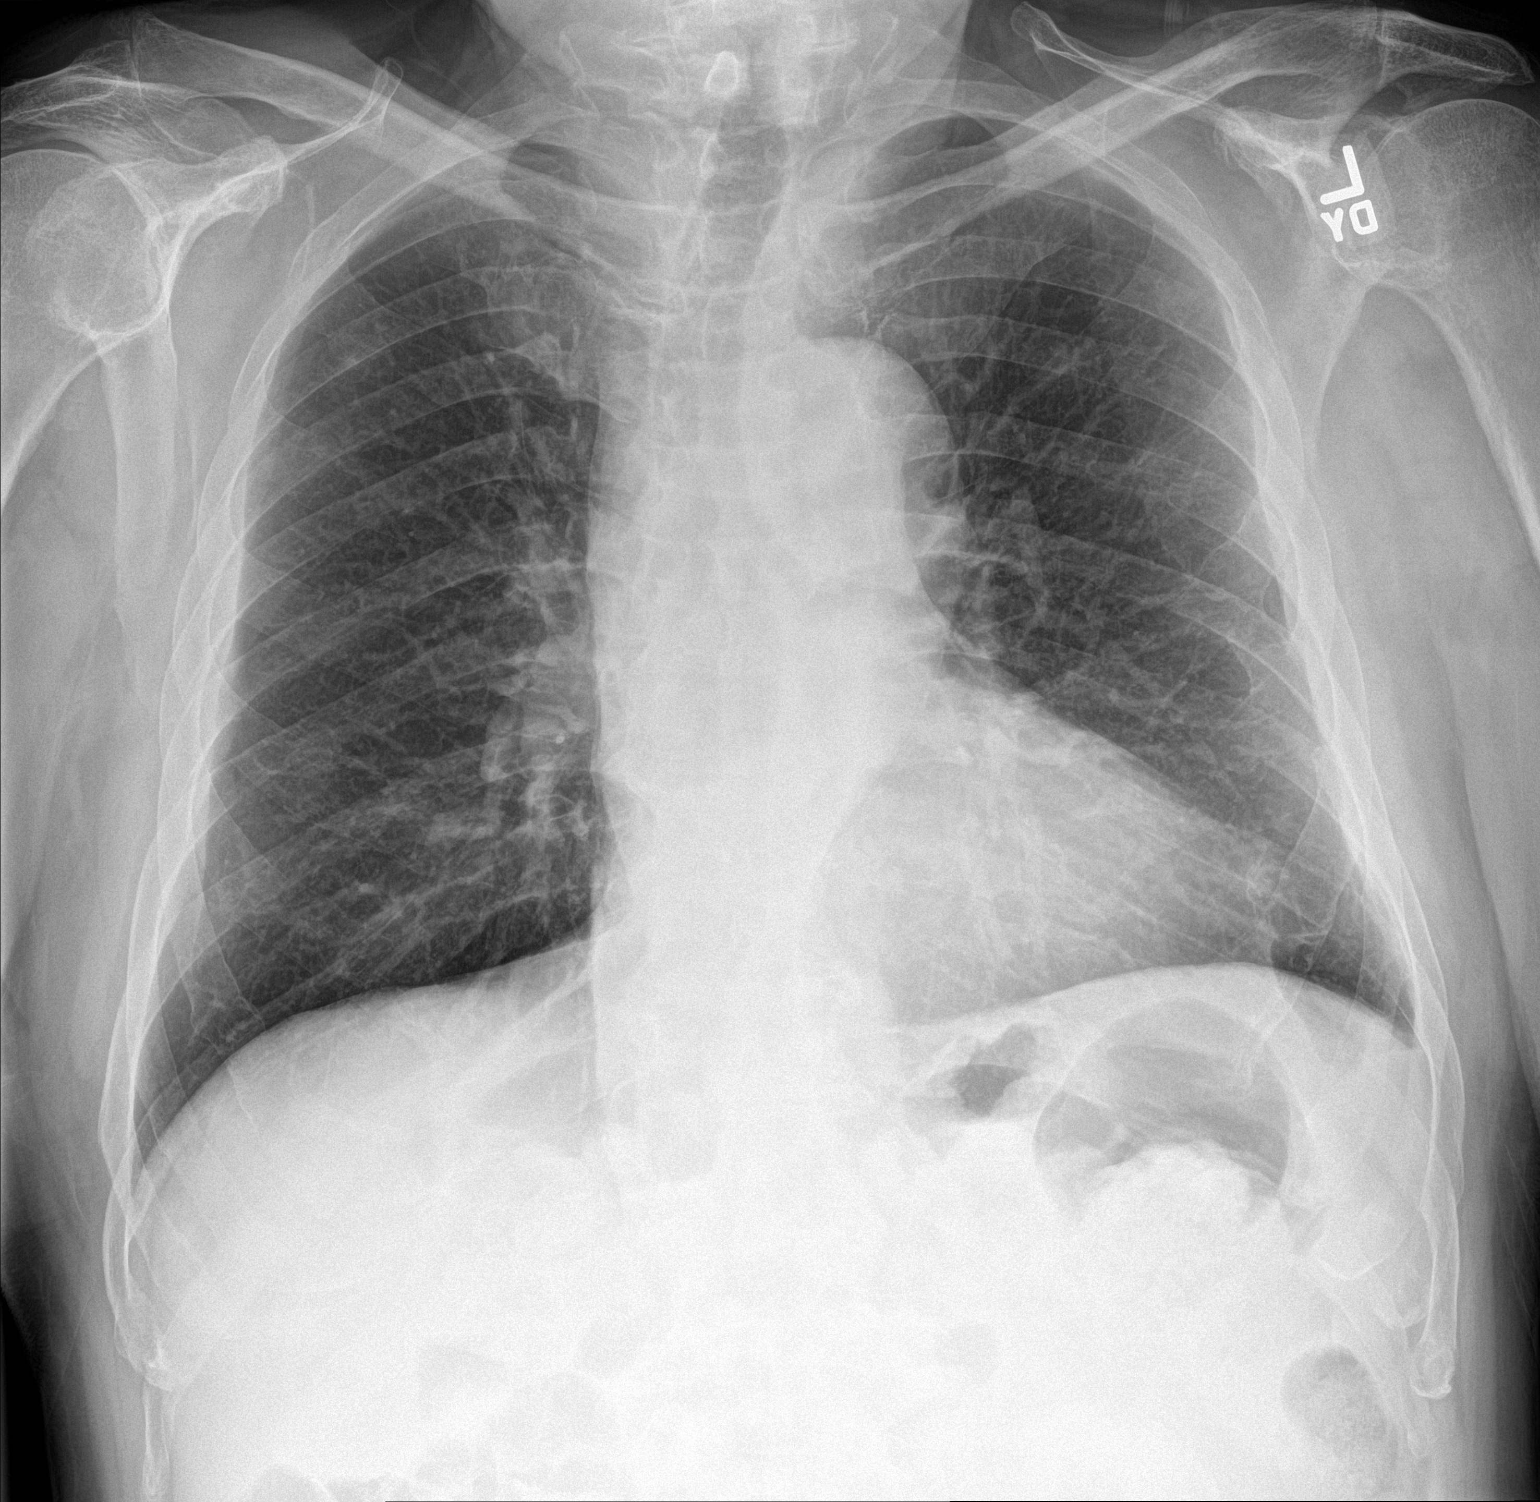

[chest lat]
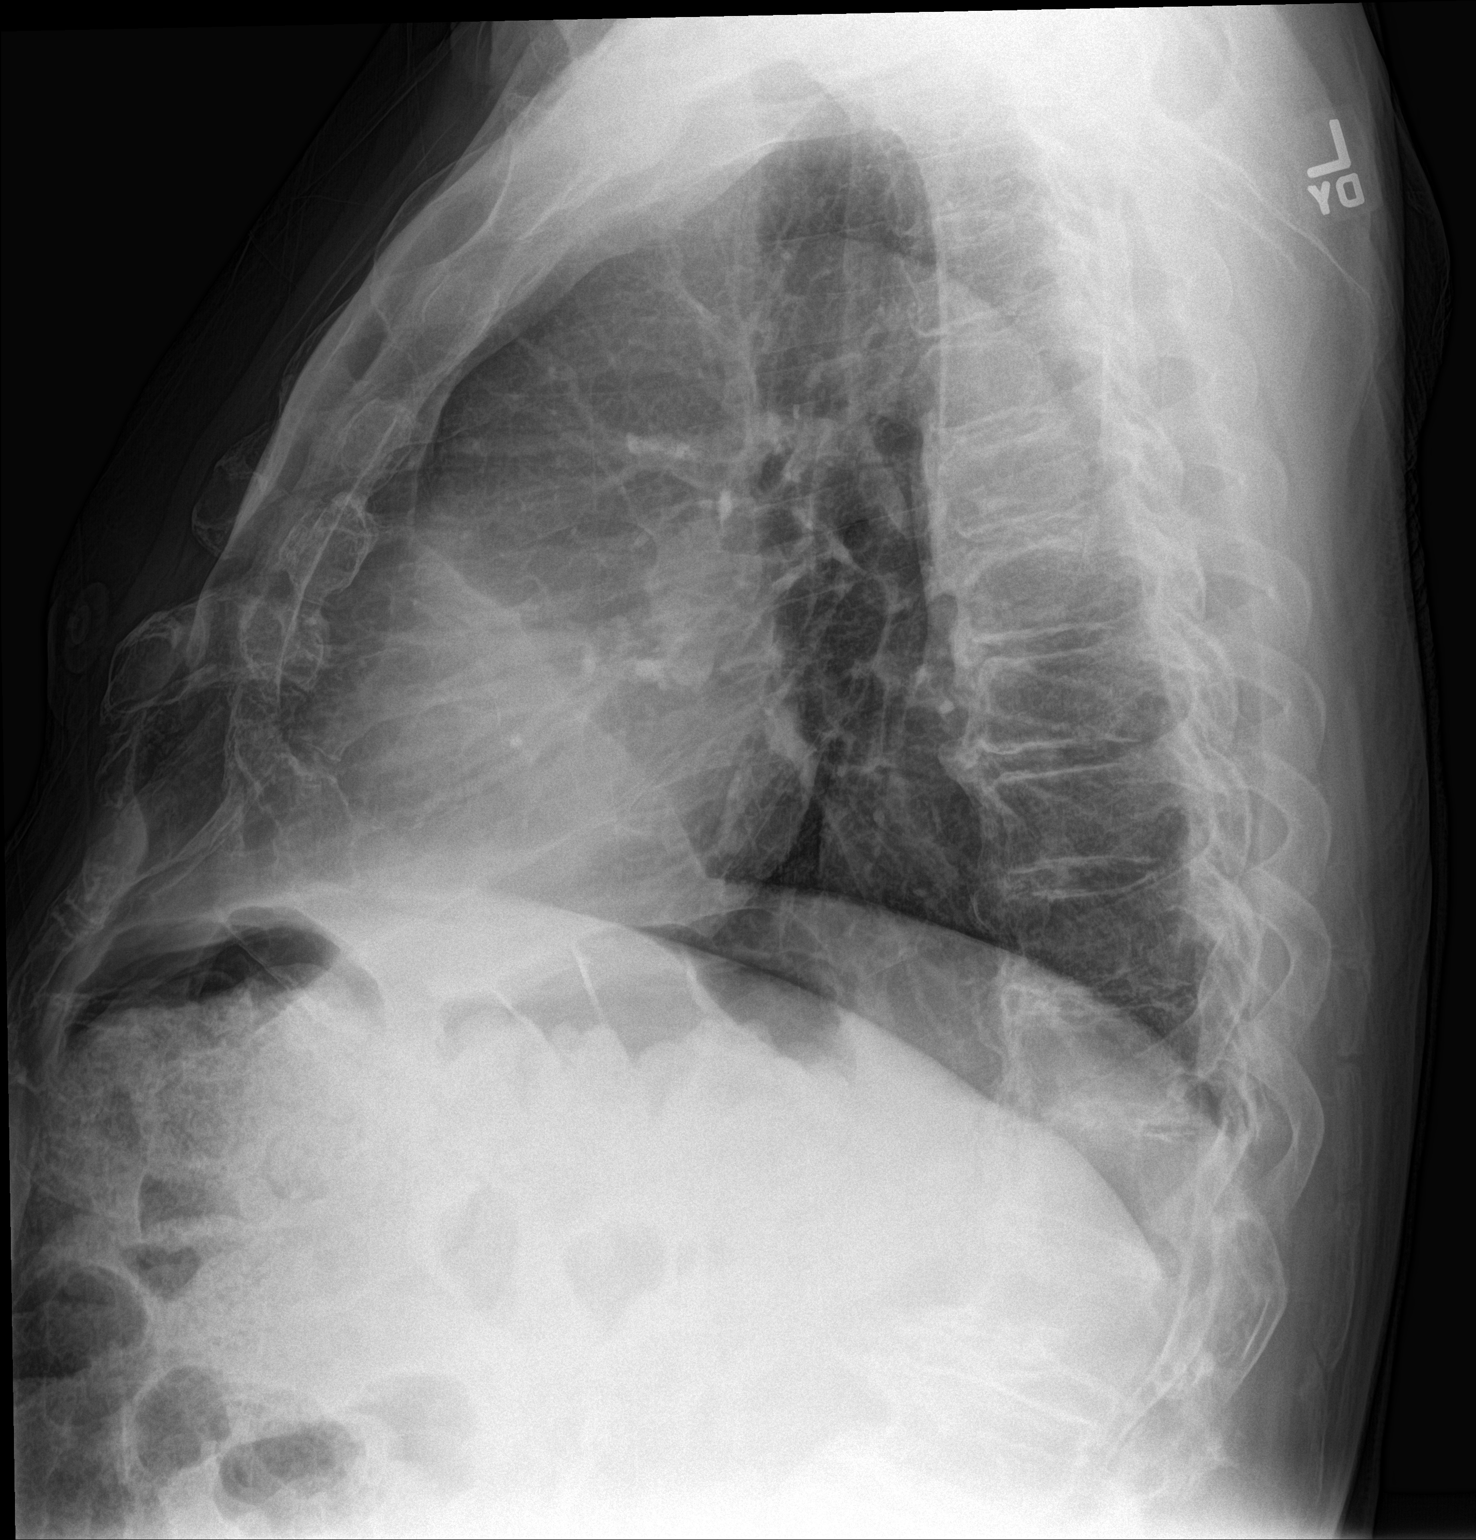

[2 of 2 positions shown; findings below may reference images not displayed]

FINDINGS: Tortuous thoracic aorta. Thoracic spondylosis. Old left rib
deformities compatible with healed fractures. Right anterior rib
irregularities also suggest healed fractures. No pneumothorax or
pleural effusion identified. No pulmonary contusion.
IMPRESSION: 1. No acute thoracic findings.
2. Old bilateral healed rib fractures.
3. Tortuous thoracic aorta.

## 2018-02-01 ENCOUNTER — Ambulatory Visit (INDEPENDENT_AMBULATORY_CARE_PROVIDER_SITE_OTHER): Payer: Medicare Other | Admitting: Family Medicine

## 2018-02-01 ENCOUNTER — Encounter: Payer: Self-pay | Admitting: Family Medicine

## 2018-02-01 VITALS — BP 142/82 | HR 99 | Temp 101.3°F | Ht 70.0 in | Wt 189.5 lb

## 2018-02-01 DIAGNOSIS — B9789 Other viral agents as the cause of diseases classified elsewhere: Secondary | ICD-10-CM

## 2018-02-01 DIAGNOSIS — J069 Acute upper respiratory infection, unspecified: Secondary | ICD-10-CM

## 2018-02-01 MED ORDER — BENZONATATE 100 MG PO CAPS: 100.0000 mg | ORAL_CAPSULE | Freq: Three times a day (TID) | ORAL | 0 refills | Status: DC | PRN

## 2018-02-01 MED ORDER — AMOXICILLIN 875 MG PO TABS: 875.0000 mg | ORAL_TABLET | Freq: Two times a day (BID) | ORAL | 0 refills | Status: DC
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
## 2018-02-01 NOTE — Patient Instructions (Addendum)
Good to see you today  I think you have a virus  Your can take tylenol for your fever and aches  I have sent a cough pill to your pharmacy  If you are not better in 3-4 days, please take antibiotic  Get extra rest and drink enough water to make your urine light yellow

## 2018-03-02 ENCOUNTER — Other Ambulatory Visit: Payer: Self-pay | Admitting: Internal Medicine

## 2018-06-24 ENCOUNTER — Other Ambulatory Visit: Payer: Self-pay | Admitting: Internal Medicine

## 2018-07-06 ENCOUNTER — Ambulatory Visit (INDEPENDENT_AMBULATORY_CARE_PROVIDER_SITE_OTHER): Payer: Medicare Other

## 2018-07-06 DIAGNOSIS — H01001 Unspecified blepharitis right upper eyelid: Secondary | ICD-10-CM | POA: Diagnosis not present

## 2018-07-06 DIAGNOSIS — H01002 Unspecified blepharitis right lower eyelid: Secondary | ICD-10-CM | POA: Diagnosis not present

## 2018-07-06 DIAGNOSIS — Z23 Encounter for immunization: Secondary | ICD-10-CM | POA: Diagnosis not present

## 2018-07-06 DIAGNOSIS — H10022 Other mucopurulent conjunctivitis, left eye: Secondary | ICD-10-CM | POA: Diagnosis not present

## 2018-07-06 DIAGNOSIS — H00025 Hordeolum internum left lower eyelid: Secondary | ICD-10-CM | POA: Diagnosis not present

## 2018-07-13 DIAGNOSIS — H01001 Unspecified blepharitis right upper eyelid: Secondary | ICD-10-CM | POA: Diagnosis not present

## 2018-07-13 DIAGNOSIS — H10022 Other mucopurulent conjunctivitis, left eye: Secondary | ICD-10-CM | POA: Diagnosis not present

## 2018-07-13 DIAGNOSIS — H00025 Hordeolum internum left lower eyelid: Secondary | ICD-10-CM | POA: Diagnosis not present

## 2018-07-13 DIAGNOSIS — H01002 Unspecified blepharitis right lower eyelid: Secondary | ICD-10-CM | POA: Diagnosis not present

## 2018-07-21 DIAGNOSIS — H00025 Hordeolum internum left lower eyelid: Secondary | ICD-10-CM | POA: Diagnosis not present

## 2018-08-09 DIAGNOSIS — H10022 Other mucopurulent conjunctivitis, left eye: Secondary | ICD-10-CM | POA: Diagnosis not present

## 2018-08-09 DIAGNOSIS — H0015 Chalazion left lower eyelid: Secondary | ICD-10-CM | POA: Diagnosis not present

## 2018-08-09 DIAGNOSIS — H00025 Hordeolum internum left lower eyelid: Secondary | ICD-10-CM | POA: Diagnosis not present

## 2018-09-12 ENCOUNTER — Encounter: Payer: Self-pay | Admitting: Internal Medicine

## 2018-09-12 ENCOUNTER — Ambulatory Visit (INDEPENDENT_AMBULATORY_CARE_PROVIDER_SITE_OTHER): Payer: Medicare Other | Admitting: Internal Medicine

## 2018-09-12 VITALS — BP 116/80 | HR 87 | Temp 97.5°F | Ht 70.0 in | Wt 189.0 lb

## 2018-09-12 DIAGNOSIS — N401 Enlarged prostate with lower urinary tract symptoms: Secondary | ICD-10-CM

## 2018-09-12 DIAGNOSIS — I10 Essential (primary) hypertension: Secondary | ICD-10-CM | POA: Diagnosis not present

## 2018-09-12 DIAGNOSIS — Z0000 Encounter for general adult medical examination without abnormal findings: Secondary | ICD-10-CM | POA: Diagnosis not present

## 2018-09-12 DIAGNOSIS — M353 Polymyalgia rheumatica: Secondary | ICD-10-CM

## 2018-09-12 DIAGNOSIS — N138 Other obstructive and reflux uropathy: Secondary | ICD-10-CM | POA: Diagnosis not present

## 2018-09-12 DIAGNOSIS — Z7189 Other specified counseling: Secondary | ICD-10-CM

## 2018-09-12 DIAGNOSIS — D510 Vitamin B12 deficiency anemia due to intrinsic factor deficiency: Secondary | ICD-10-CM

## 2018-09-12 LAB — CBC
HCT: 43.1 % (ref 39.0–52.0)
Hemoglobin: 14.4 g/dL (ref 13.0–17.0)
MCHC: 33.4 g/dL (ref 30.0–36.0)
MCV: 91.2 fl (ref 78.0–100.0)
Platelets: 179 10*3/uL (ref 150.0–400.0)
RBC: 4.73 Mil/uL (ref 4.22–5.81)
RDW: 14.3 % (ref 11.5–15.5)
WBC: 6.8 10*3/uL (ref 4.0–10.5)

## 2018-09-12 LAB — VITAMIN B12: Vitamin B-12: 891 pg/mL (ref 211–911)

## 2018-09-12 LAB — COMPREHENSIVE METABOLIC PANEL
ALT: 13 U/L (ref 0–53)
AST: 21 U/L (ref 0–37)
Albumin: 4.2 g/dL (ref 3.5–5.2)
Alkaline Phosphatase: 90 U/L (ref 39–117)
BUN: 25 mg/dL — ABNORMAL HIGH (ref 6–23)
CHLORIDE: 106 meq/L (ref 96–112)
CO2: 25 meq/L (ref 19–32)
Calcium: 9.4 mg/dL (ref 8.4–10.5)
Creatinine, Ser: 0.72 mg/dL (ref 0.40–1.50)
GFR: 104.61 mL/min (ref >60.00)
Glucose, Bld: 85 mg/dL (ref 70–99)
Potassium: 4.1 mEq/L (ref 3.5–5.1)
Sodium: 140 mEq/L (ref 135–145)
Total Bilirubin: 0.6 mg/dL (ref 0.2–1.2)
Total Protein: 6.9 g/dL (ref 6.0–8.3)

## 2018-09-12 LAB — SEDIMENTATION RATE: Sed Rate: 16 mm/hr (ref 0–20)

## 2018-09-12 MED ORDER — NORTRIPTYLINE HCL 10 MG PO CAPS: 10.0000 mg | ORAL_CAPSULE | Freq: Every day | ORAL | 11 refills | Status: DC

## 2018-09-12 NOTE — Assessment & Plan Note (Signed)
With OAB--especially at night Will stop oxybutynin--not helping Try low dose nortriptyline at night Consider urology

## 2018-09-12 NOTE — Assessment & Plan Note (Signed)
BP Readings from Last 3 Encounters:  09/12/18 116/80  02/01/18 (!) 142/82  01/19/18 126/78   Still fine No action needed

## 2018-09-12 NOTE — Assessment & Plan Note (Signed)
Still seems to be in remission on low dose prednisone Will check sed rate

## 2018-09-12 NOTE — Assessment & Plan Note (Signed)
I have personally reviewed the Medicare Annual Wellness questionnaire and have noted 1. The patient's medical and social history 2. Their use of alcohol, tobacco or illicit drugs 3. Their current medications and supplements 4. The patient's functional ability including ADL's, fall risks, home safety risks and hearing or visual             impairment. 5. Diet and physical activities 6. Evidence for depression or mood disorders  The patients weight, height, BMI and visual acuity have been recorded in the chart I have made referrals, counseling and provided education to the patient based review of the above and I have provided the pt with a written personalized care plan for preventive services.  I have provided you with a copy of your personalized plan for preventive services. Please take the time to review along with your updated medication list.  Yearly flu vaccine No cancer screening due to age Consider shingrix at pharmacist Stays active

## 2018-09-12 NOTE — Assessment & Plan Note (Signed)
Still takes OTC

## 2018-09-12 NOTE — Assessment & Plan Note (Signed)
See social history 
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
## 2018-09-12 NOTE — Patient Instructions (Signed)
Stop the oxybutynin. Try the nortriptyline at bedtime----1 capsule nightly for a week, then increase to 2 if it is not helping. We will consider an evaluation by a urologist if your symptoms aren't better---let me know.

## 2018-09-12 NOTE — Progress Notes (Signed)
Hearing Screening Comments: Has hearing aids. Not wearing them today. Vision Screening Comments: January 2020

## 2018-09-26 ENCOUNTER — Other Ambulatory Visit: Payer: Self-pay | Admitting: Internal Medicine

## 2018-09-28 DIAGNOSIS — D485 Neoplasm of uncertain behavior of skin: Secondary | ICD-10-CM | POA: Diagnosis not present

## 2018-10-03 ENCOUNTER — Other Ambulatory Visit: Payer: Self-pay | Admitting: Internal Medicine

## 2018-10-03 NOTE — Telephone Encounter (Signed)
[place] pt... Last filled 09/06/17 #90 with 3 refills but OV note says   Polymyalgia rheumatica ([place]) - Venia Carbon, MD at 09/06/2017 9:01 AM   Status: Written  Related Problem: Polymyalgia rheumatica ([place])    Seems to be in remission Will try to wean the prednisone further      please advise

## 2018-10-03 NOTE — Telephone Encounter (Signed)
This is a taper. He takes 5-10 mg every other day for PMR. Does he need a refill of the daily Prednisone.

## 2018-10-04 NOTE — Telephone Encounter (Signed)
Pt returning call to nurse

## 2018-10-04 NOTE — Telephone Encounter (Signed)
Left message on voicemail.

## 2018-10-10 ENCOUNTER — Other Ambulatory Visit: Payer: Self-pay | Admitting: Internal Medicine

## 2018-10-10 NOTE — Telephone Encounter (Signed)
Tried to call pt to verify what pharmacy it needs to be sent to

## 2018-10-10 NOTE — Telephone Encounter (Signed)
Patient said he called [facility] for a refill on his Prednisone.  [facility] told patient he would need to call here to get a refill.

## 2018-10-10 NOTE — Telephone Encounter (Signed)
[place]

## 2018-10-10 NOTE — Telephone Encounter (Signed)
That is because the pharmacy sent a request for a prednisone pack instead of his current rx. New rx sent.

## 2018-10-11 MED ORDER — PREDNISONE 5 MG PO TABS: 5.0000 mg | ORAL_TABLET | ORAL | 3 refills | Status: DC

## 2018-10-16 ENCOUNTER — Encounter: Payer: Self-pay | Admitting: Internal Medicine

## 2018-10-16 ENCOUNTER — Ambulatory Visit: Payer: Medicare Other | Admitting: Internal Medicine

## 2018-10-16 ENCOUNTER — Ambulatory Visit (INDEPENDENT_AMBULATORY_CARE_PROVIDER_SITE_OTHER): Payer: Medicare Other | Admitting: Internal Medicine

## 2018-10-16 ENCOUNTER — Other Ambulatory Visit: Payer: Self-pay

## 2018-10-16 VITALS — BP 120/78 | HR 97 | Temp 97.7°F | Ht 70.0 in | Wt 196.0 lb

## 2018-10-16 DIAGNOSIS — M25511 Pain in right shoulder: Secondary | ICD-10-CM | POA: Diagnosis not present

## 2018-10-16 DIAGNOSIS — K5903 Drug induced constipation: Secondary | ICD-10-CM | POA: Diagnosis not present

## 2018-10-16 DIAGNOSIS — K5900 Constipation, unspecified: Secondary | ICD-10-CM | POA: Insufficient documentation

## 2018-10-16 DIAGNOSIS — N401 Enlarged prostate with lower urinary tract symptoms: Secondary | ICD-10-CM | POA: Diagnosis not present

## 2018-10-16 DIAGNOSIS — N138 Other obstructive and reflux uropathy: Secondary | ICD-10-CM | POA: Diagnosis not present

## 2018-10-16 NOTE — Assessment & Plan Note (Signed)
Happened after left ankle---no findings in ankle to suggest gout or migratory arthritis Only mild findings in shoulder Continue tylenol

## 2018-10-16 NOTE — Assessment & Plan Note (Signed)
If ongoing severe problems, will set up with urologist

## 2018-10-16 NOTE — Assessment & Plan Note (Signed)
From the nortriptyline--which hasn't helped his voiding Will just stop it
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
## 2019-03-06 ENCOUNTER — Other Ambulatory Visit: Payer: Self-pay | Admitting: Internal Medicine

## 2019-04-23 ENCOUNTER — Ambulatory Visit (INDEPENDENT_AMBULATORY_CARE_PROVIDER_SITE_OTHER): Payer: Medicare Other | Admitting: Family Medicine

## 2019-04-23 ENCOUNTER — Encounter: Payer: Self-pay | Admitting: Family Medicine

## 2019-04-23 ENCOUNTER — Other Ambulatory Visit: Payer: Self-pay

## 2019-04-23 VITALS — BP 142/70 | HR 62 | Temp 98.0°F | Ht 70.0 in | Wt 194.0 lb

## 2019-04-23 DIAGNOSIS — Z23 Encounter for immunization: Secondary | ICD-10-CM

## 2019-04-23 DIAGNOSIS — T148XXA Other injury of unspecified body region, initial encounter: Secondary | ICD-10-CM

## 2019-04-23 MED ORDER — PREDNISONE 5 MG PO TABS: 5.0000 mg | ORAL_TABLET | ORAL | Status: DC

## 2019-04-23 NOTE — Patient Instructions (Signed)
Change the dressing daily.  Use nonstick bandages with neosporin.   Then put roll guaze over that.   When you take the bandage off daily, drip some soapy water on the cuts, then gently rinse with clean water.  Then pat dry and redress.   Recheck in about 1 week, sooner if needed.  If any fever or draining pus, then call us and get rechecked.  Take care.  Glad to see you.

## 2019-04-23 NOTE — Progress Notes (Signed)
Pushed by a cow into a fence this AM.  Hit R arm and R side of his face.  No LOC.  He was able to get up.  He was wearing a long sleeve shirt and no foreign body penetrated through the shirt.  He was able to control the bleeding and still able to move his arm.  He came in for evaluation today.  Meds, vitals, and allergies reviewed.   ROS: Per HPI unless specifically indicated in ROS section   nad He does not have any bony tenderness on the skull. Normal neck range of motion ctab rrr He has 4 lacerations.  He is approximately 1 cm laceration is superficial and not bleeding on the right upper chest.  This was cleaned and covered with a Band-Aid and Neosporin. He has 3 lacerations on the right arm.  Working proximal to distal he has 3 skin tears, 10 x 1 cm, 9 x 1 cm, and a distal "L" shaped skin tear that is 5 x 3 cm.  All are superficial.  None are bleeding significantly.  There is not enough tissue to suture back.  Discussed.  All of the wounds were cleaned with Betadine and saline and then covered with Neosporin and nonstick bandages and then roll gauze.  He still has normal range of motion at the shoulder elbow and wrist.  Normal grip.  Distally neurovascular intact. CN 2-12 wnl B, S/S grossly wnl x4

## 2019-04-25 DIAGNOSIS — T148XXA Other injury of unspecified body region, initial encounter: Secondary | ICD-10-CM | POA: Insufficient documentation

## 2019-04-25 NOTE — Assessment & Plan Note (Signed)
It does not look like he has any injuries that would need imaging at this point.  He is going to be sore tomorrow and likely the next day.  We talked about wound care.  See after visit summary.  At this point he still okay for outpatient follow-up.  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  Discussed.  Update [place] as needed.  We can recheck as needed.  I would expect these skin tears to gradually heal over the next few weeks.  Routed to PCP as FYI. >25 minutes spent in face to face time with patient, >50% spent in counselling or coordination of care.

## 2019-04-30 ENCOUNTER — Other Ambulatory Visit: Payer: Self-pay

## 2019-04-30 ENCOUNTER — Encounter: Payer: Self-pay | Admitting: Family Medicine

## 2019-04-30 ENCOUNTER — Ambulatory Visit (INDEPENDENT_AMBULATORY_CARE_PROVIDER_SITE_OTHER): Payer: Medicare Other | Admitting: Family Medicine

## 2019-04-30 DIAGNOSIS — R001 Bradycardia, unspecified: Secondary | ICD-10-CM | POA: Diagnosis not present

## 2019-04-30 DIAGNOSIS — T148XXA Other injury of unspecified body region, initial encounter: Secondary | ICD-10-CM

## 2019-04-30 NOTE — Progress Notes (Signed)
Recheck skin tears R arm. No FCNAVD. No pain.  Has been compliant with dressing changes.       Mild bradycardia noted.  He isn't lightheaded.  Feel well o/w.   Meds, vitals, and allergies reviewed.   ROS: Per HPI unless specifically indicated in ROS section   nad ncat Recheck pulse 60, rrr ctab R arm with 3 skin tears, from proximal to distal they measure 10 cm, 8cm, 5cm.  All appear improved from prev with good tissue apposition.  No purulent discharge.  Some local bruising but skin looks intact o/w, including the borders of the skin tears. Small lac on the R upper chest wall is nearly healed.   All redressed similar to last OV.  Distally NV intact.

## 2019-04-30 NOTE — Patient Instructions (Signed)
Your arm looks much better.  You are likely ahead of schedule.   Change the dressing daily.  Use nonstick bandages with neosporin.   Then put roll guaze over that.   When you take the bandage off daily, drip some soapy water on the cuts, then gently rinse with clean water.  Then pat dry and redress.   If any fever or draining pus, then call us and get rechecked.  Take care.  Glad to see you.  You don't have to set a follow up appointment at this point.

## 2019-04-30 NOTE — Assessment & Plan Note (Signed)
Healing, not infected.  Redressed and d/w pt.  See avs.  F/u prn.   He had bradycardia noted on intake but recheck 60 and rrr and no h/o heart rate concerns per patient report.  He isn't lightheaded. He can monitor and update [place] as needed.

## 2019-09-11 ENCOUNTER — Other Ambulatory Visit: Payer: Self-pay

## 2019-09-11 MED ORDER — FINASTERIDE 5 MG PO TABS: ORAL_TABLET | ORAL | 3 refills | Status: DC

## 2019-09-14 ENCOUNTER — Encounter: Payer: Medicare Other | Admitting: Internal Medicine

## 2019-10-30 ENCOUNTER — Other Ambulatory Visit: Payer: Self-pay

## 2019-10-30 ENCOUNTER — Encounter: Payer: Self-pay | Admitting: Internal Medicine

## 2019-10-30 ENCOUNTER — Ambulatory Visit (INDEPENDENT_AMBULATORY_CARE_PROVIDER_SITE_OTHER): Payer: Medicare Other | Admitting: Internal Medicine

## 2019-10-30 VITALS — BP 138/84 | HR 51 | Temp 97.7°F | Ht 70.0 in | Wt 188.0 lb

## 2019-10-30 DIAGNOSIS — I499 Cardiac arrhythmia, unspecified: Secondary | ICD-10-CM | POA: Diagnosis not present

## 2019-10-30 DIAGNOSIS — M353 Polymyalgia rheumatica: Secondary | ICD-10-CM | POA: Diagnosis not present

## 2019-10-30 DIAGNOSIS — N401 Enlarged prostate with lower urinary tract symptoms: Secondary | ICD-10-CM | POA: Diagnosis not present

## 2019-10-30 DIAGNOSIS — N138 Other obstructive and reflux uropathy: Secondary | ICD-10-CM | POA: Diagnosis not present

## 2019-10-30 DIAGNOSIS — Z7189 Other specified counseling: Secondary | ICD-10-CM

## 2019-10-30 DIAGNOSIS — Z0000 Encounter for general adult medical examination without abnormal findings: Secondary | ICD-10-CM

## 2019-10-30 DIAGNOSIS — I10 Essential (primary) hypertension: Secondary | ICD-10-CM | POA: Diagnosis not present

## 2019-10-30 LAB — COMPREHENSIVE METABOLIC PANEL
ALT: 12 U/L (ref 0–53)
AST: 21 U/L (ref 0–37)
Albumin: 4.2 g/dL (ref 3.5–5.2)
Alkaline Phosphatase: 95 U/L (ref 39–117)
BUN: 20 mg/dL (ref 6–23)
CO2: 31 mEq/L (ref 19–32)
Calcium: 9.4 mg/dL (ref 8.4–10.5)
Chloride: 107 mEq/L (ref 96–112)
Creatinine, Ser: 0.71 mg/dL (ref 0.40–1.50)
GFR: 106.01 mL/min (ref >60.00)
Glucose, Bld: 89 mg/dL (ref 70–99)
Potassium: 4 mEq/L (ref 3.5–5.1)
Sodium: 142 mEq/L (ref 135–145)
Total Bilirubin: 0.6 mg/dL (ref 0.2–1.2)
Total Protein: 7.2 g/dL (ref 6.0–8.3)

## 2019-10-30 LAB — CBC
HCT: 43.3 % (ref 39.0–52.0)
Hemoglobin: 14.4 g/dL (ref 13.0–17.0)
MCHC: 33.4 g/dL (ref 30.0–36.0)
MCV: 92.9 fl (ref 78.0–100.0)
Platelets: 169 10*3/uL (ref 150.0–400.0)
RBC: 4.66 Mil/uL (ref 4.22–5.81)
RDW: 14.5 % (ref 11.5–15.5)
WBC: 5.6 10*3/uL (ref 4.0–10.5)

## 2019-10-30 LAB — SEDIMENTATION RATE: Sed Rate: 16 mm/hr (ref 0–20)

## 2019-10-30 LAB — T4, FREE: Free T4: 1.07 ng/dL (ref 0.60–1.60)

## 2019-10-30 NOTE — Assessment & Plan Note (Signed)
Seems to be controlled with low dose prednisone Will check sed rate No further wean

## 2019-10-30 NOTE — Assessment & Plan Note (Signed)
Still with worrisome symptoms On dual therapy Discussed next step is urologist to consider procedure--he wants to wait on that
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
## 2019-10-30 NOTE — Assessment & Plan Note (Signed)
I have personally reviewed the Medicare Annual Wellness questionnaire and have noted 1. The patient's medical and social history 2. Their use of alcohol, tobacco or illicit drugs 3. Their current medications and supplements 4. The patient's functional ability including ADL's, fall risks, home safety risks and hearing or visual             impairment. 5. Diet and physical activities 6. Evidence for depression or mood disorders  The patients weight, height, BMI and visual acuity have been recorded in the chart I have made referrals, counseling and provided education to the patient based review of the above and I have provided the [name] with a written personalized care plan for preventive services.  I have provided you with a copy of your personalized plan for preventive services. Please take the time to review along with your updated medication list.  Annual flu vaccine Stays fit on farm No cancer screening due to age

## 2019-10-30 NOTE — Assessment & Plan Note (Signed)
See social history 

## 2019-10-30 NOTE — Assessment & Plan Note (Addendum)
If atrial fib---will need anticoagulation No symptoms  EKG shows sinus brady at 49. LAHB. PACs. No hypertrophy or ischemic changes. Since 03/30/16---rate is slightly slower  Reassured--no action needed

## 2019-10-30 NOTE — Assessment & Plan Note (Signed)
BP Readings from Last 3 Encounters:  10/30/19 138/84  04/30/19 124/68  04/23/19 (!) 142/70   Good control without meds Will check labs

## 2019-10-30 NOTE — Progress Notes (Signed)
Hearing Screening   125Hz  250Hz  500Hz  1000Hz  2000Hz  3000Hz  4000Hz  6000Hz  8000Hz   Right ear:           Left ear:           Comments: Has hearing aid for right ear. Not wearing today.   Vision Screening Comments: October 2020

## 2020-03-10 ENCOUNTER — Other Ambulatory Visit: Payer: Self-pay | Admitting: Internal Medicine

## 2020-05-18 DIAGNOSIS — Z23 Encounter for immunization: Secondary | ICD-10-CM | POA: Diagnosis not present

## 2020-06-02 DIAGNOSIS — Z23 Encounter for immunization: Secondary | ICD-10-CM | POA: Diagnosis not present

## 2020-09-10 ENCOUNTER — Other Ambulatory Visit: Payer: Self-pay | Admitting: Internal Medicine

## 2020-10-16 DIAGNOSIS — B9689 Other specified bacterial agents as the cause of diseases classified elsewhere: Secondary | ICD-10-CM | POA: Diagnosis not present

## 2020-10-16 DIAGNOSIS — M353 Polymyalgia rheumatica: Secondary | ICD-10-CM | POA: Diagnosis not present

## 2020-10-16 DIAGNOSIS — J019 Acute sinusitis, unspecified: Secondary | ICD-10-CM | POA: Diagnosis not present

## 2020-10-16 DIAGNOSIS — J209 Acute bronchitis, unspecified: Secondary | ICD-10-CM | POA: Diagnosis not present

## 2020-10-16 DIAGNOSIS — H9193 Unspecified hearing loss, bilateral: Secondary | ICD-10-CM | POA: Diagnosis not present

## 2020-10-31 ENCOUNTER — Encounter: Payer: Self-pay | Admitting: Internal Medicine

## 2020-10-31 ENCOUNTER — Other Ambulatory Visit: Payer: Self-pay

## 2020-10-31 ENCOUNTER — Ambulatory Visit (INDEPENDENT_AMBULATORY_CARE_PROVIDER_SITE_OTHER): Payer: Medicare Other | Admitting: Internal Medicine

## 2020-10-31 VITALS — BP 128/70 | HR 58 | Temp 97.8°F | Ht 70.0 in | Wt 184.0 lb

## 2020-10-31 DIAGNOSIS — I499 Cardiac arrhythmia, unspecified: Secondary | ICD-10-CM | POA: Diagnosis not present

## 2020-10-31 DIAGNOSIS — Z7189 Other specified counseling: Secondary | ICD-10-CM | POA: Diagnosis not present

## 2020-10-31 DIAGNOSIS — M353 Polymyalgia rheumatica: Secondary | ICD-10-CM | POA: Diagnosis not present

## 2020-10-31 DIAGNOSIS — D510 Vitamin B12 deficiency anemia due to intrinsic factor deficiency: Secondary | ICD-10-CM

## 2020-10-31 DIAGNOSIS — I10 Essential (primary) hypertension: Secondary | ICD-10-CM

## 2020-10-31 DIAGNOSIS — D6869 Other thrombophilia: Secondary | ICD-10-CM | POA: Diagnosis not present

## 2020-10-31 DIAGNOSIS — N138 Other obstructive and reflux uropathy: Secondary | ICD-10-CM | POA: Diagnosis not present

## 2020-10-31 DIAGNOSIS — Z0000 Encounter for general adult medical examination without abnormal findings: Secondary | ICD-10-CM | POA: Diagnosis not present

## 2020-10-31 DIAGNOSIS — N401 Enlarged prostate with lower urinary tract symptoms: Secondary | ICD-10-CM | POA: Diagnosis not present

## 2020-10-31 LAB — SEDIMENTATION RATE: Sed Rate: 12 mm/hr (ref 0–20)

## 2020-10-31 LAB — COMPREHENSIVE METABOLIC PANEL
ALT: 14 U/L (ref 0–53)
AST: 20 U/L (ref 0–37)
Albumin: 4.2 g/dL (ref 3.5–5.2)
Alkaline Phosphatase: 90 U/L (ref 39–117)
BUN: 19 mg/dL (ref 6–23)
CO2: 30 mEq/L (ref 19–32)
Calcium: 9.6 mg/dL (ref 8.4–10.5)
Chloride: 107 mEq/L (ref 96–112)
Creatinine, Ser: 0.78 mg/dL (ref 0.40–1.50)
GFR: 82.31 mL/min (ref >60.00)
Glucose, Bld: 94 mg/dL (ref 70–99)
Potassium: 4.2 mEq/L (ref 3.5–5.1)
Sodium: 144 mEq/L (ref 135–145)
Total Bilirubin: 0.6 mg/dL (ref 0.2–1.2)
Total Protein: 7 g/dL (ref 6.0–8.3)

## 2020-10-31 LAB — CBC
HCT: 43.9 % (ref 39.0–52.0)
Hemoglobin: 14.6 g/dL (ref 13.0–17.0)
MCHC: 33.4 g/dL (ref 30.0–36.0)
MCV: 90.3 fl (ref 78.0–100.0)
Platelets: 182 10*3/uL (ref 150.0–400.0)
RBC: 4.86 Mil/uL (ref 4.22–5.81)
RDW: 14.5 % (ref 11.5–15.5)
WBC: 6.9 10*3/uL (ref 4.0–10.5)

## 2020-10-31 LAB — T4, FREE: Free T4: 1.09 ng/dL (ref 0.60–1.60)

## 2020-10-31 MED ORDER — PREDNISONE 5 MG PO TABS: 5.0000 mg | ORAL_TABLET | ORAL | 0 refills | Status: DC

## 2020-10-31 NOTE — Assessment & Plan Note (Signed)
BP Readings from Last 3 Encounters:  10/31/20 128/70  10/30/19 138/84  04/30/19 124/68   Okay without meds

## 2020-10-31 NOTE — Assessment & Plan Note (Signed)
Very easy bruising on the aspirin If sinus, will stop it
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
## 2020-10-31 NOTE — Assessment & Plan Note (Signed)
Doing well on the prednisone every other day

## 2020-10-31 NOTE — Assessment & Plan Note (Signed)
Is on the B12

## 2020-10-31 NOTE — Assessment & Plan Note (Signed)
Ongoing problems despite the finasteride and tamsulosin Discussed surgery as the next step --he doesn't want this

## 2020-10-31 NOTE — Assessment & Plan Note (Signed)
See social history 

## 2020-10-31 NOTE — Assessment & Plan Note (Signed)
I have personally reviewed the Medicare Annual Wellness questionnaire and have noted 1. The patient's medical and social history 2. Their use of alcohol, tobacco or illicit drugs 3. Their current medications and supplements 4. The patient's functional ability including ADL's, fall risks, home safety risks and hearing or visual             impairment. 5. Diet and physical activities 6. Evidence for depression or mood disorders  The patients weight, height, BMI and visual acuity have been recorded in the chart I have made referrals, counseling and provided education to the patient based review of the above and I have provided the pt with a written personalized care plan for preventive services.  I have provided you with a copy of your personalized plan for preventive services. Please take the time to review along with your updated medication list.  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

## 2020-10-31 NOTE — Assessment & Plan Note (Addendum)
Not new---will check EKG again to be sure not atrial fib (was sinus brady with PACs last year)  Once again--EKG shows sinus bradycardia with PAC. LAD. No ischemic changes or hypertrophy. No sig change from last March  Will stop the ASA

## 2021-03-09 ENCOUNTER — Other Ambulatory Visit: Payer: Self-pay | Admitting: Internal Medicine

## 2021-03-10 ENCOUNTER — Other Ambulatory Visit: Payer: Self-pay | Admitting: Internal Medicine

## 2021-05-14 DIAGNOSIS — Z23 Encounter for immunization: Secondary | ICD-10-CM | POA: Diagnosis not present

## 2021-09-26 ENCOUNTER — Other Ambulatory Visit: Payer: Self-pay | Admitting: Internal Medicine

## 2021-10-08 NOTE — Telephone Encounter (Signed)
Pt call in wants to  know status of Rx finasteride (PROSCAR) 5 MG tablet  refill stated he's almost out for RX  please advise [phone]  ?

## 2021-10-08 NOTE — Telephone Encounter (Signed)
Spoke to pt. Advised him it was sent in 09-27-21 to [facility] mail order. He will call them again. ?

## 2021-10-08 NOTE — Telephone Encounter (Deleted)
Pt called id want ?

## 2021-11-10 ENCOUNTER — Ambulatory Visit (INDEPENDENT_AMBULATORY_CARE_PROVIDER_SITE_OTHER): Payer: Medicare Other | Admitting: Internal Medicine

## 2021-11-10 ENCOUNTER — Encounter: Payer: Self-pay | Admitting: Internal Medicine

## 2021-11-10 VITALS — BP 126/74 | HR 67 | Ht 70.0 in | Wt 185.0 lb

## 2021-11-10 DIAGNOSIS — M353 Polymyalgia rheumatica: Secondary | ICD-10-CM

## 2021-11-10 DIAGNOSIS — Z0000 Encounter for general adult medical examination without abnormal findings: Secondary | ICD-10-CM

## 2021-11-10 DIAGNOSIS — N138 Other obstructive and reflux uropathy: Secondary | ICD-10-CM | POA: Diagnosis not present

## 2021-11-10 DIAGNOSIS — N401 Enlarged prostate with lower urinary tract symptoms: Secondary | ICD-10-CM | POA: Diagnosis not present

## 2021-11-10 DIAGNOSIS — M159 Polyosteoarthritis, unspecified: Secondary | ICD-10-CM | POA: Diagnosis not present

## 2021-11-10 DIAGNOSIS — I10 Essential (primary) hypertension: Secondary | ICD-10-CM | POA: Diagnosis not present

## 2021-11-10 LAB — COMPREHENSIVE METABOLIC PANEL
ALT: 14 U/L (ref 0–53)
AST: 33 U/L (ref 0–37)
Albumin: 4.2 g/dL (ref 3.5–5.2)
Alkaline Phosphatase: 105 U/L (ref 39–117)
BUN: 20 mg/dL (ref 6–23)
CO2: 29 mEq/L (ref 19–32)
Calcium: 9.7 mg/dL (ref 8.4–10.5)
Chloride: 105 mEq/L (ref 96–112)
Creatinine, Ser: 0.79 mg/dL (ref 0.40–1.50)
GFR: 81.41 mL/min (ref >60.00)
Glucose, Bld: 89 mg/dL (ref 70–99)
Potassium: 4.1 mEq/L (ref 3.5–5.1)
Sodium: 141 mEq/L (ref 135–145)
Total Bilirubin: 0.6 mg/dL (ref 0.2–1.2)
Total Protein: 7.4 g/dL (ref 6.0–8.3)

## 2021-11-10 LAB — CBC
HCT: 43.1 % (ref 39.0–52.0)
Hemoglobin: 14.6 g/dL (ref 13.0–17.0)
MCHC: 33.9 g/dL (ref 30.0–36.0)
MCV: 91.1 fl (ref 78.0–100.0)
Platelets: 175 10*3/uL (ref 150.0–400.0)
RBC: 4.73 Mil/uL (ref 4.22–5.81)
RDW: 14.2 % (ref 11.5–15.5)
WBC: 5.5 10*3/uL (ref 4.0–10.5)

## 2021-11-10 LAB — SEDIMENTATION RATE: Sed Rate: 17 mm/hr (ref 0–20)

## 2021-11-10 MED ORDER — TAMSULOSIN HCL 0.4 MG PO CAPS
0.8000 mg | ORAL_CAPSULE | Freq: Every day | ORAL | 3 refills | Status: DC
Start: 2021-11-10 — End: 2022-01-12

## 2021-11-10 MED ORDER — TAMSULOSIN HCL 0.4 MG PO CAPS: 0.8000 mg | ORAL_CAPSULE | Freq: Every day | ORAL | 3 refills | Status: DC

## 2021-11-10 NOTE — Assessment & Plan Note (Signed)
Seems to be okay off the prednisone ?If sed rate is elevated---would restart '10mg'$  daily and wean quickly ?

## 2021-11-10 NOTE — Assessment & Plan Note (Signed)
BP Readings from Last 3 Encounters:  ?11/10/21 126/74  ?10/31/20 128/70  ?10/30/19 138/84  ? ?BP still okay without meds ?

## 2021-11-10 NOTE — Patient Instructions (Signed)
Increase the tamsulosin to 2 capsules daily ---to see if that helps you void less often ?
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
## 2021-11-10 NOTE — Assessment & Plan Note (Signed)
Discussed that he should use tylenol 650 or '1000mg'$  (bid or tid) when he has pain----not prednisone ?

## 2021-11-10 NOTE — Assessment & Plan Note (Signed)
I have personally reviewed the Medicare Annual Wellness questionnaire and have noted  ?1. The patient's medical and social history  ?2. Their use of alcohol, tobacco or illicit drugs  ?3. Their current medications and supplements  ?4. The patient's functional ability including ADL's, fall risks, home safety risks and hearing or visual  ?            impairment.  ?5. Diet and physical activities  ?6. Evidence for depression or mood disorders ? ?The patients weight, height, BMI and visual acuity have been recorded in the chart  ?I have made referrals, counseling and provided education to the patient based review of the above and I have provided the pt with a written personalized care plan for preventive services.  ? ?I have provided you with a copy of your personalized plan for preventive services. Please take the time to review along with your updated medication list. ? 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 ?

## 2021-11-10 NOTE — Assessment & Plan Note (Signed)
Ongoing symptoms despite finasteride 5/tamsulosin 0.'4mg'$  ?Will try increasing the tamsulosin to 2 tabs daily ?

## 2021-11-10 NOTE — Addendum Note (Signed)
Addended by: Carter Kitten on: 11/10/2021 10:09 AM ? ? Modules accepted: Orders ? ?

## 2021-11-11 ENCOUNTER — Telehealth: Payer: Self-pay

## 2021-11-11 NOTE — Telephone Encounter (Signed)
-----   Message from Venia Carbon, MD sent at 11/11/2021  1:43 PM EDT ----- ?Please send copy ?Your labs are all normal ?This is a reassuring report! ?

## 2021-11-11 NOTE — Telephone Encounter (Signed)
Called and lvm for patient to call us back regarding lab results. ?

## 2021-11-11 NOTE — Telephone Encounter (Signed)
F/u   Patient returning call back to the [place]

## 2021-11-12 NOTE — Telephone Encounter (Signed)
Called and lvm for pt call us back regarding his lab results ?

## 2021-11-12 NOTE — Telephone Encounter (Signed)
Pt returned call regarding lab results . I was advise by Amy supervisor to make pt aware that his labs was normal  ?

## 2021-11-13 NOTE — Telephone Encounter (Signed)
Tried to called  his phone line was busy ? ?

## 2021-11-13 NOTE — Telephone Encounter (Signed)
Spoke with patient regarding his results. 

## 2021-11-30 DIAGNOSIS — I213 ST elevation (STEMI) myocardial infarction of unspecified site: Secondary | ICD-10-CM

## 2021-11-30 HISTORY — DX: ST elevation (STEMI) myocardial infarction of unspecified site: I21.3

## 2021-12-23 ENCOUNTER — Encounter: Payer: Self-pay | Admitting: Emergency Medicine

## 2021-12-23 ENCOUNTER — Inpatient Hospital Stay
Admission: EM | Admit: 2021-12-23 | Discharge: 2021-12-25 | DRG: 247 | Disposition: A | Discharge status: Home / Self Care | Payer: Medicare Other | Attending: Hospitalist | Admitting: Hospitalist

## 2021-12-23 ENCOUNTER — Inpatient Hospital Stay (HOSPITAL_COMMUNITY)
Admit: 2021-12-23 | Discharge: 2021-12-23 | Disposition: A | Discharge status: Home / Self Care | Payer: Medicare Other | Attending: Internal Medicine | Admitting: Internal Medicine

## 2021-12-23 ENCOUNTER — Other Ambulatory Visit: Payer: Self-pay

## 2021-12-23 ENCOUNTER — Other Ambulatory Visit (HOSPITAL_COMMUNITY): Payer: Self-pay

## 2021-12-23 ENCOUNTER — Encounter
Admission: EM | Disposition: A | Discharge status: Home / Self Care | Payer: Self-pay | Source: Home / Self Care | Attending: Hospitalist

## 2021-12-23 DIAGNOSIS — I5020 Unspecified systolic (congestive) heart failure: Secondary | ICD-10-CM

## 2021-12-23 DIAGNOSIS — I2510 Atherosclerotic heart disease of native coronary artery without angina pectoris: Secondary | ICD-10-CM

## 2021-12-23 DIAGNOSIS — M1990 Unspecified osteoarthritis, unspecified site: Secondary | ICD-10-CM | POA: Diagnosis present

## 2021-12-23 DIAGNOSIS — I255 Ischemic cardiomyopathy: Secondary | ICD-10-CM | POA: Diagnosis not present

## 2021-12-23 DIAGNOSIS — Z20822 Contact with and (suspected) exposure to covid-19: Secondary | ICD-10-CM | POA: Diagnosis present

## 2021-12-23 DIAGNOSIS — H9190 Unspecified hearing loss, unspecified ear: Secondary | ICD-10-CM | POA: Diagnosis present

## 2021-12-23 DIAGNOSIS — I5021 Acute systolic (congestive) heart failure: Secondary | ICD-10-CM

## 2021-12-23 DIAGNOSIS — I25118 Atherosclerotic heart disease of native coronary artery with other forms of angina pectoris: Secondary | ICD-10-CM | POA: Diagnosis not present

## 2021-12-23 DIAGNOSIS — M353 Polymyalgia rheumatica: Secondary | ICD-10-CM | POA: Diagnosis not present

## 2021-12-23 DIAGNOSIS — N401 Enlarged prostate with lower urinary tract symptoms: Secondary | ICD-10-CM | POA: Diagnosis present

## 2021-12-23 DIAGNOSIS — I2102 ST elevation (STEMI) myocardial infarction involving left anterior descending coronary artery: Secondary | ICD-10-CM

## 2021-12-23 DIAGNOSIS — Z9861 Coronary angioplasty status: Secondary | ICD-10-CM | POA: Diagnosis not present

## 2021-12-23 DIAGNOSIS — I213 ST elevation (STEMI) myocardial infarction of unspecified site: Secondary | ICD-10-CM | POA: Insufficient documentation

## 2021-12-23 DIAGNOSIS — Z86718 Personal history of other venous thrombosis and embolism: Secondary | ICD-10-CM | POA: Diagnosis not present

## 2021-12-23 DIAGNOSIS — Z96653 Presence of artificial knee joint, bilateral: Secondary | ICD-10-CM | POA: Diagnosis present

## 2021-12-23 DIAGNOSIS — I10 Essential (primary) hypertension: Secondary | ICD-10-CM

## 2021-12-23 DIAGNOSIS — I2109 ST elevation (STEMI) myocardial infarction involving other coronary artery of anterior wall: Secondary | ICD-10-CM

## 2021-12-23 DIAGNOSIS — R079 Chest pain, unspecified: Secondary | ICD-10-CM | POA: Diagnosis not present

## 2021-12-23 DIAGNOSIS — N138 Other obstructive and reflux uropathy: Secondary | ICD-10-CM | POA: Diagnosis present

## 2021-12-23 DIAGNOSIS — Z79899 Other long term (current) drug therapy: Secondary | ICD-10-CM

## 2021-12-23 DIAGNOSIS — E538 Deficiency of other specified B group vitamins: Secondary | ICD-10-CM | POA: Diagnosis not present

## 2021-12-23 DIAGNOSIS — E782 Mixed hyperlipidemia: Secondary | ICD-10-CM | POA: Diagnosis not present

## 2021-12-23 DIAGNOSIS — I5022 Chronic systolic (congestive) heart failure: Secondary | ICD-10-CM

## 2021-12-23 HISTORY — DX: ST elevation (STEMI) myocardial infarction involving other coronary artery of anterior wall: I21.09

## 2021-12-23 HISTORY — PX: CORONARY/GRAFT ACUTE MI REVASCULARIZATION: CATH118305

## 2021-12-23 HISTORY — DX: Atherosclerotic heart disease of native coronary artery without angina pectoris: I25.10

## 2021-12-23 HISTORY — DX: Ischemic cardiomyopathy: I25.5

## 2021-12-23 HISTORY — DX: Unspecified systolic (congestive) heart failure: I50.20

## 2021-12-23 HISTORY — PX: CORONARY ANGIOGRAPHY: CATH118303

## 2021-12-23 LAB — HEMOGLOBIN A1C
Hgb A1c MFr Bld: 5.1 % (ref 4.8–5.6)
Mean Plasma Glucose: 99.67 mg/dL

## 2021-12-23 LAB — TROPONIN I (HIGH SENSITIVITY)
Troponin I (High Sensitivity): 271 ng/L (ref <18)
Troponin I (High Sensitivity): >24000 ng/L (ref <18)

## 2021-12-23 LAB — CBC
HCT: 39.2 % (ref 39.0–52.0)
Hemoglobin: 13.1 g/dL (ref 13.0–17.0)
MCH: 29.9 pg (ref 26.0–34.0)
MCHC: 33.4 g/dL (ref 30.0–36.0)
MCV: 89.5 fL (ref 80.0–100.0)
Platelets: 145 10*3/uL — ABNORMAL LOW (ref 150–400)
RBC: 4.38 MIL/uL (ref 4.22–5.81)
RDW: 13 % (ref 11.5–15.5)
WBC: 6.3 10*3/uL (ref 4.0–10.5)
nRBC: 0 % (ref 0.0–0.2)

## 2021-12-23 LAB — COMPREHENSIVE METABOLIC PANEL
ALT: 14 U/L (ref 0–44)
AST: 32 U/L (ref 15–41)
Albumin: 4 g/dL (ref 3.5–5.0)
Alkaline Phosphatase: 98 U/L (ref 38–126)
Anion gap: 6 (ref 5–15)
BUN: 17 mg/dL (ref 8–23)
CO2: 27 mmol/L (ref 22–32)
Calcium: 9.3 mg/dL (ref 8.9–10.3)
Chloride: 106 mmol/L (ref 98–111)
Creatinine, Ser: 0.85 mg/dL (ref 0.61–1.24)
GFR, Estimated: >60 mL/min (ref >60)
Glucose, Bld: 199 mg/dL — ABNORMAL HIGH (ref 70–99)
Potassium: 3.4 mmol/L — ABNORMAL LOW (ref 3.5–5.1)
Sodium: 139 mmol/L (ref 135–145)
Total Bilirubin: 0.6 mg/dL (ref 0.3–1.2)
Total Protein: 7.2 g/dL (ref 6.5–8.1)

## 2021-12-23 LAB — ECHOCARDIOGRAM COMPLETE
AR max vel: 3.84 cm2
AV Area VTI: 4.45 cm2
AV Area mean vel: 3.72 cm2
AV Mean grad: 2 mmHg
AV Peak grad: 4 mmHg
Ao pk vel: 1 m/s
Area-P 1/2: 6.94 cm2
Calc EF: 40.6 %
Height: 72 in
MV VTI: 4.27 cm2
S' Lateral: 3.3 cm
Single Plane A2C EF: 41.8 %
Single Plane A4C EF: 39.3 %
Weight: 2987.67 oz

## 2021-12-23 LAB — CBC WITH DIFFERENTIAL/PLATELET
Abs Immature Granulocytes: 0.02 10*3/uL (ref 0.00–0.07)
Basophils Absolute: 0.1 10*3/uL (ref 0.0–0.1)
Basophils Relative: 1 %
Eosinophils Absolute: 0.1 10*3/uL (ref 0.0–0.5)
Eosinophils Relative: 2 %
HCT: 42.9 % (ref 39.0–52.0)
Hemoglobin: 13.9 g/dL (ref 13.0–17.0)
Immature Granulocytes: 0 %
Lymphocytes Relative: 35 %
Lymphs Abs: 2.1 10*3/uL (ref 0.7–4.0)
MCH: 30 pg (ref 26.0–34.0)
MCHC: 32.4 g/dL (ref 30.0–36.0)
MCV: 92.7 fL (ref 80.0–100.0)
Monocytes Absolute: 0.5 10*3/uL (ref 0.1–1.0)
Monocytes Relative: 8 %
Neutro Abs: 3.2 10*3/uL (ref 1.7–7.7)
Neutrophils Relative %: 54 %
Platelets: 151 10*3/uL (ref 150–400)
RBC: 4.63 MIL/uL (ref 4.22–5.81)
RDW: 13.2 % (ref 11.5–15.5)
WBC: 6 10*3/uL (ref 4.0–10.5)
nRBC: 0 % (ref 0.0–0.2)

## 2021-12-23 LAB — BASIC METABOLIC PANEL
Anion gap: 7 (ref 5–15)
BUN: 14 mg/dL (ref 8–23)
CO2: 23 mmol/L (ref 22–32)
Calcium: 8.8 mg/dL — ABNORMAL LOW (ref 8.9–10.3)
Chloride: 106 mmol/L (ref 98–111)
Creatinine, Ser: 0.6 mg/dL — ABNORMAL LOW (ref 0.61–1.24)
GFR, Estimated: >60 mL/min (ref >60)
Glucose, Bld: 126 mg/dL — ABNORMAL HIGH (ref 70–99)
Potassium: 3.7 mmol/L (ref 3.5–5.1)
Sodium: 136 mmol/L (ref 135–145)

## 2021-12-23 LAB — POCT ACTIVATED CLOTTING TIME
Activated Clotting Time: 227 seconds
Activated Clotting Time: 251 seconds
Activated Clotting Time: 287 seconds

## 2021-12-23 LAB — MRSA NEXT GEN BY PCR, NASAL: MRSA by PCR Next Gen: NOT DETECTED

## 2021-12-23 LAB — RESP PANEL BY RT-PCR (FLU A&B, COVID) ARPGX2
Influenza A by PCR: NEGATIVE
Influenza B by PCR: NEGATIVE
SARS Coronavirus 2 by RT PCR: NEGATIVE

## 2021-12-23 LAB — LIPID PANEL
Cholesterol: 159 mg/dL (ref 0–200)
HDL: 47 mg/dL (ref >40)
LDL Cholesterol: 99 mg/dL (ref 0–99)
Total CHOL/HDL Ratio: 3.4 RATIO
Triglycerides: 63 mg/dL (ref <150)
VLDL: 13 mg/dL (ref 0–40)

## 2021-12-23 LAB — GLUCOSE, CAPILLARY: Glucose-Capillary: 140 mg/dL — ABNORMAL HIGH (ref 70–99)

## 2021-12-23 LAB — APTT: aPTT: 30 seconds (ref 24–36)

## 2021-12-23 LAB — PROTIME-INR
INR: 1.1 (ref 0.8–1.2)
Prothrombin Time: 14.3 seconds (ref 11.4–15.2)

## 2021-12-23 SURGERY — CORONARY/GRAFT ACUTE MI REVASCULARIZATION
Anesthesia: Moderate Sedation

## 2021-12-23 MED ORDER — FENTANYL CITRATE (PF) 100 MCG/2ML IJ SOLN
INTRAMUSCULAR | Status: AC
  Filled 2021-12-23: qty 2

## 2021-12-23 MED ORDER — ASPIRIN 81 MG PO CHEW
324.0000 mg | CHEWABLE_TABLET | Freq: Once | ORAL | Status: AC
  Administered 2021-12-23: 324 mg via ORAL

## 2021-12-23 MED ORDER — TICAGRELOR 90 MG PO TABS
ORAL_TABLET | ORAL | Status: AC
  Filled 2021-12-23: qty 2

## 2021-12-23 MED ORDER — LABETALOL HCL 5 MG/ML IV SOLN: 10.0000 mg | INTRAVENOUS | Status: AC | PRN

## 2021-12-23 MED ORDER — ENOXAPARIN SODIUM 40 MG/0.4ML IJ SOSY
40.0000 mg | PREFILLED_SYRINGE | INTRAMUSCULAR | Status: DC
  Administered 2021-12-24: 40 mg via SUBCUTANEOUS
  Filled 2021-12-23: qty 0.4

## 2021-12-23 MED ORDER — MIDAZOLAM HCL 2 MG/2ML IJ SOLN
INTRAMUSCULAR | Status: AC
  Filled 2021-12-23: qty 2

## 2021-12-23 MED ORDER — FENTANYL CITRATE (PF) 100 MCG/2ML IJ SOLN
INTRAMUSCULAR | Status: DC | PRN
Start: 2021-12-23 — End: 2021-12-23
  Administered 2021-12-23: 25 ug via INTRAVENOUS

## 2021-12-23 MED ORDER — NITROGLYCERIN 1 MG/10 ML FOR IR/CATH LAB
INTRA_ARTERIAL | Status: DC | PRN
  Administered 2021-12-23 (×2): 200 ug via INTRACORONARY

## 2021-12-23 MED ORDER — IOHEXOL 300 MG/ML  SOLN
INTRAMUSCULAR | Status: DC | PRN
  Administered 2021-12-23: 110 mL

## 2021-12-23 MED ORDER — TICAGRELOR 90 MG PO TABS
90.0000 mg | ORAL_TABLET | Freq: Two times a day (BID) | ORAL | Status: DC
  Administered 2021-12-23 – 2021-12-25 (×5): 90 mg via ORAL
  Filled 2021-12-23 (×5): qty 1

## 2021-12-23 MED ORDER — ASPIRIN 81 MG PO CHEW
81.0000 mg | CHEWABLE_TABLET | Freq: Every day | ORAL | Status: DC
  Administered 2021-12-23 – 2021-12-25 (×3): 81 mg via ORAL
  Filled 2021-12-23 (×3): qty 1

## 2021-12-23 MED ORDER — HEPARIN (PORCINE) IN NACL 1000-0.9 UT/500ML-% IV SOLN
INTRAVENOUS | Status: AC
  Filled 2021-12-23: qty 1000

## 2021-12-23 MED ORDER — ENOXAPARIN SODIUM 40 MG/0.4ML IJ SOSY: 40.0000 mg | PREFILLED_SYRINGE | INTRAMUSCULAR | Status: DC

## 2021-12-23 MED ORDER — LIDOCAINE HCL 1 % IJ SOLN
INTRAMUSCULAR | Status: AC
  Filled 2021-12-23: qty 20

## 2021-12-23 MED ORDER — FINASTERIDE 5 MG PO TABS
5.0000 mg | ORAL_TABLET | Freq: Every day | ORAL | Status: DC
  Administered 2021-12-23 – 2021-12-25 (×3): 5 mg via ORAL
  Filled 2021-12-23 (×3): qty 1

## 2021-12-23 MED ORDER — NITROGLYCERIN 0.4 MG SL SUBL
0.4000 mg | SUBLINGUAL_TABLET | SUBLINGUAL | Status: DC | PRN
  Administered 2021-12-23 (×2): 0.4 mg via SUBLINGUAL
  Filled 2021-12-23: qty 1

## 2021-12-23 MED ORDER — VERAPAMIL HCL 2.5 MG/ML IV SOLN
INTRAVENOUS | Status: AC
  Filled 2021-12-23: qty 2

## 2021-12-23 MED ORDER — SODIUM CHLORIDE 0.9 % IV SOLN: INTRAVENOUS | Status: AC

## 2021-12-23 MED ORDER — TICAGRELOR 60 MG PO TABS
ORAL_TABLET | ORAL | Status: DC | PRN
Start: 2021-12-23 — End: 2021-12-23
  Administered 2021-12-23: 180 mg via ORAL

## 2021-12-23 MED ORDER — HEPARIN SODIUM (PORCINE) 5000 UNIT/ML IJ SOLN
4000.0000 [IU] | Freq: Once | INTRAMUSCULAR | Status: AC
  Administered 2021-12-23: 4000 [IU] via INTRAVENOUS

## 2021-12-23 MED ORDER — SODIUM CHLORIDE 0.9% FLUSH
3.0000 mL | Freq: Two times a day (BID) | INTRAVENOUS | Status: DC
  Administered 2021-12-23 – 2021-12-25 (×5): 3 mL via INTRAVENOUS

## 2021-12-23 MED ORDER — ATORVASTATIN CALCIUM 20 MG PO TABS
40.0000 mg | ORAL_TABLET | Freq: Every day | ORAL | Status: DC
  Administered 2021-12-23 – 2021-12-25 (×3): 40 mg via ORAL
  Filled 2021-12-23 (×3): qty 2

## 2021-12-23 MED ORDER — LIDOCAINE HCL (PF) 1 % IJ SOLN
INTRAMUSCULAR | Status: DC | PRN
  Administered 2021-12-23: 2 mL

## 2021-12-23 MED ORDER — TAMSULOSIN HCL 0.4 MG PO CAPS
0.8000 mg | ORAL_CAPSULE | Freq: Every day | ORAL | Status: DC
Start: 2021-12-23 — End: 2021-12-25
  Administered 2021-12-23 – 2021-12-25 (×3): 0.8 mg via ORAL
  Filled 2021-12-23 (×3): qty 2

## 2021-12-23 MED ORDER — ONDANSETRON HCL 4 MG/2ML IJ SOLN: 4.0000 mg | Freq: Four times a day (QID) | INTRAMUSCULAR | Status: DC | PRN

## 2021-12-23 MED ORDER — CHLORHEXIDINE GLUCONATE CLOTH 2 % EX PADS
6.0000 | MEDICATED_PAD | Freq: Every day | CUTANEOUS | Status: DC
  Administered 2021-12-24 – 2021-12-25 (×2): 6 via TOPICAL

## 2021-12-23 MED ORDER — SODIUM CHLORIDE 0.9 % IV SOLN: 250.0000 mL | INTRAVENOUS | Status: DC | PRN

## 2021-12-23 MED ORDER — METOPROLOL SUCCINATE ER 25 MG PO TB24
25.0000 mg | ORAL_TABLET | Freq: Every day | ORAL | Status: DC
  Administered 2021-12-24 – 2021-12-25 (×2): 25 mg via ORAL
  Filled 2021-12-23 (×2): qty 1

## 2021-12-23 MED ORDER — SODIUM CHLORIDE 0.9% FLUSH: 3.0000 mL | INTRAVENOUS | Status: DC | PRN

## 2021-12-23 MED ORDER — VERAPAMIL HCL 2.5 MG/ML IV SOLN
INTRAVENOUS | Status: DC | PRN
  Administered 2021-12-23: 2.5 mg via INTRAVENOUS

## 2021-12-23 MED ORDER — SODIUM CHLORIDE 0.9 % IV SOLN: INTRAVENOUS | Status: DC

## 2021-12-23 MED ORDER — HEPARIN SODIUM (PORCINE) 1000 UNIT/ML IJ SOLN
INTRAMUSCULAR | Status: AC
  Filled 2021-12-23: qty 10

## 2021-12-23 MED ORDER — HYDRALAZINE HCL 20 MG/ML IJ SOLN: 10.0000 mg | INTRAMUSCULAR | Status: AC | PRN

## 2021-12-23 MED ORDER — PERFLUTREN LIPID MICROSPHERE
1.0000 mL | INTRAVENOUS | Status: AC | PRN
  Administered 2021-12-23: 2 mL via INTRAVENOUS

## 2021-12-23 MED ORDER — ACETAMINOPHEN 325 MG PO TABS
650.0000 mg | ORAL_TABLET | ORAL | Status: DC | PRN
  Administered 2021-12-23: 650 mg via ORAL
  Filled 2021-12-23: qty 2

## 2021-12-23 MED ORDER — HEPARIN (PORCINE) IN NACL 1000-0.9 UT/500ML-% IV SOLN
INTRAVENOUS | Status: DC | PRN
Start: 2021-12-23 — End: 2021-12-23
  Administered 2021-12-23 (×2): 500 mL

## 2021-12-23 MED ORDER — HEPARIN SODIUM (PORCINE) 1000 UNIT/ML IJ SOLN
INTRAMUSCULAR | Status: DC | PRN
  Administered 2021-12-23: 3000 [IU] via INTRAVENOUS
  Administered 2021-12-23: 4000 [IU] via INTRAVENOUS
  Administered 2021-12-23: 3000 [IU] via INTRAVENOUS

## 2021-12-23 SURGICAL SUPPLY — 20 items
BALLN TREK RX 2.5X12 (BALLOONS) ×2
BALLN ~~LOC~~ TREK NEO RX 3.5X15 (BALLOONS) ×2
BALLOON TREK RX 2.5X12 (BALLOONS) IMPLANT
BALLOON ~~LOC~~ TREK NEO RX 3.5X15 (BALLOONS) IMPLANT
CATH 5FR JR4 DIAGNOSTIC (CATHETERS) ×1 IMPLANT
CATH INFINITI 5FR ANG PIGTAIL (CATHETERS) ×1 IMPLANT
CATH LAUNCHER 6FR EBU3.5 (CATHETERS) ×1 IMPLANT
DEVICE RAD TR BAND REGULAR (VASCULAR PRODUCTS) ×1 IMPLANT
DRAPE BRACHIAL (DRAPES) ×1 IMPLANT
GLIDESHEATH SLEND SS 6F .021 (SHEATH) ×1 IMPLANT
GUIDEWIRE INQWIRE 1.5J.035X260 (WIRE) IMPLANT
INQWIRE 1.5J .035X260CM (WIRE) ×2
KIT ENCORE 26 ADVANTAGE (KITS) ×1 IMPLANT
PACK CARDIAC CATH (CUSTOM PROCEDURE TRAY) ×2 IMPLANT
PROTECTION STATION PRESSURIZED (MISCELLANEOUS) ×2
SET ATX SIMPLICITY (MISCELLANEOUS) ×1 IMPLANT
STATION PROTECTION PRESSURIZED (MISCELLANEOUS) IMPLANT
STENT ONYX FRONTIER 3.5X22 (Permanent Stent) ×1 IMPLANT
WIRE HITORQ VERSACORE ST 145CM (WIRE) ×1 IMPLANT
WIRE RUNTHROUGH .014X180CM (WIRE) ×1 IMPLANT

## 2021-12-23 NOTE — ED Notes (Signed)
Dr. Saunders Revel, cardiologist at bedside.

## 2021-12-23 NOTE — ED Notes (Signed)
Pt room at this time.  MD Ward at bedside on arrival.

## 2021-12-23 NOTE — ED Notes (Signed)
Care link called [facility] to be activated

## 2021-12-23 NOTE — ED Triage Notes (Signed)
Pt to ED from home c/o left chest pain radiatin to left arm that started approx 1 hour ago when lying down for bed.  States pain is achy, denies n/v/d or SOB, denies blood thinners.

## 2021-12-23 NOTE — Progress Notes (Signed)
*  PRELIMINARY RESULTS* Echocardiogram 2D Echocardiogram has been performed.  [name] 12/23/2021, 10:33 AM
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
## 2021-12-23 NOTE — ED Notes (Signed)
Pt transported to cath lab by this writer at this time.

## 2021-12-23 NOTE — Assessment & Plan Note (Signed)
-   continue Flomax and Proscar.

## 2021-12-23 NOTE — Assessment & Plan Note (Signed)
-   We will continue vitamin B12. 

## 2021-12-23 NOTE — Progress Notes (Signed)
0715: Bedside shift report with PM RN. MD rounding on patient, informed of TR oozing/re inflation. 1cc air removed by PM RN (7cc air left in band).   0725: Patient assisted to reposition, per request. No oozing at TR band, patient with no complaints prior to this writer leaving room.   7290: Secure chat sent to MD re potassium 3.7 and critical troponin. Awaiting response.   0818: MD returned secure chat, no new orders.   0905: Patient assisted to recliner with minimal assist. Patient breakfast arrived, wife assisting patient with set up. Patient states he is comfortable in chair and has no other needs at this time.   1000: Patient and wife educated on brillinta and provided printout, as patient has difficulty hearing, verbalizes understanding. Echo tech bedside, and this Probation officer assisted patient back to bed without issues. TR band remains completely deflated with no new bleeding or drainage.   1105: Call placed to pharmacy re brillinta instructions and loading dose. Okay to begin administration now (as ordered on [facility]).   1155: Patient complaining of L side shoulder pain and states "it aint burning like yesterday! I cant tell you much other than it hurts." After asking more detailed questions, patient endorses some L sided CP/burning as well. PRN medications given, ECG sent to chart & secure chat sent to cardiology and hospitalist.   1200: When asked, patient states his shoulder is still hurting, but the burning in his chest is gone. PRN medication given (per [facility]). Patient educated on being aware worsening CP, verbalizes understanding.   1213: Response from cardiology via secure chat, no new orders.   1235: Patient requiring minimal assist to get out of bed to chair for lunch. No complaints or requests at this time.    1300: Patient family provided unit number as they "were transferred to the wrong person" this am when attempting to reach their family member. Patient vital signs stable, patient  without complaint at this time. Would like to remain in chair at this time. Educated on frequent repositioning and weight shifts, patient verbalizes understanding.  1445: Patient remains in chair per request, linens changed as patient spilled small amount of urine from urinal. Patient states he has no pain at this time and has no requests. Message sent to MD re potential transfer as patient has remained hemodynamically stable, orders placed.    1700: Patient remains in recliner without any complaints at this time. Offered assistance to return to bed and patient would like to stay in chair. Vital signs stable, patient and wife made aware of transfer orders placed/potential transfer.   1815: Patient requesting to return to bed. NT and RN assisted patient with CHG bath and back to bed with assistance for equipment. Patient in bed comfortable with no signs of distress and no requests at this time.   1910: Bedside shift report with PM RN patient TR band remains deflated with no new drainage and unchanged hematoma (viewed by RN x2 in bedside report). Patient with no complaints or concerns at this time.

## 2021-12-23 NOTE — TOC Benefit Eligibility Note (Signed)
Patient Teacher, English as a foreign language completed.    The patient is currently admitted and upon discharge could be taking Brilinta 90 mg.  The current 30 day co-pay is, $30.00.   The patient is insured through [place] [place] Patient Advocate Specialist [place] Patient Advocate Team Direct Number: [phone]  Fax: [phone]

## 2021-12-23 NOTE — Assessment & Plan Note (Signed)
--  s/p stent to LAD on 5/24 --Echocardiogram showing moderately reduced ejection fraction 35% with large region anterior wall hypokinesis -No significant arrhythmia on telemetry, though high risk of arrhythmia first 48 hours Plan: ---Continue aspirin, Brilinta twice daily, high intensity statin, beta-blocker, ARB --given size of anterior infarct, cardio recommends watching on telemetry another night

## 2021-12-23 NOTE — Progress Notes (Signed)
   12/23/21 0100  Clinical Encounter Type  Visited With Patient and family together  Visit Type Code  Consult/Referral To [facility] responded to Code Stemi and provided support to both patient and wife.
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
## 2021-12-24 DIAGNOSIS — I10 Essential (primary) hypertension: Secondary | ICD-10-CM | POA: Diagnosis not present

## 2021-12-24 DIAGNOSIS — I2102 ST elevation (STEMI) myocardial infarction involving left anterior descending coronary artery: Secondary | ICD-10-CM | POA: Diagnosis not present

## 2021-12-24 DIAGNOSIS — I255 Ischemic cardiomyopathy: Secondary | ICD-10-CM | POA: Diagnosis not present

## 2021-12-24 DIAGNOSIS — I5021 Acute systolic (congestive) heart failure: Secondary | ICD-10-CM

## 2021-12-24 DIAGNOSIS — E782 Mixed hyperlipidemia: Secondary | ICD-10-CM

## 2021-12-24 DIAGNOSIS — I5022 Chronic systolic (congestive) heart failure: Secondary | ICD-10-CM

## 2021-12-24 LAB — BASIC METABOLIC PANEL
Anion gap: 8 (ref 5–15)
BUN: 12 mg/dL (ref 8–23)
CO2: 24 mmol/L (ref 22–32)
Calcium: 8.9 mg/dL (ref 8.9–10.3)
Chloride: 107 mmol/L (ref 98–111)
Creatinine, Ser: 0.69 mg/dL (ref 0.61–1.24)
GFR, Estimated: >60 mL/min (ref >60)
Glucose, Bld: 113 mg/dL — ABNORMAL HIGH (ref 70–99)
Potassium: 3.6 mmol/L (ref 3.5–5.1)
Sodium: 139 mmol/L (ref 135–145)

## 2021-12-24 LAB — CBC
HCT: 40.7 % (ref 39.0–52.0)
Hemoglobin: 13.7 g/dL (ref 13.0–17.0)
MCH: 30.2 pg (ref 26.0–34.0)
MCHC: 33.7 g/dL (ref 30.0–36.0)
MCV: 89.6 fL (ref 80.0–100.0)
Platelets: 150 10*3/uL (ref 150–400)
RBC: 4.54 MIL/uL (ref 4.22–5.81)
RDW: 13.1 % (ref 11.5–15.5)
WBC: 7 10*3/uL (ref 4.0–10.5)
nRBC: 0 % (ref 0.0–0.2)

## 2021-12-24 LAB — LIPOPROTEIN A (LPA): Lipoprotein (a): 21.6 nmol/L (ref <75.0)

## 2021-12-24 LAB — TROPONIN I (HIGH SENSITIVITY): Troponin I (High Sensitivity): 22798 ng/L (ref <18)

## 2021-12-24 LAB — MAGNESIUM: Magnesium: 1.8 mg/dL (ref 1.7–2.4)

## 2021-12-24 MED ORDER — LOSARTAN POTASSIUM 25 MG PO TABS
25.0000 mg | ORAL_TABLET | Freq: Every day | ORAL | Status: DC
  Administered 2021-12-24 – 2021-12-25 (×2): 25 mg via ORAL
  Filled 2021-12-24 (×2): qty 1

## 2021-12-24 NOTE — Progress Notes (Signed)
  Progress Note   Patient: [name] GNF:[id number] DOB: Dec 15, 1934 DOA: 12/23/2021     1 DOS: the patient was seen and examined on 12/24/2021   Brief hospital course: No notes on file  Assessment and Plan: * STEMI involving left anterior descending coronary artery ([place]) --s/p stent to LAD on 5/24 --Echocardiogram showing moderately reduced ejection fraction 35% with large region anterior wall hypokinesis -No significant arrhythmia on telemetry, though high risk of arrhythmia first 48 hours Plan: ---Continue aspirin, Brilinta twice daily, high intensity statin, beta-blocker, ARB --given size of anterior infarct, cardio recommends watching on telemetry another night   BPH with obstruction/lower urinary tract symptoms - continue Flomax and Proscar.  Acute systolic CHF (congestive heart failure) (HCC) --Echocardiogram showing moderately reduced ejection fraction 35% with large region anterior wall hypokinesis, due to acute infarct Plan: --cont Toprol and losartan (new)  HTN (hypertension) --BP varied widely --monitor BP while starting Toprol and losartan        Subjective:  No new complaint today.  No chest pain.  Per cardio, needs another night of monitoring given the size of anterior infarct.   Physical Exam:  Constitutional: NAD, AAOx3 HEENT: conjunctivae and lids normal, EOMI CV: No cyanosis.   RESP: normal respiratory effort, on RA Neuro: II - XII grossly intact.     Data Reviewed:  Family Communication: daughter in-law updated at bedside today.  Disposition: Status is: Inpatient   Planned Discharge Destination: Home    Time spent: 50 minutes  Author: Enzo Bi, MD 12/24/2021 6:38 PM  For on call review [email or web address].

## 2021-12-24 NOTE — Progress Notes (Signed)
   12/24/21 0730  Clinical Encounter Type  Visited With Patient and family together  Visit Type Follow-up   Chaplain provided follow up support.

## 2021-12-24 NOTE — Plan of Care (Signed)
  Problem: Health Behavior/Discharge Planning: Goal: Ability to manage health-related needs will improve Outcome: Progressing   Problem: Clinical Measurements: Goal: Ability to maintain clinical measurements within normal limits will improve Outcome: Progressing   Problem: Pain Managment: Goal: General experience of comfort will improve Outcome: Progressing

## 2021-12-24 NOTE — Assessment & Plan Note (Signed)
--  BP varied widely --monitor BP while starting Toprol and losartan

## 2021-12-24 NOTE — Assessment & Plan Note (Signed)
--  Echocardiogram showing moderately reduced ejection fraction 35% with large region anterior wall hypokinesis, due to acute infarct Plan: --cont Toprol and losartan (new)

## 2021-12-24 NOTE — Progress Notes (Signed)
Progress Note  Patient Name: [name] Date of Encounter: 12/24/2021  [facility] Cardiologist: [facility]  Subjective   Reports feeling well, no complaints Denies chest pain or shortness of breath at rest, relatively uneventful evening Wife at the bedside Cardiac catheterization results discussed in detail  echocardiogram results discussed detailing depressed ejection fraction, anterior wall hypokinesis Repeat troponin this morning 22 798  Inpatient Medications    Scheduled Meds:  aspirin  81 mg Oral Daily   atorvastatin  40 mg Oral Daily   Chlorhexidine Gluconate Cloth  6 each Topical Daily   enoxaparin (LOVENOX) injection  40 mg Subcutaneous Q24H   finasteride  5 mg Oral Daily   losartan  25 mg Oral Daily   metoprolol succinate  25 mg Oral Daily   sodium chloride flush  3 mL Intravenous Q12H   tamsulosin  0.8 mg Oral Daily   ticagrelor  90 mg Oral BID   Continuous Infusions:  sodium chloride 20 mL/hr at 12/23/21 0040   sodium chloride     PRN Meds: sodium chloride, acetaminophen, nitroGLYCERIN, ondansetron (ZOFRAN) IV, sodium chloride flush   Vital Signs    Vitals:   12/24/21 0835 12/24/21 0900 12/24/21 1000 12/24/21 1100  BP: (!) 143/96 138/87 107/65 (!) 145/78  Pulse: 69 77 100 86  Resp: (!) 25 16 (!) 25 (!) 21  Temp: 98.1 F (36.7 C)     TempSrc: Oral     SpO2: 97% 97% 100% 98%  Weight:      Height:        Intake/Output Summary (Last 24 hours) at 12/24/2021 1123 Last data filed at 12/24/2021 0957 Gross per 24 hour  Intake 630 ml  Output 1850 ml  Net -1220 ml      12/23/2021    2:30 AM 12/23/2021   12:24 AM 11/10/2021    8:59 AM  Last 3 Weights  Weight (lbs) 186 lb 11.7 oz 182 lb 185 lb  Weight (kg) 84.7 kg 82.555 kg 83.915 kg      Telemetry    Normal sinus rhythm, PVCs- Personally Reviewed  ECG     - Personally Reviewed  Physical Exam   GEN: No acute distress.   Neck: No JVD Cardiac: RRR, no murmurs, rubs, or gallops.   Respiratory: Clear to auscultation bilaterally. GI: Soft, nontender, non-distended  MS: No edema; No deformity. Neuro:  Nonfocal  Psych: Normal affect   Labs    High Sensitivity Troponin:   Recent Labs  Lab 12/23/21 0028 12/23/21 0713 12/24/21 0853  TROPONINIHS 271* >24,000* 22,798*     Chemistry Recent Labs  Lab 12/23/21 0028 12/23/21 0713 12/24/21 0446  NA 139 136 139  K 3.4* 3.7 3.6  CL 106 106 107  CO2 27 23 24   GLUCOSE 199* 126* 113*  BUN 17 14 12   CREATININE 0.85 0.60* 0.69  CALCIUM 9.3 8.8* 8.9  MG  --   --  1.8  PROT 7.2  --   --   ALBUMIN 4.0  --   --   AST 32  --   --   ALT 14  --   --   ALKPHOS 98  --   --   BILITOT 0.6  --   --   GFRNONAA >60 >60 >60  ANIONGAP 6 7 8     Lipids  Recent Labs  Lab 12/23/21 0028  CHOL 159  TRIG 63  HDL 47  LDLCALC 99  CHOLHDL 3.4    Hematology Recent Labs  Lab 12/23/21 0028 12/23/21 0713 12/24/21 0446  WBC 6.0 6.3 7.0  RBC 4.63 4.38 4.54  HGB 13.9 13.1 13.7  HCT 42.9 39.2 40.7  MCV 92.7 89.5 89.6  MCH 30.0 29.9 30.2  MCHC 32.4 33.4 33.7  RDW 13.2 13.0 13.1  PLT 151 145* 150   Thyroid No results for input(s): TSH, FREET4 in the last 168 hours.  BNPNo results for input(s): BNP, PROBNP in the last 168 hours.  DDimer No results for input(s): DDIMER in the last 168 hours.   Radiology    CARDIAC CATHETERIZATION  Result Date: 12/23/2021 Conclusions: Severe single-vessel coronary artery disease with thrombotic 99% stenosis involving the proximal/mid LAD consistent with acute plaque rupture.  There is also a calcified 50-60% stenosis involving the mid LAD at the takeoff of D1. Moderate, nonobstructive coronary artery disease with 50-60% disease involving mid and distal portions of codominant RCA.  No significant disease is identified in the LCx. Successful PCI to proximal/mid LAD using Onyx Frontier 3.5 x 22 mm drug-eluting stent with 0% residual stenosis and TIMI-3 flow. Tortuous right subclavian/innominate  artery making catheter manipulation challenging.  Consider alternative access for future catheterizations. Recommendations: Dual antiplatelet therapy with aspirin and ticagrelor for at least 12 months. Aggressive secondary prevention of coronary artery disease, including high intensity statin therapy. Obtain echocardiogram to assess LVEF. Start metoprolol succinate 25 mg daily, with judicious escalation as tolerated. Cardiac rehab referral placed. Nelva Bush, MD [facility]  ECHOCARDIOGRAM COMPLETE  Result Date: 12/23/2021    ECHOCARDIOGRAM REPORT   Patient Name:   [name] Date of Exam: 12/23/2021 Medical Rec #:  [id number]      Height:       72.0 in Accession #:    [id number]     Weight:       186.7 lb Date of Birth:  12-Sep-1934      BSA:          2.069 m Patient Age:    87 years       BP:           152/77 mmHg Patient Gender: M              HR:           78 bpm. Exam Location:  [facility] Procedure: 2D Echo, Color Doppler, Cardiac Doppler, Intracardiac Opacification            Agent and Strain Analysis Indications:     I21.9 Acute myocardial infarction, unspecified  History:         Patient has no prior history of Echocardiogram examinations.                  DVT; Risk Factors:Hypertension.  Sonographer:     Charmayne Sheer Referring Phys:  [phone] CHRISTOPHER END Diagnosing Phys: Kate Sable MD  Sonographer Comments: No subcostal window. Global longitudinal strain was attempted. IMPRESSIONS  1. Left ventricular ejection fraction, by estimation, is 35 to 40%. The left ventricle has moderately decreased function. The left ventricle demonstrates regional wall motion abnormalities (see scoring diagram/findings for description). There is mild left ventricular hypertrophy. Left ventricular diastolic parameters are consistent with Grade II diastolic dysfunction (pseudonormalization). There is akinesis of the left ventricular, mid-apical anteroseptal wall and apical segment. The average left ventricular global  longitudinal strain is -10.4 %. The global longitudinal strain is abnormal.  2. Right ventricular systolic function is low normal. The right ventricular size is normal.  3. The mitral valve is normal in  structure. Mild mitral valve regurgitation.  4. The aortic valve is tricuspid. Aortic valve regurgitation is not visualized. FINDINGS  Left Ventricle: Left ventricular ejection fraction, by estimation, is 35 to 40%. The left ventricle has moderately decreased function. The left ventricle demonstrates regional wall motion abnormalities. Definity contrast agent was given IV to delineate the left ventricular endocardial borders. The average left ventricular global longitudinal strain is -10.4 %. The global longitudinal strain is abnormal. The left ventricular internal cavity size was normal in size. There is mild left ventricular hypertrophy. Left ventricular diastolic parameters are consistent with Grade II diastolic dysfunction (pseudonormalization). Right Ventricle: The right ventricular size is normal. No increase in right ventricular wall thickness. Right ventricular systolic function is low normal. Left Atrium: Left atrial size was normal in size. Right Atrium: Right atrial size was normal in size. Pericardium: There is no evidence of pericardial effusion. Mitral Valve: The mitral valve is normal in structure. Mild mitral valve regurgitation. MV peak gradient, 5.5 mmHg. The mean mitral valve gradient is 2.0 mmHg. Tricuspid Valve: The tricuspid valve is not well visualized. Tricuspid valve regurgitation is not demonstrated. Aortic Valve: The aortic valve is tricuspid. Aortic valve regurgitation is not visualized. Aortic valve mean gradient measures 2.0 mmHg. Aortic valve peak gradient measures 4.0 mmHg. Aortic valve area, by VTI measures 4.45 cm. Pulmonic Valve: The pulmonic valve was not well visualized. Pulmonic valve regurgitation is not visualized. Aorta: The aortic root is normal in size and structure.  Venous: The inferior vena cava was not well visualized. IAS/Shunts: No atrial level shunt detected by color flow Doppler.  LEFT VENTRICLE PLAX 2D LVIDd:         4.19 cm      Diastology LVIDs:         3.30 cm      LV e' medial:    4.57 cm/s LV PW:         1.48 cm      LV E/e' medial:  24.1 LV IVS:        1.12 cm      LV e' lateral:   5.33 cm/s LVOT diam:     2.20 cm      LV E/e' lateral: 20.7 LV SV:         73 LV SV Index:   35           2D Longitudinal Strain LVOT Area:     3.80 cm     2D Strain GLS Avg:     -10.4 %  LV Volumes (MOD) LV vol d, MOD A2C: 122.0 ml LV vol d, MOD A4C: 133.0 ml LV vol s, MOD A2C: 71.0 ml LV vol s, MOD A4C: 80.7 ml LV SV MOD A2C:     51.0 ml LV SV MOD A4C:     133.0 ml LV SV MOD BP:      53.1 ml RIGHT VENTRICLE RV Basal diam:  2.51 cm RV S prime:     10.90 cm/s LEFT ATRIUM             Index        RIGHT ATRIUM          Index LA diam:        3.80 cm 1.84 cm/m   RA Area:     9.80 cm LA Vol (A2C):   38.7 ml 18.70 ml/m  RA Volume:   17.80 ml 8.60 ml/m LA Vol (A4C):   44.3 ml 21.41 ml/m LA Biplane Vol:  43.8 ml 21.17 ml/m  AORTIC VALVE                    PULMONIC VALVE AV Area (Vmax):    3.84 cm     PV Vmax:       0.67 m/s AV Area (Vmean):   3.72 cm     PV Vmean:      45.600 cm/s AV Area (VTI):     4.45 cm     PV VTI:        0.127 m AV Vmax:           100.00 cm/s  PV Peak grad:  1.8 mmHg AV Vmean:          71.200 cm/s  PV Mean grad:  1.0 mmHg AV VTI:            0.163 m AV Peak Grad:      4.0 mmHg AV Mean Grad:      2.0 mmHg LVOT Vmax:         101.00 cm/s LVOT Vmean:        69.600 cm/s LVOT VTI:          0.191 m LVOT/AV VTI ratio: 1.17  AORTA Ao Root diam: 3.50 cm MITRAL VALVE MV Area (PHT): 6.94 cm     SHUNTS MV Area VTI:   4.27 cm     Systemic VTI:  0.19 m MV Peak grad:  5.5 mmHg     Systemic Diam: 2.20 cm MV Mean grad:  2.0 mmHg MV Vmax:       1.17 m/s MV Vmean:      62.0 cm/s MV Decel Time: 109 msec MV E velocity: 110.33 cm/s MV A velocity: 44.07 cm/s MV E/A ratio:  2.50 Kate Sable MD Electronically signed by Kate Sable MD Signature Date/Time: 12/23/2021/1:53:30 PM    Final     Cardiac Studies   Echocardiogram  1. Left ventricular ejection fraction, by estimation, is 35 to 40%. The  left ventricle has moderately decreased function. The left ventricle  demonstrates regional wall motion abnormalities (see scoring  diagram/findings for description). There is mild  left ventricular hypertrophy. Left ventricular diastolic parameters are  consistent with Grade II diastolic dysfunction (pseudonormalization).  There is akinesis of the left ventricular, mid-apical anteroseptal wall  and apical segment. The average left  ventricular global longitudinal strain is -10.4 %. The global longitudinal  strain is abnormal.   2. Right ventricular systolic function is low normal. The right  ventricular size is normal.   3. The mitral valve is normal in structure. Mild mitral valve  regurgitation.   4. The aortic valve is tricuspid. Aortic valve regurgitation is not  visualized.   Patient Profile     MACEDONIO SCALLON is a 87 y.o. male with a hx of hypertension, DVT, and BPH, who is being seen 12/23/2021 for the evaluation of chest pain/STEMI with stent placed to LAD  Assessment & Plan   Anterior wall STEMI Presenting 1 AM Dec 23, 2021 with anginal pain Taken to the catheterization lab, procedure details:  thrombotic lesion in the proximal/mid LAD was predilated with a Trek 2.5 x 12 mm balloon at 8-12 atm. The lesion was then covered with an Onyx Frontier 3.5 x 22 mm drug-eluting stent,  -Continue aspirin, Brilinta twice daily, high intensity statin, beta-blocker, ARB -Echocardiogram showing moderately reduced ejection fraction 35% with large region anterior wall hypokinesis -No significant arrhythmia on telemetry -Case discussed with interventional cardiology,  given size of anterior infarct, would recommend watching on telemetry overnight -High risk of arrhythmia  first 48 hours  Hyperlipidemia Continue high intensity statin Lipitor 80 daily, goal LDL 55 or less  Ischemic cardiomyopathy Ejection fraction following intervention 35% with anterior wall hypokinesis Does not meet threshold low ejection fraction for ZOll vest Started on beta-blocker, ARB  Essential hypertension Continue metoprolol succinate 25 daily, losartan 25 daily  Case discussed with nursing, hospitalist service, interventional cardiology  Total encounter time more than 50 minutes  Greater than 50% was spent in counseling and coordination of care with the patient   For questions or updates, please contact [place] Please consult [email or web address] for contact info under        Signed, Ida Rogue, MD  12/24/2021, 11:23 AM

## 2021-12-25 ENCOUNTER — Other Ambulatory Visit: Payer: Self-pay

## 2021-12-25 DIAGNOSIS — I2102 ST elevation (STEMI) myocardial infarction involving left anterior descending coronary artery: Secondary | ICD-10-CM | POA: Diagnosis not present

## 2021-12-25 DIAGNOSIS — I255 Ischemic cardiomyopathy: Secondary | ICD-10-CM | POA: Diagnosis not present

## 2021-12-25 DIAGNOSIS — I25118 Atherosclerotic heart disease of native coronary artery with other forms of angina pectoris: Secondary | ICD-10-CM

## 2021-12-25 LAB — BASIC METABOLIC PANEL
Anion gap: 8 (ref 5–15)
BUN: 20 mg/dL (ref 8–23)
CO2: 25 mmol/L (ref 22–32)
Calcium: 9 mg/dL (ref 8.9–10.3)
Chloride: 104 mmol/L (ref 98–111)
Creatinine, Ser: 0.83 mg/dL (ref 0.61–1.24)
GFR, Estimated: >60 mL/min (ref >60)
Glucose, Bld: 104 mg/dL — ABNORMAL HIGH (ref 70–99)
Potassium: 3.8 mmol/L (ref 3.5–5.1)
Sodium: 137 mmol/L (ref 135–145)

## 2021-12-25 LAB — CBC
HCT: 38.7 % — ABNORMAL LOW (ref 39.0–52.0)
Hemoglobin: 12.9 g/dL — ABNORMAL LOW (ref 13.0–17.0)
MCH: 30.1 pg (ref 26.0–34.0)
MCHC: 33.3 g/dL (ref 30.0–36.0)
MCV: 90.4 fL (ref 80.0–100.0)
Platelets: 151 10*3/uL (ref 150–400)
RBC: 4.28 MIL/uL (ref 4.22–5.81)
RDW: 13.3 % (ref 11.5–15.5)
WBC: 6.2 10*3/uL (ref 4.0–10.5)
nRBC: 0 % (ref 0.0–0.2)

## 2021-12-25 LAB — MAGNESIUM: Magnesium: 1.9 mg/dL (ref 1.7–2.4)

## 2021-12-25 MED ORDER — TICAGRELOR 90 MG PO TABS
90.0000 mg | ORAL_TABLET | Freq: Two times a day (BID) | ORAL | 0 refills | Status: DC
  Filled 2021-12-25: qty 60, 30d supply, fill #0

## 2021-12-25 MED ORDER — TICAGRELOR 90 MG PO TABS: 90.0000 mg | ORAL_TABLET | Freq: Two times a day (BID) | ORAL | 11 refills | Status: DC

## 2021-12-25 MED ORDER — ASPIRIN 81 MG PO CHEW: 81.0000 mg | CHEWABLE_TABLET | Freq: Every day | ORAL | 11 refills | Status: DC

## 2021-12-25 MED ORDER — ATORVASTATIN CALCIUM 40 MG PO TABS: 40.0000 mg | ORAL_TABLET | Freq: Every day | ORAL | 2 refills | Status: DC

## 2021-12-25 MED ORDER — LOSARTAN POTASSIUM 25 MG PO TABS
25.0000 mg | ORAL_TABLET | Freq: Every day | ORAL | 0 refills | Status: DC
  Filled 2021-12-25: qty 30, 30d supply, fill #0

## 2021-12-25 MED ORDER — ASPIRIN 81 MG PO CHEW
81.0000 mg | CHEWABLE_TABLET | Freq: Every day | ORAL | 0 refills | Status: DC
Start: 2021-12-26 — End: 2021-12-25
  Filled 2021-12-25: qty 30, 30d supply, fill #0

## 2021-12-25 MED ORDER — TICAGRELOR 90 MG PO TABS: 90.0000 mg | ORAL_TABLET | Freq: Two times a day (BID) | ORAL | 10 refills | Status: DC

## 2021-12-25 MED ORDER — METOPROLOL SUCCINATE ER 25 MG PO TB24
25.0000 mg | ORAL_TABLET | Freq: Every day | ORAL | 0 refills | Status: DC
  Filled 2021-12-25: qty 30, 30d supply, fill #0

## 2021-12-25 MED ORDER — METOPROLOL SUCCINATE ER 25 MG PO TB24
25.0000 mg | ORAL_TABLET | Freq: Every day | ORAL | 2 refills | Status: DC
Start: 2021-12-26 — End: 2021-12-29

## 2021-12-25 MED ORDER — LOSARTAN POTASSIUM 25 MG PO TABS
25.0000 mg | ORAL_TABLET | Freq: Every day | ORAL | 2 refills | Status: DC
Start: 2021-12-26 — End: 2022-04-12

## 2021-12-25 MED ORDER — ATORVASTATIN CALCIUM 40 MG PO TABS
40.0000 mg | ORAL_TABLET | Freq: Every day | ORAL | 0 refills | Status: DC
  Filled 2021-12-25: qty 30, 30d supply, fill #0

## 2021-12-25 NOTE — TOC Initial Note (Signed)
Transition of Care [facility]) - Initial/Assessment Note    Patient Details  Name: [name] MRN: [id number] Date of Birth: 10-25-1934  [facility] of [facility]) CM/SW Contact:    Laurena Slimmer, RN Phone Number: 12/25/2021, 10:36 AM  Clinical Narrative:                  Transition of Care [facility]) Screening Note   Patient Details  Name: [name] Date of Birth: 11/21/1934   Transition of Care [facility]) CM/SW Contact:    Laurena Slimmer, RN Phone Number: 12/25/2021, 10:36 AM    Transition of Care Department [facility]) has reviewed patient and no TOC needs have been identified at this time. We will continue to monitor patient advancement through interdisciplinary progression rounds. If new patient transition needs arise, please place a TOC consult.          Patient Goals and CMS Choice        Expected Discharge Plan and Services                                                Prior Living Arrangements/Services                       Activities of Daily Living Home Assistive Devices/Equipment: Blood pressure cuff, Cane (specify quad or straight) ADL Screening (condition at time of admission) Patient's cognitive ability adequate to safely complete daily activities?: Yes Is the patient deaf or have difficulty hearing?: Yes Does the patient have difficulty seeing, even when wearing glasses/contacts?: No Does the patient have difficulty concentrating, remembering, or making decisions?: No Patient able to express need for assistance with ADLs?: Yes Does the patient have difficulty dressing or bathing?: No Independently performs ADLs?: Yes (appropriate for developmental age) Does the patient have difficulty walking or climbing stairs?: Yes Weakness of Legs: Both Weakness of Arms/Hands: None  Permission Sought/Granted                  Emotional Assessment              Admission diagnosis:  ST elevation myocardial infarction (STEMI),  unspecified artery ([place]) [I21.3] STEMI involving left anterior descending coronary artery ([place]) [I21.02] STEMI (ST elevation myocardial infarction) ([place]) [I21.3] Patient Active Problem List   Diagnosis Date Noted   Acute systolic CHF (congestive heart failure) ([place]) 12/24/2021   STEMI involving left anterior descending coronary artery ([place]) 12/23/2021   STEMI (ST elevation myocardial infarction) ([place]) 12/23/2021   ST elevation myocardial infarction involving left anterior descending (LAD) coronary artery ([place])    Constipation 10/16/2018   Polymyalgia rheumatica ([place]) 07/04/2015   Advance directive discussed with patient 08/21/2014   Routine general medical examination at a health care facility 08/11/2011   BPH with obstruction/lower urinary tract symptoms 07/11/2007   HEARING LOSS 01/04/2007   ANEMIA, PERNICIOUS 11/02/2006   HTN (hypertension) 11/02/2006   Osteoarthritis, multiple sites 11/02/2006   DVT, HX OF 11/02/2006   PCP:  Venia Carbon, MD Pharmacy:   [facility] ORDER) [place], [place] [address] Phone: [phone] Fax: [place], [place] [address] Phone: [phone] Fax: [phone]  [place]  [place] Phone: [phone] Fax: [phone]     Social Determinants of Health (SDOH) Interventions    Readmission Risk Interventions     View : No data to display.

## 2021-12-25 NOTE — Progress Notes (Signed)
Progress Note  Patient Name: [name] Date of Encounter: 12/25/2021  Primary Cardiologist: New to [facility] - consult by End  Subjective   Transferred out of the ICU to the floor yesterday. No chest pain, dyspnea, palpitations, or dizziness. Vitals stable. Some mild bruising noted along the right radial arteriotomy site without swelling or pain.   Inpatient Medications    Scheduled Meds:  aspirin  81 mg Oral Daily   atorvastatin  40 mg Oral Daily   Chlorhexidine Gluconate Cloth  6 each Topical Daily   enoxaparin (LOVENOX) injection  40 mg Subcutaneous Q24H   finasteride  5 mg Oral Daily   losartan  25 mg Oral Daily   metoprolol succinate  25 mg Oral Daily   sodium chloride flush  3 mL Intravenous Q12H   tamsulosin  0.8 mg Oral Daily   ticagrelor  90 mg Oral BID   Continuous Infusions:  sodium chloride 20 mL/hr at 12/23/21 0040   sodium chloride     PRN Meds: sodium chloride, acetaminophen, nitroGLYCERIN, ondansetron (ZOFRAN) IV, sodium chloride flush   Vital Signs    Vitals:   12/24/21 2100 12/24/21 2343 12/25/21 0321 12/25/21 0745  BP: 119/71 114/62 102/61 107/69  Pulse: (!) 56 60 64 62  Resp: '18 16 19 20  '$ Temp: 98.5 F (36.9 C) 98.2 F (36.8 C) 97.6 F (36.4 C) 98.3 F (36.8 C)  TempSrc:   Oral Oral  SpO2: 96% 98% 96% 94%  Weight:      Height:        Intake/Output Summary (Last 24 hours) at 12/25/2021 0824 Last data filed at 12/25/2021 0321 Gross per 24 hour  Intake 603 ml  Output 1100 ml  Net -497 ml   Filed Weights   12/23/21 0024 12/23/21 0230  Weight: 82.6 kg 84.7 kg    Telemetry    SR with sinus bradycardia with rare isolated PVCs - Personally Reviewed  ECG    No new tracings - Personally Reviewed  Physical Exam   GEN: No acute distress.   Neck: No JVD. Cardiac: RRR, no murmurs, rubs, or gallops. Right radial arteriotomy site is without active bleeding, swelling, warmth, erythema, bruit, or TTP. Mild soft bruising noted proximal to  the arteriotomy site. Radial pulse is 2+ proximal and distal to the arteriotomy site.  Respiratory: Clear to auscultation bilaterally.  GI: Soft, nontender, non-distended.   MS: No edema; No deformity. Neuro:  Alert and oriented x 3; Nonfocal.  Psych: Normal affect.  Labs    Chemistry Recent Labs  Lab 12/23/21 0028 12/23/21 0713 12/24/21 0446 12/25/21 0556  NA 139 136 139 137  K 3.4* 3.7 3.6 3.8  CL 106 106 107 104  CO2 '27 23 24 25  '$ GLUCOSE 199* 126* 113* 104*  BUN '17 14 12 20  '$ CREATININE 0.85 0.60* 0.69 0.83  CALCIUM 9.3 8.8* 8.9 9.0  PROT 7.2  --   --   --   ALBUMIN 4.0  --   --   --   AST 32  --   --   --   ALT 14  --   --   --   ALKPHOS 98  --   --   --   BILITOT 0.6  --   --   --   GFRNONAA >60 >60 >60 >60  ANIONGAP '6 7 8 8     '$ Hematology Recent Labs  Lab 12/23/21 0713 12/24/21 0446 12/25/21 0556  WBC 6.3 7.0 6.2  RBC 4.38 4.54 4.28  HGB 13.1 13.7 12.9*  HCT 39.2 40.7 38.7*  MCV 89.5 89.6 90.4  MCH 29.9 30.2 30.1  MCHC 33.4 33.7 33.3  RDW 13.0 13.1 13.3  PLT 145* 150 151    Cardiac EnzymesNo results for input(s): TROPONINI in the last 168 hours. No results for input(s): TROPIPOC in the last 168 hours.   BNPNo results for input(s): BNP, PROBNP in the last 168 hours.   DDimer No results for input(s): DDIMER in the last 168 hours.   Radiology     Cardiac Studies   [facility]HC 12/23/2021: Conclusions: Severe single-vessel coronary artery disease with thrombotic 99% stenosis involving the proximal/mid LAD consistent with acute plaque rupture.  There is also a calcified 50-60% stenosis involving the mid LAD at the takeoff of D1. Moderate, nonobstructive coronary artery disease with 50-60% disease involving mid and distal portions of codominant RCA.  No significant disease is identified in the LCx. Successful PCI to proximal/mid LAD using Onyx Frontier 3.5 x 22 mm drug-eluting stent with 0% residual stenosis and TIMI-3 flow. Tortuous right subclavian/innominate  artery making catheter manipulation challenging.  Consider alternative access for future catheterizations.   Recommendations: Dual antiplatelet therapy with aspirin and ticagrelor for at least 12 months. Aggressive secondary prevention of coronary artery disease, including high intensity statin therapy. Obtain echocardiogram to assess LVEF. Start metoprolol succinate 25 mg daily, with judicious escalation as tolerated. Cardiac rehab referral placed. __________  2D echo 12/23/2021: 1. Left ventricular ejection fraction, by estimation, is 35 to 40%. The  left ventricle has moderately decreased function. The left ventricle  demonstrates regional wall motion abnormalities (see scoring  diagram/findings for description). There is mild  left ventricular hypertrophy. Left ventricular diastolic parameters are  consistent with Grade II diastolic dysfunction (pseudonormalization).  There is akinesis of the left ventricular, mid-apical anteroseptal wall  and apical segment. The average left  ventricular global longitudinal strain is -10.4 %. The global longitudinal  strain is abnormal.   2. Right ventricular systolic function is low normal. The right  ventricular size is normal.   3. The mitral valve is normal in structure. Mild mitral valve  regurgitation.   4. The aortic valve is tricuspid. Aortic valve regurgitation is not  visualized.   Patient Profile     87 y.o. male with history of DVT, HTN, and BPH admitted with an anterior ST elevation MI status post PCI/DES to the proximal/mid LAD and ICM.  Assessment & Plan    1. Anterior STEMI: -Status post PCI/DES to the proximal/mid LAD as outlined above -Continue DAPT with aspirin 81 mg daily and Brilinta 90 mg bid for at least 12 months without interruption in therapy, importance was discussed -Aggressive risk factor modification and secondary prevention including continuation of Toprol XL, losartan, and Lipitor -He does not qualify for  LifeVest with an EF of 40% and no significant sustained ventricular ectopy noted on telemetry  -Post cath instructions -Cardiac rehab -Ambulate in the hallway prior to discharge -We will arrange for follow up in our office within 2 weeks  2. Acute HFrEF secondary ICM with anterior ST elevation MI: -Appears euvolemic  -Continue losartan and Toprol XL -Further escalation of GDMT has been limited by relative hypotension -In the office, look to escalate GDMT as tolerated with transition from ARB to ARNI along with addition of MRA and SGLT2 inhibitor  -Look to repeat a limited echo in ~ 3 months following PCI and optimization of GDMT to reevaluate LVSF -CHF education  3. HLD: -LDL 99 with goal being at least < 55 -LPa normal -Lipitor -Outpatient FLP and LFT in 2 months with recommendation to escalate lipid therapy as indicated to achieve target LDL      For questions or updates, please contact [facility] Please consult [email or web address] for contact info under Cardiology/STEMI.    Signed, Christell Faith, PA-C [place] Pager: [phone] 12/25/2021, 8:24 AM

## 2021-12-25 NOTE — Discharge Summary (Signed)
Physician Discharge Summary   [name]  male DOB: [birth year]  OMV:[id number]  PCP: Venia Carbon, MD  Admit date: 12/23/2021 Discharge date: 12/25/2021  Admitted From: home Disposition:  home 1 month of all new Rx were filled and brought to pt prior to discharge.  Rest of the refills were sent to pt's outpatient mail pharmacy. CODE STATUS: Full code  Discharge Instructions     AMB Referral to Cardiac Rehabilitation - Phase II   Complete by: As directed    Diagnosis:  Coronary Stents STEMI     After initial evaluation and assessments completed: Virtual Based Care may be provided alone or in conjunction with Phase 2 Cardiac Rehab based on patient barriers.: Yes   Diet - low sodium heart healthy   Complete by: As directed    Discharge instructions   Complete by: As directed    You have had a heart attack and received a stent.  As we discussed, you need to take aspirin 81 mg and Brilinta together for 1 year.  You are also prescribed new cardiac medications to improve your heart function.  Please take them as directed.   Dr. Enzo Bi [facility] Course:  For full details, please see H&P, progress notes, consult notes and ancillary notes.  Briefly,  [name] is a 87 y.o. male with medical history significant for BPH, DVT, hypertension, osteoarthritis and pernicious anemia, who presented to the emergency room with acute onset of chest pain with radiation to the left jaw and left arm and graded 10/10 in severity and was found to have anterior STEMI for which pt was taken to the Cath Lab and underwent PCI by Dr. Saunders Revel with stent placement in the proximal to mid LAD for a 99% lesion.    * STEMI involving left anterior descending coronary artery (HCC) --s/p stent to LAD on 5/24 --Echocardiogram showing moderately reduced ejection fraction 35% with large region anterior wall hypokinesis --Monitored for >48 hours, no significant arrhythmia on telemetry. --Pt will  take ASA and Brilinta together for 1 year. --pt was started on Lipitor, Toprol and losartan.   BPH with obstruction/lower urinary tract symptoms - continue home Flomax and Proscar.   Acute systolic CHF (congestive heart failure) (HCC) --Echocardiogram showing moderately reduced ejection fraction 35% with large region anterior wall hypokinesis, due to acute infarct --pt was started on Toprol and losartan.   HTN (hypertension) --BP varied widely --pt was started on Toprol and losartan.   Discharge Diagnoses:  Principal Problem:   STEMI involving left anterior descending coronary artery (HCC) Active Problems:   BPH with obstruction/lower urinary tract symptoms   HTN (hypertension)   Acute systolic CHF (congestive heart failure) ([place])   30 Day Unplanned Readmission Risk Score    Flowsheet Row ED to Hosp-Admission (Current) from 12/23/2021 in [place] [facility]  30 Day Unplanned Readmission Risk Score (%) 11.13 Filed at 12/25/2021 0801       This score is the patient's risk of an unplanned readmission within 30 days of being discharged (0 -100%). The score is based on dignosis, age, lab data, medications, orders, and past utilization.   Low:  0-14.9   Medium: 15-21.9   High: 22-29.9   Extreme: 30 and above         Discharge Instructions:  Allergies as of 12/25/2021   No Known Allergies      Medication List     STOP taking these  medications    predniSONE 5 MG tablet Commonly known as: DELTASONE       TAKE these medications    aspirin 81 MG chewable tablet Chew 1 tablet (81 mg total) by mouth daily. Start taking on: Dec 26, 2021   atorvastatin 40 MG tablet Commonly known as: LIPITOR Take 1 tablet (40 mg total) by mouth daily. Start taking on: Dec 26, 2021   B-12 2500 MCG Tabs Take 1 tablet by mouth daily.   CENTRUM PO Take 1 tablet by mouth daily.   finasteride 5 MG tablet Commonly known as: PROSCAR TAKE 1 TABLET BY MOUTH DAILY.  GENERIC EQUIVALENT FOR PROSCAR   losartan 25 MG tablet Commonly known as: COZAAR Take 1 tablet (25 mg total) by mouth daily. Start taking on: Dec 26, 2021   metoprolol succinate 25 MG 24 hr tablet Commonly known as: TOPROL-XL Take 1 tablet (25 mg total) by mouth daily. Start taking on: Dec 26, 2021   tamsulosin 0.4 MG Caps capsule Commonly known as: FLOMAX Take 2 capsules (0.8 mg total) by mouth daily.   ticagrelor 90 MG Tabs tablet Commonly known as: Brilinta Take 1 tablet (90 mg total) by mouth 2 (two) times daily.         Follow-up Information     [name] I, MD Follow up in 1 week(s).   Specialties: Internal Medicine, Pediatrics Contact information: [place] [address] [phone]                 No Known Allergies   The results of significant diagnostics from this hospitalization (including imaging, microbiology, ancillary and laboratory) are listed below for reference.   Consultations:   Procedures/Studies: CARDIAC CATHETERIZATION  Result Date: 12/23/2021 Conclusions: Severe single-vessel coronary artery disease with thrombotic 99% stenosis involving the proximal/mid LAD consistent with acute plaque rupture.  There is also a calcified 50-60% stenosis involving the mid LAD at the takeoff of D1. Moderate, nonobstructive coronary artery disease with 50-60% disease involving mid and distal portions of codominant RCA.  No significant disease is identified in the LCx. Successful PCI to proximal/mid LAD using Onyx Frontier 3.5 x 22 mm drug-eluting stent with 0% residual stenosis and TIMI-3 flow. Tortuous right subclavian/innominate artery making catheter manipulation challenging.  Consider alternative access for future catheterizations. Recommendations: Dual antiplatelet therapy with aspirin and ticagrelor for at least 12 months. Aggressive secondary prevention of coronary artery disease, including high intensity statin therapy.  Obtain echocardiogram to assess LVEF. Start metoprolol succinate 25 mg daily, with judicious escalation as tolerated. Cardiac rehab referral placed. Nelva Bush, MD [facility]  ECHOCARDIOGRAM COMPLETE  Result Date: 12/23/2021    ECHOCARDIOGRAM REPORT   Patient Name:   [name] Date of Exam: 12/23/2021 Medical Rec #:  [id number]      Height:       72.0 in Accession #:    [id number]     Weight:       186.7 lb Date of Birth:  1934-10-21      BSA:          2.069 m Patient Age:    61 years       BP:           152/77 mmHg Patient Gender: M              HR:           78 bpm. Exam Location:  [facility] Procedure: 2D Echo, Color Doppler, Cardiac Doppler, Intracardiac  Opacification            Agent and Strain Analysis Indications:     I21.9 Acute myocardial infarction, unspecified  History:         Patient has no prior history of Echocardiogram examinations.                  DVT; Risk Factors:Hypertension.  Sonographer:     Charmayne Sheer Referring Phys:  [phone] CHRISTOPHER END Diagnosing Phys: Kate Sable MD  Sonographer Comments: No subcostal window. Global longitudinal strain was attempted. IMPRESSIONS  1. Left ventricular ejection fraction, by estimation, is 35 to 40%. The left ventricle has moderately decreased function. The left ventricle demonstrates regional wall motion abnormalities (see scoring diagram/findings for description). There is mild left ventricular hypertrophy. Left ventricular diastolic parameters are consistent with Grade II diastolic dysfunction (pseudonormalization). There is akinesis of the left ventricular, mid-apical anteroseptal wall and apical segment. The average left ventricular global longitudinal strain is -10.4 %. The global longitudinal strain is abnormal.  2. Right ventricular systolic function is low normal. The right ventricular size is normal.  3. The mitral valve is normal in structure. Mild mitral valve regurgitation.  4. The aortic valve is tricuspid. Aortic valve  regurgitation is not visualized. FINDINGS  Left Ventricle: Left ventricular ejection fraction, by estimation, is 35 to 40%. The left ventricle has moderately decreased function. The left ventricle demonstrates regional wall motion abnormalities. Definity contrast agent was given IV to delineate the left ventricular endocardial borders. The average left ventricular global longitudinal strain is -10.4 %. The global longitudinal strain is abnormal. The left ventricular internal cavity size was normal in size. There is mild left ventricular hypertrophy. Left ventricular diastolic parameters are consistent with Grade II diastolic dysfunction (pseudonormalization). Right Ventricle: The right ventricular size is normal. No increase in right ventricular wall thickness. Right ventricular systolic function is low normal. Left Atrium: Left atrial size was normal in size. Right Atrium: Right atrial size was normal in size. Pericardium: There is no evidence of pericardial effusion. Mitral Valve: The mitral valve is normal in structure. Mild mitral valve regurgitation. MV peak gradient, 5.5 mmHg. The mean mitral valve gradient is 2.0 mmHg. Tricuspid Valve: The tricuspid valve is not well visualized. Tricuspid valve regurgitation is not demonstrated. Aortic Valve: The aortic valve is tricuspid. Aortic valve regurgitation is not visualized. Aortic valve mean gradient measures 2.0 mmHg. Aortic valve peak gradient measures 4.0 mmHg. Aortic valve area, by VTI measures 4.45 cm. Pulmonic Valve: The pulmonic valve was not well visualized. Pulmonic valve regurgitation is not visualized. Aorta: The aortic root is normal in size and structure. Venous: The inferior vena cava was not well visualized. IAS/Shunts: No atrial level shunt detected by color flow Doppler.  LEFT VENTRICLE PLAX 2D LVIDd:         4.19 cm      Diastology LVIDs:         3.30 cm      LV e' medial:    4.57 cm/s LV PW:         1.48 cm      LV E/e' medial:  24.1 LV IVS:         1.12 cm      LV e' lateral:   5.33 cm/s LVOT diam:     2.20 cm      LV E/e' lateral: 20.7 LV SV:         73 LV SV Index:   35  2D Longitudinal Strain LVOT Area:     3.80 cm     2D Strain GLS Avg:     -10.4 %  LV Volumes (MOD) LV vol d, MOD A2C: 122.0 ml LV vol d, MOD A4C: 133.0 ml LV vol s, MOD A2C: 71.0 ml LV vol s, MOD A4C: 80.7 ml LV SV MOD A2C:     51.0 ml LV SV MOD A4C:     133.0 ml LV SV MOD BP:      53.1 ml RIGHT VENTRICLE RV Basal diam:  2.51 cm RV S prime:     10.90 cm/s LEFT ATRIUM             Index        RIGHT ATRIUM          Index LA diam:        3.80 cm 1.84 cm/m   RA Area:     9.80 cm LA Vol (A2C):   38.7 ml 18.70 ml/m  RA Volume:   17.80 ml 8.60 ml/m LA Vol (A4C):   44.3 ml 21.41 ml/m LA Biplane Vol: 43.8 ml 21.17 ml/m  AORTIC VALVE                    PULMONIC VALVE AV Area (Vmax):    3.84 cm     PV Vmax:       0.67 m/s AV Area (Vmean):   3.72 cm     PV Vmean:      45.600 cm/s AV Area (VTI):     4.45 cm     PV VTI:        0.127 m AV Vmax:           100.00 cm/s  PV Peak grad:  1.8 mmHg AV Vmean:          71.200 cm/s  PV Mean grad:  1.0 mmHg AV VTI:            0.163 m AV Peak Grad:      4.0 mmHg AV Mean Grad:      2.0 mmHg LVOT Vmax:         101.00 cm/s LVOT Vmean:        69.600 cm/s LVOT VTI:          0.191 m LVOT/AV VTI ratio: 1.17  AORTA Ao Root diam: 3.50 cm MITRAL VALVE MV Area (PHT): 6.94 cm     SHUNTS MV Area VTI:   4.27 cm     Systemic VTI:  0.19 m MV Peak grad:  5.5 mmHg     Systemic Diam: 2.20 cm MV Mean grad:  2.0 mmHg MV Vmax:       1.17 m/s MV Vmean:      62.0 cm/s MV Decel Time: 109 msec MV E velocity: 110.33 cm/s MV A velocity: 44.07 cm/s MV E/A ratio:  2.50 Kate Sable MD Electronically signed by Kate Sable MD Signature Date/Time: 12/23/2021/1:53:30 PM    Final       Labs: BNP (last 3 results) No results for input(s): BNP in the last 8760 hours. Basic Metabolic Panel: Recent Labs  Lab 12/23/21 0028 12/23/21 0713 12/24/21 0446  12/25/21 0556  NA 139 136 139 137  K 3.4* 3.7 3.6 3.8  CL 106 106 107 104  CO2 '27 23 24 25  '$ GLUCOSE 199* 126* 113* 104*  BUN '17 14 12 20  '$ CREATININE 0.85 0.60* 0.69 0.83  CALCIUM 9.3 8.8* 8.9 9.0  MG  --   --  1.8 1.9   Liver Function Tests: Recent Labs  Lab 12/23/21 0028  AST 32  ALT 14  ALKPHOS 98  BILITOT 0.6  PROT 7.2  ALBUMIN 4.0   No results for input(s): LIPASE, AMYLASE in the last 168 hours. No results for input(s): AMMONIA in the last 168 hours. CBC: Recent Labs  Lab 12/23/21 0028 12/23/21 0713 12/24/21 0446 12/25/21 0556  WBC 6.0 6.3 7.0 6.2  NEUTROABS 3.2  --   --   --   HGB 13.9 13.1 13.7 12.9*  HCT 42.9 39.2 40.7 38.7*  MCV 92.7 89.5 89.6 90.4  PLT 151 145* 150 151   Cardiac Enzymes: No results for input(s): CKTOTAL, CKMB, CKMBINDEX, TROPONINI in the last 168 hours. BNP: Invalid input(s): POCBNP CBG: Recent Labs  Lab 12/23/21 0222  GLUCAP 140*   D-Dimer No results for input(s): DDIMER in the last 72 hours. Hgb A1c Recent Labs    12/23/21 0028  HGBA1C 5.1   Lipid Profile Recent Labs    12/23/21 0028  CHOL 159  HDL 47  LDLCALC 99  TRIG 63  CHOLHDL 3.4   Thyroid function studies No results for input(s): TSH, T4TOTAL, T3FREE, THYROIDAB in the last 72 hours.  Invalid input(s): FREET3 Anemia work up No results for input(s): VITAMINB12, FOLATE, FERRITIN, TIBC, IRON, RETICCTPCT in the last 72 hours. Urinalysis    Component Value Date/Time   COLORURINE YELLOW (A) 03/14/2016 1625   APPEARANCEUR CLEAR (A) 03/14/2016 1625   LABSPEC 1.017 03/14/2016 1625   PHURINE 5.0 03/14/2016 1625   GLUCOSEU NEGATIVE 03/14/2016 1625   HGBUR NEGATIVE 03/14/2016 1625   BILIRUBINUR NEGATIVE 03/14/2016 1625   BILIRUBINUR neg 06/17/2015 0855   KETONESUR NEGATIVE 03/14/2016 1625   PROTEINUR NEGATIVE 03/14/2016 1625   UROBILINOGEN negative 06/17/2015 0855   UROBILINOGEN 1.0 10/04/2007 1300   NITRITE NEGATIVE 03/14/2016 1625   LEUKOCYTESUR NEGATIVE  03/14/2016 1625   Sepsis Labs Invalid input(s): PROCALCITONIN,  WBC,  LACTICIDVEN Microbiology Recent Results (from the past 240 hour(s))  Resp Panel by RT-PCR (Flu A&B, Covid) Nasopharyngeal Swab     Status: None   Collection Time: 12/23/21 12:28 AM   Specimen: Nasopharyngeal Swab; Nasopharyngeal(NP) swabs in vial transport medium  Result Value Ref Range Status   SARS Coronavirus 2 by RT PCR NEGATIVE NEGATIVE Final    Comment: (NOTE) SARS-CoV-2 target nucleic acids are NOT DETECTED.  The SARS-CoV-2 RNA is generally detectable in upper respiratory specimens during the acute phase of infection. The lowest concentration of SARS-CoV-2 viral copies this assay can detect is 138 copies/mL. A negative result does not preclude SARS-Cov-2 infection and should not be used as the sole basis for treatment or other patient management decisions. A negative result may occur with  improper specimen collection/handling, submission of specimen other than nasopharyngeal swab, presence of viral mutation(s) within the areas targeted by this assay, and inadequate number of viral copies(<138 copies/mL). A negative result must be combined with clinical observations, patient history, and epidemiological information. The expected result is Negative.  Fact Sheet for Patients:  EntrepreneurPulse.com.au  Fact Sheet for Healthcare Providers:  IncredibleEmployment.be  This test is no t yet approved or cleared by the [place] FDA and  has been authorized for detection and/or diagnosis of SARS-CoV-2 by FDA under an Emergency Use Authorization (EUA). This EUA will remain  in effect (meaning this test can be used) for the duration of the COVID-19 declaration under Section 564(b)(1) of the Act, 21 U.S.C.section 360bbb-3(b)(1), unless the authorization is terminated  or revoked sooner.       Influenza A by PCR NEGATIVE NEGATIVE Final   Influenza B by PCR NEGATIVE  NEGATIVE Final    Comment: (NOTE) The Xpert Xpress SARS-CoV-2/FLU/RSV plus assay is intended as an aid in the diagnosis of influenza from Nasopharyngeal swab specimens and should not be used as a sole basis for treatment. Nasal washings and aspirates are unacceptable for Xpert Xpress SARS-CoV-2/FLU/RSV testing.  Fact Sheet for Patients: EntrepreneurPulse.com.au  Fact Sheet for Healthcare Providers: IncredibleEmployment.be  This test is not yet approved or cleared by the [place] FDA and has been authorized for detection and/or diagnosis of SARS-CoV-2 by FDA under an Emergency Use Authorization (EUA). This EUA will remain in effect (meaning this test can be used) for the duration of the COVID-19 declaration under Section 564(b)(1) of the Act, 21 U.S.C. section 360bbb-3(b)(1), unless the authorization is terminated or revoked.  Performed at [facility], [address] [place], [place] [address]   MRSA Next Gen by PCR, Nasal     Status: None   Collection Time: 12/23/21  2:35 AM   Specimen: Nasal Mucosa; Nasal Swab  Result Value Ref Range Status   MRSA by PCR Next Gen NOT DETECTED NOT DETECTED Final    Comment: (NOTE) The GeneXpert MRSA Assay (FDA approved for NASAL specimens only), is one component of a comprehensive MRSA colonization surveillance program. It is not intended to diagnose MRSA infection nor to guide or monitor treatment for MRSA infections. Test performance is not FDA approved in patients less than 3 years old. Performed at [facility], [address] [place], [place] [address]      Total time spend on discharging this patient, including the last patient exam, discussing the hospital stay, instructions for ongoing care as it relates to all pertinent caregivers, as well as preparing the medical discharge records, prescriptions, and/or referrals as applicable, is 45 minutes.    Enzo Bi,  MD  [facility] 12/25/2021, 11:05 AM

## 2021-12-25 NOTE — Care Management Important Message (Signed)
Important Message  Patient Details  Name: [name] MRN: [id number] Date of Birth: Jun 24, 1935   Medicare Important Message Given:  Yes     Dannette Barbara 12/25/2021, 11:01 AM

## 2021-12-29 ENCOUNTER — Telehealth: Payer: Self-pay

## 2021-12-29 NOTE — Telephone Encounter (Signed)
Transition Care Management Follow-up Telephone Call Date of discharge and from where:TCM DC [facility] 12-25-21 Dx: STEMI  How have you been since you were released from the hospital? Doing fine  Any questions or concerns? No  Items Reviewed: Did the pt receive and understand the discharge instructions provided? Yes  Medications obtained and verified? Yes  Other? No  Any new allergies since your discharge? No  Dietary orders reviewed? Yes Do you have support at home? Yes   Home Care and Equipment/Supplies: Were home health services ordered? no If so, what is the name of the agency? na  Has the agency set up a time to come to the patient's home? not applicable Were any new equipment or medical supplies ordered?  No What is the name of the medical supply agency? na Were you able to get the supplies/equipment? not applicable Do you have any questions related to the use of the equipment or supplies? No  Functional Questionnaire: (I = Independent and D = Dependent) ADLs: I  Bathing/Dressing- I  Meal Prep- I  Eating- I  Maintaining continence- I  Transferring/Ambulation- I  Managing Meds- I  Follow up appointments reviewed:  PCP Hospital f/u appt confirmed? Yes  Scheduled to see Dr Silvio Pate on 01-12-22 @ [facility] f/u appt confirmed? Yes  Scheduled to see Dr Kathlen Mody on 01-07-22 @ 225pm. Are transportation arrangements needed? No  If their condition worsens, is the pt aware to call PCP or go to the Emergency Dept.? Yes Was the patient provided with contact information for the PCP's office or ED? Yes Was to pt encouraged to call back with questions or concerns? Yes

## 2022-01-07 ENCOUNTER — Ambulatory Visit (INDEPENDENT_AMBULATORY_CARE_PROVIDER_SITE_OTHER): Payer: Medicare Other | Admitting: Medical

## 2022-01-07 ENCOUNTER — Encounter: Payer: Self-pay | Admitting: Medical

## 2022-01-07 VITALS — BP 100/60 | HR 45 | Ht 72.0 in | Wt 180.5 lb

## 2022-01-07 DIAGNOSIS — R001 Bradycardia, unspecified: Secondary | ICD-10-CM | POA: Diagnosis not present

## 2022-01-07 DIAGNOSIS — I10 Essential (primary) hypertension: Secondary | ICD-10-CM

## 2022-01-07 DIAGNOSIS — I5022 Chronic systolic (congestive) heart failure: Secondary | ICD-10-CM

## 2022-01-07 DIAGNOSIS — I2102 ST elevation (STEMI) myocardial infarction involving left anterior descending coronary artery: Secondary | ICD-10-CM

## 2022-01-07 DIAGNOSIS — E782 Mixed hyperlipidemia: Secondary | ICD-10-CM

## 2022-01-07 DIAGNOSIS — I255 Ischemic cardiomyopathy: Secondary | ICD-10-CM | POA: Diagnosis not present

## 2022-01-07 DIAGNOSIS — I2510 Atherosclerotic heart disease of native coronary artery without angina pectoris: Secondary | ICD-10-CM | POA: Diagnosis not present

## 2022-01-07 MED ORDER — METOPROLOL SUCCINATE ER 25 MG PO TB24: 12.5000 mg | ORAL_TABLET | Freq: Every day | ORAL | 0 refills | Status: DC
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
## 2022-01-07 NOTE — Patient Instructions (Addendum)
Medication Instructions:  - Your physician has recommended you make the following change in your medication:   1) DECREASE Toprol XL (metoprolol succinate) 25 mg: - take 0.5 tablet (12.5 mg) by mouth once daily   *If you need a refill on your cardiac medications before your next appointment, please call your pharmacy*   Lab Work: - none ordered  If you have labs (blood work) drawn today and your tests are completely normal, you will receive your results only by: [place] (if you have MyChart) OR A paper copy in the mail If you have any lab test that is abnormal or we need to change your treatment, we will call you to review the results.   Testing/Procedures: - You have been referred to cardiac rehab at [facility]. This is a combination program including monitored exercise, dietary education, and support group. We strongly recommend participating in the program. Expect a phone call from them in approximately 2 weeks. However, if you do not hear from the cardiac rehab department after 2 weeks, please call them directly at [phone].     Follow-Up: At [facility] you and your health needs are our priority.  As part of our continuing mission to provide you with exceptional heart care, we have created designated Provider Care Teams.  These Care Teams include your primary Cardiologist (physician) and Advanced Practice Providers (APPs -  Physician Assistants and Nurse Practitioners) who all work together to provide you with the care you need, when you need it.  We recommend signing up for the patient portal called "MyChart".  Sign up information is provided on this After Visit Summary.  MyChart is used to connect with patients for Virtual Visits (Telemedicine).  Patients are able to view lab/test results, encounter notes, upcoming appointments, etc.  Non-urgent messages can be sent to your provider as well.   To learn more about what you can do with MyChart, go to  [email or web address].    Your next appointment:   3-4  week(s)  The format for your next appointment:   In Person  Provider:   You may see Nelva Bush, MD or one of the following Advanced Practice Providers on your designated Care Team:    Cadence Kathlen Mody, [place]    Other Instructions N/a  Important Information About Sugar

## 2022-01-12 ENCOUNTER — Ambulatory Visit (INDEPENDENT_AMBULATORY_CARE_PROVIDER_SITE_OTHER): Payer: Medicare Other | Admitting: Internal Medicine

## 2022-01-12 ENCOUNTER — Encounter: Payer: Self-pay | Admitting: Internal Medicine

## 2022-01-12 DIAGNOSIS — I255 Ischemic cardiomyopathy: Secondary | ICD-10-CM

## 2022-01-12 DIAGNOSIS — I2510 Atherosclerotic heart disease of native coronary artery without angina pectoris: Secondary | ICD-10-CM | POA: Diagnosis not present

## 2022-01-12 DIAGNOSIS — N138 Other obstructive and reflux uropathy: Secondary | ICD-10-CM | POA: Diagnosis not present

## 2022-01-12 DIAGNOSIS — N401 Enlarged prostate with lower urinary tract symptoms: Secondary | ICD-10-CM

## 2022-01-12 DIAGNOSIS — I5022 Chronic systolic (congestive) heart failure: Secondary | ICD-10-CM | POA: Diagnosis not present

## 2022-01-12 DIAGNOSIS — I25118 Atherosclerotic heart disease of native coronary artery with other forms of angina pectoris: Secondary | ICD-10-CM | POA: Insufficient documentation

## 2022-01-12 MED ORDER — TAMSULOSIN HCL 0.4 MG PO CAPS: 0.4000 mg | ORAL_CAPSULE | Freq: Every day | ORAL | 0 refills | Status: DC

## 2022-01-12 NOTE — Assessment & Plan Note (Signed)
Ongoing symptoms On finasteride 5, will reduce tamsulosin back to 0.4 daily (double dose didn't help)
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
## 2022-01-12 NOTE — Assessment & Plan Note (Signed)
Recent angioplasty of almost occluded LAD No symptoms now Will be set up for cardiac rehab DAPT----brilinta and ASA Reduced metoprolol 25---due to bradycardia (better today) Losartan 25 and atorvastatin 40

## 2022-01-12 NOTE — Assessment & Plan Note (Signed)
I suspect his EF will be better on recheck No symptoms now---on losartan, DAPT, metoprolol Discussed weighing daily Had heavily used salt---he has stopped this (and discussed healthy eating) Needs cardiac rehab

## 2022-01-25 ENCOUNTER — Encounter: Payer: Self-pay | Admitting: Emergency Medicine

## 2022-01-25 ENCOUNTER — Telehealth: Payer: Self-pay | Admitting: Internal Medicine

## 2022-01-25 ENCOUNTER — Telehealth: Payer: Self-pay

## 2022-01-25 ENCOUNTER — Emergency Department
Admission: EM | Admit: 2022-01-25 | Discharge: 2022-01-25 | Disposition: A | Discharge status: Home / Self Care | Payer: Medicare Other | Attending: Emergency Medicine | Admitting: Emergency Medicine

## 2022-01-25 ENCOUNTER — Other Ambulatory Visit: Payer: Self-pay

## 2022-01-25 DIAGNOSIS — R319 Hematuria, unspecified: Secondary | ICD-10-CM | POA: Diagnosis present

## 2022-01-25 DIAGNOSIS — N390 Urinary tract infection, site not specified: Secondary | ICD-10-CM

## 2022-01-25 LAB — CBC
HCT: 42.8 % (ref 39.0–52.0)
Hemoglobin: 14 g/dL (ref 13.0–17.0)
MCH: 30.6 pg (ref 26.0–34.0)
MCHC: 32.7 g/dL (ref 30.0–36.0)
MCV: 93.7 fL (ref 80.0–100.0)
Platelets: 165 10*3/uL (ref 150–400)
RBC: 4.57 MIL/uL (ref 4.22–5.81)
RDW: 12.7 % (ref 11.5–15.5)
WBC: 5.6 10*3/uL (ref 4.0–10.5)
nRBC: 0 % (ref 0.0–0.2)

## 2022-01-25 LAB — URINALYSIS, ROUTINE W REFLEX MICROSCOPIC
Bilirubin Urine: NEGATIVE
Glucose, UA: NEGATIVE mg/dL
Ketones, ur: NEGATIVE mg/dL
Nitrite: NEGATIVE
Protein, ur: 30 mg/dL — AB
RBC / HPF: >50 RBC/hpf — ABNORMAL HIGH (ref 0–5)
Specific Gravity, Urine: 1.012 (ref 1.005–1.030)
pH: 5 (ref 5.0–8.0)

## 2022-01-25 LAB — BASIC METABOLIC PANEL
Anion gap: 6 (ref 5–15)
BUN: 15 mg/dL (ref 8–23)
CO2: 25 mmol/L (ref 22–32)
Calcium: 9.5 mg/dL (ref 8.9–10.3)
Chloride: 107 mmol/L (ref 98–111)
Creatinine, Ser: 0.82 mg/dL (ref 0.61–1.24)
GFR, Estimated: >60 mL/min (ref >60)
Glucose, Bld: 94 mg/dL (ref 70–99)
Potassium: 4.5 mmol/L (ref 3.5–5.1)
Sodium: 138 mmol/L (ref 135–145)

## 2022-01-25 MED ORDER — CEPHALEXIN 500 MG PO CAPS: 500.0000 mg | ORAL_CAPSULE | Freq: Four times a day (QID) | ORAL | 0 refills | Status: AC

## 2022-01-25 NOTE — Telephone Encounter (Signed)
Patient currently being evaluated in the ED for Hematuria.

## 2022-01-25 NOTE — Telephone Encounter (Signed)
Patient has blood clot in his urine. Please advise

## 2022-01-25 NOTE — Telephone Encounter (Signed)
Patient called in stating that he was triaged for blood in urine, and triage outcome was to be seen in 4 hours. No available appointments please advise.

## 2022-01-25 NOTE — Telephone Encounter (Signed)
Called and spoke to pt. Our office is full today and they did not want to be out late as I offered to schedule him an OV at the [facility] in [place]. They are going to go somewhere in [place]. I will hold on to this and check on him tomorrow.

## 2022-01-26 LAB — URINE CULTURE: Culture: <10000 — AB

## 2022-01-27 ENCOUNTER — Encounter: Payer: Self-pay | Admitting: Medical

## 2022-01-27 ENCOUNTER — Ambulatory Visit (INDEPENDENT_AMBULATORY_CARE_PROVIDER_SITE_OTHER): Payer: Medicare Other | Admitting: Medical

## 2022-01-27 VITALS — BP 110/58 | HR 47 | Ht 72.0 in | Wt 181.4 lb

## 2022-01-27 DIAGNOSIS — R001 Bradycardia, unspecified: Secondary | ICD-10-CM | POA: Diagnosis not present

## 2022-01-27 DIAGNOSIS — I5022 Chronic systolic (congestive) heart failure: Secondary | ICD-10-CM

## 2022-01-27 DIAGNOSIS — I2510 Atherosclerotic heart disease of native coronary artery without angina pectoris: Secondary | ICD-10-CM

## 2022-01-27 DIAGNOSIS — E782 Mixed hyperlipidemia: Secondary | ICD-10-CM | POA: Diagnosis not present

## 2022-01-27 DIAGNOSIS — I213 ST elevation (STEMI) myocardial infarction of unspecified site: Secondary | ICD-10-CM | POA: Diagnosis not present

## 2022-01-27 MED ORDER — SPIRONOLACTONE 25 MG PO TABS: 12.5000 mg | ORAL_TABLET | Freq: Every day | ORAL | 2 refills | Status: DC
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
## 2022-01-27 NOTE — Addendum Note (Signed)
Addended by: Lamar Laundry on: 01/27/2022 04:27 PM   Modules accepted: Orders

## 2022-01-27 NOTE — Patient Instructions (Signed)
Medication Instructions:  Your physician has recommended you make the following change in your medication:   STOP Metoprolol  START Spironolactone 12.5 mg (1/2 tablet) daily. An Rx has been sent to your pharmacy.   *If you need a refill on your cardiac medications before your next appointment, please call your pharmacy*   Lab Work: Your physician recommends that you return for lab work (bmp) in: 1 week    If you have labs (blood work) drawn today and your tests are completely normal, you will receive your results only by: MyChart Message (if you have MyChart) OR A paper copy in the mail If you have any lab test that is abnormal or we need to change your treatment, we will call you to review the results.   Testing/Procedures: None ordered   Follow-Up: At [facility], you and your health needs are our priority.  As part of our continuing mission to provide you with exceptional heart care, we have created designated Provider Care Teams.  These Care Teams include your primary Cardiologist (physician) and Advanced Practice Providers (APPs -  Physician Assistants and Nurse Practitioners) who all work together to provide you with the care you need, when you need it.  We recommend signing up for the patient portal called "MyChart".  Sign up information is provided on this After Visit Summary.  MyChart is used to connect with patients for Virtual Visits (Telemedicine).  Patients are able to view lab/test results, encounter notes, upcoming appointments, etc.  Non-urgent messages can be sent to your provider as well.   To learn more about what you can do with MyChart, go to [email or web address].    Your next appointment:   4 week(s)  The format for your next appointment:   In Person  Provider:   You may see Nelva Bush, MD or one of the following Advanced Practice Providers on your designated Care Team:   Murray Hodgkins, NP Christell Faith, PA-C Cadence Kathlen Mody, PA-C{   Other  Instructions N/A  Important Information About Sugar

## 2022-02-01 ENCOUNTER — Encounter: Payer: Self-pay | Admitting: *Deleted

## 2022-02-01 ENCOUNTER — Encounter: Payer: Medicare Other | Attending: Internal Medicine | Admitting: *Deleted

## 2022-02-01 DIAGNOSIS — Z955 Presence of coronary angioplasty implant and graft: Secondary | ICD-10-CM | POA: Insufficient documentation

## 2022-02-01 DIAGNOSIS — I213 ST elevation (STEMI) myocardial infarction of unspecified site: Secondary | ICD-10-CM | POA: Insufficient documentation

## 2022-02-01 NOTE — Progress Notes (Signed)
Virtual orientation call completed today. he has an appointment on Date: 02/15/2022  for EP eval and gym Orientation.  Documentation of diagnosis can be found in [facility]  Date: 12/23/2025 .

## 2022-02-03 ENCOUNTER — Encounter: Payer: Self-pay | Admitting: Internal Medicine

## 2022-02-03 ENCOUNTER — Ambulatory Visit (INDEPENDENT_AMBULATORY_CARE_PROVIDER_SITE_OTHER): Payer: Medicare Other | Admitting: Internal Medicine

## 2022-02-03 VITALS — BP 110/68 | HR 70 | Temp 97.7°F | Ht 72.0 in | Wt 175.0 lb

## 2022-02-03 DIAGNOSIS — N481 Balanitis: Secondary | ICD-10-CM | POA: Diagnosis not present

## 2022-02-03 DIAGNOSIS — R319 Hematuria, unspecified: Secondary | ICD-10-CM | POA: Insufficient documentation

## 2022-02-03 DIAGNOSIS — I5022 Chronic systolic (congestive) heart failure: Secondary | ICD-10-CM

## 2022-02-03 DIAGNOSIS — R310 Gross hematuria: Secondary | ICD-10-CM

## 2022-02-03 DIAGNOSIS — I255 Ischemic cardiomyopathy: Secondary | ICD-10-CM | POA: Diagnosis not present

## 2022-02-03 LAB — RENAL FUNCTION PANEL
Albumin: 4.3 g/dL (ref 3.5–5.2)
BUN: 16 mg/dL (ref 6–23)
CO2: 26 mEq/L (ref 19–32)
Calcium: 9.9 mg/dL (ref 8.4–10.5)
Chloride: 101 mEq/L (ref 96–112)
Creatinine, Ser: 0.86 mg/dL (ref 0.40–1.50)
GFR: 79.22 mL/min (ref >60.00)
Glucose, Bld: 94 mg/dL (ref 70–99)
Phosphorus: 3.1 mg/dL (ref 2.3–4.6)
Potassium: 4.2 mEq/L (ref 3.5–5.1)
Sodium: 137 mEq/L (ref 135–145)

## 2022-02-03 MED ORDER — KETOCONAZOLE 2 % EX CREA: 1.0000 | TOPICAL_CREAM | Freq: Two times a day (BID) | CUTANEOUS | 1 refills | Status: DC | PRN

## 2022-02-03 NOTE — Assessment & Plan Note (Signed)
At the end of the visit--he mentions redness at the base of his glans---only since antibiotics Has some redness/mild balanitis Will Rx ketoconazole cream for this

## 2022-02-03 NOTE — Assessment & Plan Note (Signed)
Recent MI Beta blocker stopped due to bradycardia and he feels better Now on spironolactone 12.'5mg'$  daily---will check renal profile Losartan 25, ASA 81, ticagrelor 90 bid, atorvastatin 40 jardiance a reasonable choice once the urinary issues are resolved

## 2022-02-03 NOTE — Assessment & Plan Note (Signed)
Urine culture negative Still with frequency despite the antibiotic Could be cystitis --but this is a diagnosis of exclusion in this setting Despite no tobacco history---will need hematuria work up. Refer to urology [facility]
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
## 2022-02-05 ENCOUNTER — Telehealth: Payer: Self-pay

## 2022-02-05 NOTE — Telephone Encounter (Signed)
I left a message for the patient to return my call.

## 2022-02-05 NOTE — Telephone Encounter (Signed)
Patient called back. Gave message below, patient had no questions.

## 2022-02-05 NOTE — Telephone Encounter (Signed)
-----   Message from Venia Carbon, MD sent at 02/05/2022  1:11 PM EDT ----- Please send copy Your kidney tests and potassium remain normal I have sent this over to the cardiology office

## 2022-02-10 ENCOUNTER — Inpatient Hospital Stay: Payer: Medicare Other | Admitting: Internal Medicine

## 2022-02-15 ENCOUNTER — Encounter: Payer: Medicare Other | Admitting: *Deleted

## 2022-02-15 VITALS — Ht 71.0 in | Wt 178.4 lb

## 2022-02-15 DIAGNOSIS — I213 ST elevation (STEMI) myocardial infarction of unspecified site: Secondary | ICD-10-CM

## 2022-02-15 DIAGNOSIS — Z955 Presence of coronary angioplasty implant and graft: Secondary | ICD-10-CM | POA: Diagnosis not present

## 2022-02-15 NOTE — Patient Instructions (Signed)
Patient Instructions  Patient Details  Name: [name] MRN: [id number] Date of Birth: [birth year] Referring Provider:  Nelva Bush, MD  Below are your personal goals for exercise, nutrition, and risk factors. Our goal is to help you stay on track towards obtaining and maintaining these goals. We will be discussing your progress on these goals with you throughout the program.  Initial Exercise Prescription:  Initial Exercise Prescription - 02/15/22 1500       Date of Initial Exercise RX and Referring Provider   Date 02/15/22    Referring Provider End      Oxygen   Maintain Oxygen Saturation 88% or higher      NuStep   Level 11    SPM 80    Minutes 15    METs 3.16      Arm Ergometer   Level 1    RPM 30    Minutes 15    METs 3.16      Track   Laps 25    Minutes 15    METs 2.86      Prescription Details   Frequency (times per week) 3    Duration Progress to 30 minutes of continuous aerobic without signs/symptoms of physical distress      Intensity   THRR 40-80% of Max Heartrate 95-122    Ratings of Perceived Exertion 11-13    Perceived Dyspnea 0-4      Progression   Progression Continue to progress workloads to maintain intensity without signs/symptoms of physical distress.      Resistance Training   Training Prescription Yes    Weight 2    Reps 10-15             Exercise Goals: Frequency: Be able to perform aerobic exercise two to three times per week in program working toward 2-5 days per week of home exercise.  Intensity: Work with a perceived exertion of 11 (fairly light) - 15 (hard) while following your exercise prescription.  We will make changes to your prescription with you as you progress through the program.   Duration: Be able to do 30 to 45 minutes of continuous aerobic exercise in addition to a 5 minute warm-up and a 5 minute cool-down routine.   Nutrition Goals: Your personal nutrition goals will be established when you do your  nutrition analysis with the dietician.  The following are general nutrition guidelines to follow: Cholesterol < '200mg'$ /day Sodium < '1500mg'$ /day Fiber: Men over 50 yrs - 30 grams per day  Personal Goals:  Personal Goals and Risk Factors at Admission - 02/15/22 1541       Core Components/Risk Factors/Patient Goals on Admission    Weight Management Yes    Intervention Weight Management: Develop a combined nutrition and exercise program designed to reach desired caloric intake, while maintaining appropriate intake of nutrient and fiber, sodium and fats, and appropriate energy expenditure required for the weight goal.;Weight Management: Provide education and appropriate resources to help participant work on and attain dietary goals.;Weight Management/Obesity: Establish reasonable short term and long term weight goals.    Admit Weight 178 lb 6.4 oz (80.9 kg)    Goal Weight: Short Term 178 lb 6.4 oz (80.9 kg)    Goal Weight: Long Term 178 lb 6.4 oz (80.9 kg)    Expected Outcomes Short Term: Continue to assess and modify interventions until short term weight is achieved;Long Term: Adherence to nutrition and physical activity/exercise program aimed toward attainment of established weight goal;Weight Maintenance: Understanding  of the daily nutrition guidelines, which includes 25-35% calories from fat, 7% or less cal from saturated fats, less than '200mg'$  cholesterol, less than 1.5gm of sodium, & 5 or more servings of fruits and vegetables daily;Understanding recommendations for meals to include 15-35% energy as protein, 25-35% energy from fat, 35-60% energy from carbohydrates, less than '200mg'$  of dietary cholesterol, 20-35 gm of total fiber daily;Understanding of distribution of calorie intake throughout the day with the consumption of 4-5 meals/snacks    Heart Failure Yes    Intervention Provide a combined exercise and nutrition program that is supplemented with education, support and counseling about heart  failure. Directed toward relieving symptoms such as shortness of breath, decreased exercise tolerance, and extremity edema.    Expected Outcomes Improve functional capacity of life;Short term: Attendance in program 2-3 days a week with increased exercise capacity. Reported lower sodium intake. Reported increased fruit and vegetable intake. Reports medication compliance.;Short term: Daily weights obtained and reported for increase. Utilizing diuretic protocols set by physician.;Long term: Adoption of self-care skills and reduction of barriers for early signs and symptoms recognition and intervention leading to self-care maintenance.    Hypertension Yes    Intervention Provide education on lifestyle modifcations including regular physical activity/exercise, weight management, moderate sodium restriction and increased consumption of fresh fruit, vegetables, and low fat dairy, alcohol moderation, and smoking cessation.;Monitor prescription use compliance.    Expected Outcomes Short Term: Continued assessment and intervention until BP is < 140/73m HG in hypertensive participants. < 130/828mHG in hypertensive participants with diabetes, heart failure or chronic kidney disease.;Long Term: Maintenance of blood pressure at goal levels.    Lipids Yes    Intervention Provide education and support for participant on nutrition & aerobic/resistive exercise along with prescribed medications to achieve LDL '70mg'$ , HDL >'40mg'$ .    Expected Outcomes Short Term: Participant states understanding of desired cholesterol values and is compliant with medications prescribed. Participant is following exercise prescription and nutrition guidelines.;Long Term: Cholesterol controlled with medications as prescribed, with individualized exercise RX and with personalized nutrition plan. Value goals: LDL < '70mg'$ , HDL > 40 mg.             Tobacco Use Initial Evaluation: Social History   Tobacco Use  Smoking Status Never  Smokeless  Tobacco Never    Exercise Goals and Review:  Exercise Goals     Row Name 02/15/22 1536             Exercise Goals   Increase Physical Activity Yes       Intervention Provide advice, education, support and counseling about physical activity/exercise needs.;Develop an individualized exercise prescription for aerobic and resistive training based on initial evaluation findings, risk stratification, comorbidities and participant's personal goals.       Expected Outcomes Short Term: Attend rehab on a regular basis to increase amount of physical activity.;Long Term: Add in home exercise to make exercise part of routine and to increase amount of physical activity.;Long Term: Exercising regularly at least 3-5 days a week.       Increase Strength and Stamina Yes       Intervention Provide advice, education, support and counseling about physical activity/exercise needs.;Develop an individualized exercise prescription for aerobic and resistive training based on initial evaluation findings, risk stratification, comorbidities and participant's personal goals.       Expected Outcomes Short Term: Increase workloads from initial exercise prescription for resistance, speed, and METs.;Short Term: Perform resistance training exercises routinely during rehab and add in resistance training  at home;Long Term: Improve cardiorespiratory fitness, muscular endurance and strength as measured by increased METs and functional capacity (6MWT)       Able to understand and use rate of perceived exertion (RPE) scale Yes       Intervention Provide education and explanation on how to use RPE scale       Expected Outcomes Short Term: Able to use RPE daily in rehab to express subjective intensity level;Long Term:  Able to use RPE to guide intensity level when exercising independently       Able to understand and use Dyspnea scale Yes       Intervention Provide education and explanation on how to use Dyspnea scale       Expected  Outcomes Short Term: Able to use Dyspnea scale daily in rehab to express subjective sense of shortness of breath during exertion;Long Term: Able to use Dyspnea scale to guide intensity level when exercising independently       Knowledge and understanding of Target Heart Rate Range (THRR) Yes       Intervention Provide education and explanation of THRR including how the numbers were predicted and where they are located for reference       Expected Outcomes Short Term: Able to state/look up THRR;Short Term: Able to use daily as guideline for intensity in rehab;Long Term: Able to use THRR to govern intensity when exercising independently       Able to check pulse independently Yes       Intervention Provide education and demonstration on how to check pulse in carotid and radial arteries.;Review the importance of being able to check your own pulse for safety during independent exercise       Expected Outcomes Short Term: Able to explain why pulse checking is important during independent exercise;Long Term: Able to check pulse independently and accurately       Understanding of Exercise Prescription Yes       Intervention Provide education, explanation, and written materials on patient's individual exercise prescription       Expected Outcomes Short Term: Able to explain program exercise prescription;Long Term: Able to explain home exercise prescription to exercise independently                Copy of goals given to participant.

## 2022-02-15 NOTE — Progress Notes (Signed)
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 is following exercise prescription and nutrition guidelines.;Long Term: Cholesterol controlled with medications as prescribed, with individualized exercise RX and with personalized nutrition plan. Value goals: LDL < '70mg'$ , HDL > 40 mg.             Education:Diabetes - Individual verbal and written instruction to review signs/symptoms of diabetes, desired ranges of glucose level fasting, after meals and with exercise. Acknowledge that pre and post exercise glucose checks will be done for 3 sessions at entry of program.   Core Components/Risk Factors/Patient Goals Review:    Core Components/Risk Factors/Patient Goals at Discharge (Final Review):    ITP Comments:  ITP Comments     Row Name 02/01/22 1355 02/15/22 1530         ITP Comments Virtual orientation call completed today. he has an appointment on Date: 02/15/2022  for EP eval and gym Orientation.   [facility] . Completed 6MWT and gym orientation. Initial ITP created and sent for review to Dr. MaEmily FilbertMedical Director.               Comments: initial ITP

## 2022-02-18 ENCOUNTER — Encounter: Payer: Medicare Other | Admitting: *Deleted

## 2022-02-18 DIAGNOSIS — I213 ST elevation (STEMI) myocardial infarction of unspecified site: Secondary | ICD-10-CM | POA: Diagnosis not present

## 2022-02-18 DIAGNOSIS — Z955 Presence of coronary angioplasty implant and graft: Secondary | ICD-10-CM

## 2022-02-18 NOTE — Progress Notes (Signed)
Daily Session Note  Patient Details  Name: [name] MRN: [id number] Date of Birth: [birth year] Referring Provider:   Flowsheet Row Cardiac Rehab from 02/15/2022 in [facility] Cardiac and Pulmonary Rehab  Referring Provider End       Encounter Date: 02/18/2022  Check In:  Session Check In - 02/18/22 1530       Check-In   Supervising physician immediately available to respond to emergencies See telemetry face sheet for immediately available ER MD    Location [facility]-Cardiac & Pulmonary Rehab    Staff Present Renita Papa, RN BSN;Jessica Luan Pulling, MA, RCEP, CCRP, CCET;Melissa [place], [place], LDN    Virtual Visit No    Medication changes reported     No    Fall or balance concerns reported    No    Warm-up and Cool-down Performed on first and last piece of equipment    Resistance Training Performed Yes    VAD Patient? No    PAD/SET Patient? No      Pain Assessment   Currently in Pain? No/denies                Social History   Tobacco Use  Smoking Status Never  Smokeless Tobacco Never    Goals Met:  Independence with exercise equipment Exercise tolerated well No report of concerns or symptoms today Strength training completed today  Goals Unmet:  Not Applicable  Comments: First full day of exercise!  Patient was oriented to gym and equipment including functions, settings, policies, and procedures.  Patient's individual exercise prescription and treatment plan were reviewed.  All starting workloads were established based on the results of the 6 minute walk test done at initial orientation visit.  The plan for exercise progression was also introduced and progression will be customized based on patient's performance and goals.    Dr. Emily Filbert is Medical Director for [place].  Dr. Ottie Glazier is Medical Director for [facility] Pulmonary Rehabilitation.

## 2022-02-19 ENCOUNTER — Ambulatory Visit (INDEPENDENT_AMBULATORY_CARE_PROVIDER_SITE_OTHER): Payer: Medicare Other | Admitting: Urology

## 2022-02-19 ENCOUNTER — Encounter: Payer: Self-pay | Admitting: Urology

## 2022-02-19 VITALS — BP 116/72 | HR 50 | Ht 72.0 in | Wt 174.0 lb

## 2022-02-19 DIAGNOSIS — R310 Gross hematuria: Secondary | ICD-10-CM | POA: Diagnosis not present

## 2022-02-19 LAB — URINALYSIS, COMPLETE
Bilirubin, UA: NEGATIVE
Glucose, UA: NEGATIVE
Ketones, UA: NEGATIVE
Nitrite, UA: NEGATIVE
Specific Gravity, UA: 1.02 (ref 1.005–1.030)
Urobilinogen, Ur: 1 mg/dL (ref 0.2–1.0)
pH, UA: 5 (ref 5.0–7.5)

## 2022-02-19 LAB — MICROSCOPIC EXAMINATION: RBC, Urine: >30 /hpf — AB (ref 0–2)
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
## 2022-02-20 ENCOUNTER — Encounter: Payer: Self-pay | Admitting: Urology

## 2022-02-22 ENCOUNTER — Encounter: Payer: Medicare Other | Admitting: *Deleted

## 2022-02-22 DIAGNOSIS — Z955 Presence of coronary angioplasty implant and graft: Secondary | ICD-10-CM

## 2022-02-22 DIAGNOSIS — I213 ST elevation (STEMI) myocardial infarction of unspecified site: Secondary | ICD-10-CM | POA: Diagnosis not present

## 2022-02-22 NOTE — Progress Notes (Signed)
Daily Session Note  Patient Details  Name: [name] MRN: [id number] Date of Birth: 1934/09/18 Referring Provider:   Flowsheet Row Cardiac Rehab from 02/15/2022 in [facility] Cardiac and Pulmonary Rehab  Referring Provider End       Encounter Date: 02/22/2022  Check In:  Session Check In - 02/22/22 1537       Check-In   Supervising physician immediately available to respond to emergencies See telemetry face sheet for immediately available ER MD    Location [facility]-Cardiac & Pulmonary Rehab    Staff Present Nyoka Cowden, RN, BSN, Ardeth Sportsman, RDN, LDN;Meredith Sherryll Burger, RN BSN    Virtual Visit No    Medication changes reported     No    Fall or balance concerns reported    No    Tobacco Cessation No Change    Warm-up and Cool-down Performed on first and last piece of equipment    Resistance Training Performed Yes    VAD Patient? No    PAD/SET Patient? No      Pain Assessment   Currently in Pain? No/denies                Social History   Tobacco Use  Smoking Status Never  Smokeless Tobacco Never    Goals Met:  Independence with exercise equipment Exercise tolerated well No report of concerns or symptoms today  Goals Unmet:  Not Applicable  Comments: Pt able to follow exercise prescription today without complaint.  Will continue to monitor for progression.    Dr. Emily Filbert is Medical Director for [place].  Dr. Ottie Glazier is Medical Director for [facility] Pulmonary Rehabilitation.

## 2022-02-23 ENCOUNTER — Encounter: Payer: Self-pay | Admitting: Medical

## 2022-02-23 ENCOUNTER — Ambulatory Visit (INDEPENDENT_AMBULATORY_CARE_PROVIDER_SITE_OTHER): Payer: Medicare Other | Admitting: Medical

## 2022-02-23 VITALS — BP 108/61 | HR 83 | Ht 72.0 in | Wt 177.6 lb

## 2022-02-23 DIAGNOSIS — I5022 Chronic systolic (congestive) heart failure: Secondary | ICD-10-CM

## 2022-02-23 DIAGNOSIS — I2102 ST elevation (STEMI) myocardial infarction involving left anterior descending coronary artery: Secondary | ICD-10-CM

## 2022-02-23 DIAGNOSIS — I255 Ischemic cardiomyopathy: Secondary | ICD-10-CM

## 2022-02-23 DIAGNOSIS — I2510 Atherosclerotic heart disease of native coronary artery without angina pectoris: Secondary | ICD-10-CM | POA: Diagnosis not present

## 2022-02-23 DIAGNOSIS — I5020 Unspecified systolic (congestive) heart failure: Secondary | ICD-10-CM

## 2022-02-23 DIAGNOSIS — I10 Essential (primary) hypertension: Secondary | ICD-10-CM | POA: Diagnosis not present

## 2022-02-23 DIAGNOSIS — R001 Bradycardia, unspecified: Secondary | ICD-10-CM

## 2022-02-23 NOTE — Patient Instructions (Signed)
Medication Instructions:   Your physician recommends that you continue on your current medications as directed. Please refer to the Current Medication list given to you today.  *If you need a refill on your cardiac medications before your next appointment, please call your pharmacy*   Lab Work:  None ordered  Testing/Procedures:  None ordered   Follow-Up: At [facility], you and your health needs are our priority.  As part of our continuing mission to provide you with exceptional heart care, we have created designated Provider Care Teams.  These Care Teams include your primary Cardiologist (physician) and Advanced Practice Providers (APPs -  Physician Assistants and Nurse Practitioners) who all work together to provide you with the care you need, when you need it.  We recommend signing up for the patient portal called "MyChart".  Sign up information is provided on this After Visit Summary.  MyChart is used to connect with patients for Virtual Visits (Telemedicine).  Patients are able to view lab/test results, encounter notes, upcoming appointments, etc.  Non-urgent messages can be sent to your provider as well.   To learn more about what you can do with MyChart, go to [email or web address].    Your next appointment:   3 month(s)  The format for your next appointment:   In Person  Provider:   You may see Dr. Harrell Gave End or one of the following Advanced Practice Providers on your designated Care Team:   Murray Hodgkins, NP Christell Faith, PA-C Cadence Kathlen Mody, PA-C{   Important Information About Sugar
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
## 2022-02-24 ENCOUNTER — Encounter: Payer: Medicare Other | Admitting: *Deleted

## 2022-02-24 ENCOUNTER — Telehealth: Payer: Self-pay | Admitting: Medical

## 2022-02-24 DIAGNOSIS — I213 ST elevation (STEMI) myocardial infarction of unspecified site: Secondary | ICD-10-CM | POA: Diagnosis not present

## 2022-02-24 DIAGNOSIS — Z955 Presence of coronary angioplasty implant and graft: Secondary | ICD-10-CM | POA: Diagnosis not present

## 2022-02-24 NOTE — Telephone Encounter (Signed)
-----   Message from Solmon Ice, RN sent at 02/23/2022  3:35 PM EDT ----- Regarding: Echo Cadence gave me orders after pt checked out to have an echo scheduled in 1 month. I called and told the pt's wife we would be calling to schedule. Thanks!

## 2022-02-24 NOTE — Telephone Encounter (Signed)
Attempted to schedule echo.  Order in [place] . Closing encounter.

## 2022-02-24 NOTE — Progress Notes (Signed)
Daily Session Note  Patient Details  Name: [name] MRN: [id number] Date of Birth: 1934-11-08 Referring Provider:   Flowsheet Row Cardiac Rehab from 02/15/2022 in [facility] Cardiac and Pulmonary Rehab  Referring Provider End       Encounter Date: 02/24/2022  Check In:  Session Check In - 02/24/22 1619       Check-In   Supervising physician immediately available to respond to emergencies See telemetry face sheet for immediately available ER MD    Location [facility]-Cardiac & Pulmonary Rehab    Staff Present Nyoka Cowden, RN, BSN, MA;Meredith Sherryll Burger, RN BSN;Melissa Tilford Pillar, RDN, LDN    Virtual Visit No    Medication changes reported     No    Fall or balance concerns reported    No    Tobacco Cessation No Change    Warm-up and Cool-down Performed on first and last piece of equipment    Resistance Training Performed Yes    VAD Patient? No    PAD/SET Patient? No      Pain Assessment   Currently in Pain? No/denies                Social History   Tobacco Use  Smoking Status Never  Smokeless Tobacco Never    Goals Met:  Independence with exercise equipment Exercise tolerated well No report of concerns or symptoms today  Goals Unmet:  Not Applicable  Comments: Pt able to follow exercise prescription today without complaint.  Will continue to monitor for progression.    Dr. Emily Filbert is Medical Director for [place].  Dr. Ottie Glazier is Medical Director for [facility] Pulmonary Rehabilitation.

## 2022-02-25 ENCOUNTER — Encounter: Payer: Medicare Other | Admitting: *Deleted

## 2022-02-25 DIAGNOSIS — Z955 Presence of coronary angioplasty implant and graft: Secondary | ICD-10-CM | POA: Diagnosis not present

## 2022-02-25 DIAGNOSIS — I213 ST elevation (STEMI) myocardial infarction of unspecified site: Secondary | ICD-10-CM | POA: Diagnosis not present

## 2022-02-25 NOTE — Progress Notes (Signed)
Daily Session Note  Patient Details  Name: [name] MRN: [id number] Date of Birth: June 20, 1935 Referring Provider:   Flowsheet Row Cardiac Rehab from 02/15/2022 in [facility] Cardiac and Pulmonary Rehab  Referring Provider End       Encounter Date: 02/25/2022  Check In:  Session Check In - 02/25/22 1548       Check-In   Supervising physician immediately available to respond to emergencies See telemetry face sheet for immediately available ER MD    Location [facility]-Cardiac & Pulmonary Rehab    Staff Present Hope Budds, RDN, LDN;Jessica Luan Pulling, MA, RCEP, CCRP, CCET;Celina Shiley Sherryll Burger, RN BSN    Virtual Visit No    Medication changes reported     No    Fall or balance concerns reported    No    Warm-up and Cool-down Performed on first and last piece of equipment    Resistance Training Performed Yes    VAD Patient? No    PAD/SET Patient? No      Pain Assessment   Currently in Pain? No/denies                Social History   Tobacco Use  Smoking Status Never  Smokeless Tobacco Never    Goals Met:  Independence with exercise equipment Exercise tolerated well No report of concerns or symptoms today Strength training completed today  Goals Unmet:  Not Applicable  Comments: Pt able to follow exercise prescription today without complaint.  Will continue to monitor for progression.    Dr. Emily Filbert is Medical Director for [place].  Dr. Ottie Glazier is Medical Director for [facility] Pulmonary Rehabilitation.

## 2022-03-01 ENCOUNTER — Encounter: Payer: Medicare Other | Admitting: *Deleted

## 2022-03-01 DIAGNOSIS — I213 ST elevation (STEMI) myocardial infarction of unspecified site: Secondary | ICD-10-CM | POA: Diagnosis not present

## 2022-03-01 DIAGNOSIS — Z955 Presence of coronary angioplasty implant and graft: Secondary | ICD-10-CM | POA: Diagnosis not present

## 2022-03-01 NOTE — Progress Notes (Signed)
Daily Session Note  Patient Details  Name: RANDOM DOBROWSKI MRN: [id number] Date of Birth: [birth year] Referring Provider:   Flowsheet Row Cardiac Rehab from 02/15/2022 in [facility] Cardiac and Pulmonary Rehab  Referring Provider End       Encounter Date: 03/01/2022  Check In:  Session Check In - 03/01/22 1526       Check-In   Supervising physician immediately available to respond to emergencies See telemetry face sheet for immediately available ER MD    Location [facility]-Cardiac & Pulmonary Rehab    Staff Present Nyoka Cowden, RN, BSN, MA;Meredith Sherryll Burger, RN BSN    Virtual Visit No    Medication changes reported     No    Fall or balance concerns reported    No    Tobacco Cessation No Change    Warm-up and Cool-down Performed on first and last piece of equipment    Resistance Training Performed Yes    VAD Patient? No      Pain Assessment   Currently in Pain? No/denies                Social History   Tobacco Use  Smoking Status Never  Smokeless Tobacco Never    Goals Met:  Independence with exercise equipment Exercise tolerated well No report of concerns or symptoms today  Goals Unmet:  Not Applicable  Comments: Pt able to follow exercise prescription today without complaint.  Will continue to monitor for progression.    Dr. Emily Filbert is Medical Director for [place].  Dr. Ottie Glazier is Medical Director for [place] [facility] Pulmonary Rehabilitation.

## 2022-03-03 ENCOUNTER — Encounter: Payer: Medicare Other | Attending: Internal Medicine | Admitting: *Deleted

## 2022-03-03 DIAGNOSIS — Z955 Presence of coronary angioplasty implant and graft: Secondary | ICD-10-CM | POA: Diagnosis not present

## 2022-03-03 DIAGNOSIS — I2510 Atherosclerotic heart disease of native coronary artery without angina pectoris: Secondary | ICD-10-CM | POA: Diagnosis not present

## 2022-03-03 DIAGNOSIS — I213 ST elevation (STEMI) myocardial infarction of unspecified site: Secondary | ICD-10-CM

## 2022-03-03 DIAGNOSIS — I252 Old myocardial infarction: Secondary | ICD-10-CM | POA: Insufficient documentation

## 2022-03-03 DIAGNOSIS — Z5189 Encounter for other specified aftercare: Secondary | ICD-10-CM | POA: Diagnosis not present

## 2022-03-03 NOTE — Progress Notes (Signed)
Daily Session Note  Patient Details  Name: [name] MRN: [id number] Date of Birth: [birth year] Referring Provider:   Flowsheet Row Cardiac Rehab from 02/15/2022 in [facility] Cardiac and Pulmonary Rehab  Referring Provider End       Encounter Date: 03/03/2022  Check In:  Session Check In - 03/03/22 1741       Check-In   Supervising physician immediately available to respond to emergencies See telemetry face sheet for immediately available ER MD    Location [facility]-Cardiac & Pulmonary Rehab    Staff Present Nyoka Cowden, RN, BSN, MA;Meredith Sherryll Burger, RN BSN    Virtual Visit No    Medication changes reported     No    Fall or balance concerns reported    No    Warm-up and Cool-down Performed on first and last piece of equipment    Resistance Training Performed Yes    VAD Patient? No    PAD/SET Patient? No      Pain Assessment   Currently in Pain? No/denies                Social History   Tobacco Use  Smoking Status Never  Smokeless Tobacco Never    Goals Met:  Independence with exercise equipment Exercise tolerated well No report of concerns or symptoms today  Goals Unmet:  Not Applicable  Comments: Pt able to follow exercise prescription today without complaint.  Will continue to monitor for progression.    Dr. Emily Filbert is Medical Director for [place].  Dr. Ottie Glazier is Medical Director for [facility] Pulmonary Rehabilitation.

## 2022-03-04 ENCOUNTER — Encounter: Payer: Medicare Other | Admitting: *Deleted

## 2022-03-04 DIAGNOSIS — I252 Old myocardial infarction: Secondary | ICD-10-CM | POA: Diagnosis not present

## 2022-03-04 DIAGNOSIS — I213 ST elevation (STEMI) myocardial infarction of unspecified site: Secondary | ICD-10-CM

## 2022-03-04 DIAGNOSIS — Z5189 Encounter for other specified aftercare: Secondary | ICD-10-CM | POA: Diagnosis not present

## 2022-03-04 DIAGNOSIS — I2510 Atherosclerotic heart disease of native coronary artery without angina pectoris: Secondary | ICD-10-CM | POA: Diagnosis not present

## 2022-03-04 DIAGNOSIS — Z955 Presence of coronary angioplasty implant and graft: Secondary | ICD-10-CM

## 2022-03-04 NOTE — Progress Notes (Signed)
Daily Session Note  Patient Details  Name: [name] MRN: [id number] Date of Birth: [birth year] Referring Provider:   Flowsheet Row Cardiac Rehab from 02/15/2022 in [facility] Cardiac and Pulmonary Rehab  Referring Provider End       Encounter Date: 03/04/2022  Check In:  Session Check In - 03/04/22 1617       Check-In   Supervising physician immediately available to respond to emergencies See telemetry face sheet for immediately available ER MD    Location [facility]-Cardiac & Pulmonary Rehab    Staff Present Heath Lark, RN, BSN, CCRP;Meredith Sherryll Burger, RN Margurite Auerbach, MS, ASCM CEP, Exercise Physiologist    Virtual Visit No    Medication changes reported     No    Fall or balance concerns reported    No    Warm-up and Cool-down Performed on first and last piece of equipment    Resistance Training Performed Yes    VAD Patient? No    PAD/SET Patient? No      Pain Assessment   Currently in Pain? No/denies                Social History   Tobacco Use  Smoking Status Never  Smokeless Tobacco Never    Goals Met:  Independence with exercise equipment Exercise tolerated well No report of concerns or symptoms today  Goals Unmet:  Not Applicable  Comments: Pt able to follow exercise prescription today without complaint.  Will continue to monitor for progression.    Dr. Emily Filbert is Medical Director for [place].  Dr. Ottie Glazier is Medical Director for [facility] Pulmonary Rehabilitation.

## 2022-03-05 ENCOUNTER — Ambulatory Visit (INDEPENDENT_AMBULATORY_CARE_PROVIDER_SITE_OTHER): Payer: Medicare Other

## 2022-03-05 ENCOUNTER — Ambulatory Visit
Admission: RE | Admit: 2022-03-05 | Discharge: 2022-03-05 | Disposition: A | Discharge status: Home / Self Care | Payer: Medicare Other | Source: Ambulatory Visit | Attending: Urology | Admitting: Urology

## 2022-03-05 DIAGNOSIS — R310 Gross hematuria: Secondary | ICD-10-CM | POA: Diagnosis not present

## 2022-03-05 DIAGNOSIS — I5022 Chronic systolic (congestive) heart failure: Secondary | ICD-10-CM

## 2022-03-05 DIAGNOSIS — I5020 Unspecified systolic (congestive) heart failure: Secondary | ICD-10-CM | POA: Diagnosis not present

## 2022-03-05 DIAGNOSIS — N3289 Other specified disorders of bladder: Secondary | ICD-10-CM | POA: Diagnosis not present

## 2022-03-05 DIAGNOSIS — N200 Calculus of kidney: Secondary | ICD-10-CM | POA: Diagnosis not present

## 2022-03-05 LAB — ECHOCARDIOGRAM LIMITED
Area-P 1/2: 2.53 cm2
Calc EF: 50 %
S' Lateral: 3.7 cm
Single Plane A2C EF: 49 %
Single Plane A4C EF: 51.6 %

## 2022-03-05 MED ORDER — IOHEXOL 300 MG/ML  SOLN
125.0000 mL | Freq: Once | INTRAMUSCULAR | Status: AC | PRN
  Administered 2022-03-05: 125 mL via INTRAVENOUS

## 2022-03-08 ENCOUNTER — Encounter: Payer: Medicare Other | Admitting: *Deleted

## 2022-03-08 DIAGNOSIS — I213 ST elevation (STEMI) myocardial infarction of unspecified site: Secondary | ICD-10-CM

## 2022-03-08 DIAGNOSIS — Z955 Presence of coronary angioplasty implant and graft: Secondary | ICD-10-CM

## 2022-03-08 NOTE — Progress Notes (Signed)
Daily Session Note  Patient Details  Name: [name] MRN: [id number] Date of Birth: [birth year] Referring Provider:   Flowsheet Row Cardiac Rehab from 02/15/2022 in [facility] Cardiac and Pulmonary Rehab  Referring Provider End       Encounter Date: 03/08/2022  Check In:      Social History   Tobacco Use  Smoking Status Never  Smokeless Tobacco Never    Goals Met:  Independence with exercise equipment Exercise tolerated well No report of concerns or symptoms today  Goals Unmet:  Not Applicable  Comments: Pt able to follow exercise prescription today without complaint.  Will continue to monitor for progression.    Dr. Emily Filbert is Medical Director for [place].  Dr. Ottie Glazier is Medical Director for [facility] Pulmonary Rehabilitation.

## 2022-03-10 ENCOUNTER — Telehealth: Payer: Self-pay | Admitting: *Deleted

## 2022-03-10 ENCOUNTER — Encounter: Payer: Self-pay | Admitting: *Deleted

## 2022-03-10 DIAGNOSIS — Z955 Presence of coronary angioplasty implant and graft: Secondary | ICD-10-CM | POA: Diagnosis not present

## 2022-03-10 DIAGNOSIS — I252 Old myocardial infarction: Secondary | ICD-10-CM | POA: Diagnosis not present

## 2022-03-10 DIAGNOSIS — I2510 Atherosclerotic heart disease of native coronary artery without angina pectoris: Secondary | ICD-10-CM | POA: Diagnosis not present

## 2022-03-10 DIAGNOSIS — I213 ST elevation (STEMI) myocardial infarction of unspecified site: Secondary | ICD-10-CM

## 2022-03-10 DIAGNOSIS — Z5189 Encounter for other specified aftercare: Secondary | ICD-10-CM | POA: Diagnosis not present

## 2022-03-10 NOTE — Telephone Encounter (Signed)
-----   Message from Abbie Sons, MD sent at 03/09/2022  9:26 PM EDT ----- CT showed no findings that would explain the visible blood in his urine.  Keep scheduled cystoscopy appointment for lower tract evaluation

## 2022-03-10 NOTE — Progress Notes (Signed)
Daily Session Note  Patient Details  Name: CHANCELOR HARDRICK MRN: [id number] Date of Birth: [birth year] Referring Provider:   Flowsheet Row Cardiac Rehab from 02/15/2022 in [facility] Cardiac and Pulmonary Rehab  Referring Provider End       Encounter Date: 03/10/2022  Check In:  Session Check In - 03/10/22 1539       Check-In   Supervising physician immediately available to respond to emergencies See telemetry face sheet for immediately available ER MD    Location [facility]-Cardiac & Pulmonary Rehab    Staff Present Antionette Fairy, BS, Exercise Physiologist;Laureen Owens Shark, BS, RRT, CPFT;Susanne Bice, RN, BSN, Jacklynn Bue, MS, ASCM CEP, Exercise Physiologist    Virtual Visit No    Medication changes reported     No    Fall or balance concerns reported    No    Warm-up and Cool-down Performed on first and last piece of equipment    Resistance Training Performed Yes    VAD Patient? No    PAD/SET Patient? No      Pain Assessment   Currently in Pain? No/denies    Multiple Pain Sites No                Social History   Tobacco Use  Smoking Status Never  Smokeless Tobacco Never    Goals Met:  Independence with exercise equipment Exercise tolerated well No report of concerns or symptoms today  Goals Unmet:  Not Applicable  Comments: Pt able to follow exercise prescription today without complaint.  Will continue to monitor for progression.    Dr. Emily Filbert is Medical Director for [place].  Dr. Ottie Glazier is Medical Director for [facility] Pulmonary Rehabilitation.

## 2022-03-10 NOTE — Telephone Encounter (Signed)
Pt returned call and I read message

## 2022-03-10 NOTE — Progress Notes (Signed)
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 energy expenditure required for the weight goal.;Weight Management: Provide education and appropriate resources to help participant work on and attain dietary goals.;Weight Management/Obesity: Establish reasonable short term and long term weight goals.    Admit Weight 178 lb 6.4 oz (80.9 kg)    Goal Weight: Short Term 178 lb 6.4 oz (80.9 kg)    Goal Weight: Long Term 178 lb 6.4 oz (80.9 kg)    Expected Outcomes Short Term: Continue to assess and modify interventions until short term weight is achieved;Long Term: Adherence to nutrition and physical activity/exercise program aimed toward attainment of established weight goal;Weight Maintenance: Understanding of the daily nutrition guidelines, which includes 25-35% calories from fat, 7% or less cal from saturated fats, less than 257m  cholesterol, less than 1.5gm of sodium, & 5 or more servings of fruits and vegetables daily;Understanding recommendations for meals to include 15-35% energy as protein, 25-35% energy from fat, 35-60% energy from carbohydrates, less than 2068mof dietary cholesterol, 20-35 gm of total fiber daily;Understanding of distribution of calorie intake throughout the day with the consumption of 4-5 meals/snacks    Heart Failure Yes    Intervention Provide a combined exercise and nutrition program that is supplemented with education, support and counseling about heart failure. Directed toward relieving symptoms such as shortness of breath, decreased exercise tolerance, and  extremity edema.    Expected Outcomes Improve functional capacity of life;Short term: Attendance in program 2-3 days a week with increased exercise capacity. Reported lower sodium intake. Reported increased fruit and vegetable intake. Reports medication compliance.;Short term: Daily weights obtained and reported for increase. Utilizing diuretic protocols set by physician.;Long term: Adoption of self-care skills and reduction of barriers for early signs and symptoms recognition and intervention leading to self-care maintenance.    Hypertension Yes    Intervention Provide education on lifestyle modifcations including regular physical activity/exercise, weight management, moderate sodium restriction and increased consumption of fresh fruit, vegetables, and low fat dairy, alcohol moderation, and smoking cessation.;Monitor prescription use compliance.    Expected Outcomes Short Term: Continued assessment and intervention until BP is < 140/90mm HG in hypertensive participants. < 130/80mm HG in hypertensive participants with diabetes, heart failure or chronic kidney disease.;Long Term: Maintenance of blood pressure at goal levels.    Lipids Yes    Intervention Provide education and support for participant on nutrition & aerobic/resistive exercise along with  prescribed medications to achieve LDL <70mg, HDL >40mg.    Expected Outcomes Short Term: Participant states understanding of desired cholesterol values and is compliant with medications prescribed. Participant is following exercise prescription and nutrition guidelines.;Long Term: Cholesterol controlled with medications as prescribed, with individualized exercise RX and with personalized nutrition plan. Value goals: LDL < 70mg, HDL > 40 mg.             Education:Diabetes - Individual verbal and written instruction to review signs/symptoms of diabetes, desired ranges of glucose level fasting, after meals and with exercise. Acknowledge that pre and post exercise glucose checks will be done for 3 sessions at entry of program.   Core Components/Risk Factors/Patient Goals Review:    Core Components/Risk Factors/Patient Goals at Discharge (Final Review):    ITP Comments:  ITP Comments     Row Name 02/01/22 1355 02/15/22 1530 02/18/22 1531 03/10/22 0756     ITP Comments Virtual orientation call completed today. he has an appointment on Date: 02/15/2022  for EP eval and gym Orientation.  Documentation of diagnosis can be found in [facility]  Date: 12/23/2025 . Completed 6MWT and gym orientation. Initial ITP created and sent for review to Dr. Mark Miller, Medical Director. First full day of exercise!  Patient was oriented to gym and equipment including functions, settings, policies, and procedures.  Patient's individual exercise prescription and treatment plan were reviewed.  All starting workloads were established based on the results of the 6 minute walk test done at initial orientation visit.  The plan for exercise progression was also introduced and progression will be customized based on patient's performance and goals. 30 Day review completed. Medical Director ITP review done, changes made as directed, and signed approval by Medical Director.    NEW             Comments:  

## 2022-03-11 DIAGNOSIS — I213 ST elevation (STEMI) myocardial infarction of unspecified site: Secondary | ICD-10-CM

## 2022-03-11 DIAGNOSIS — I252 Old myocardial infarction: Secondary | ICD-10-CM | POA: Diagnosis not present

## 2022-03-11 DIAGNOSIS — I2510 Atherosclerotic heart disease of native coronary artery without angina pectoris: Secondary | ICD-10-CM | POA: Diagnosis not present

## 2022-03-11 DIAGNOSIS — Z955 Presence of coronary angioplasty implant and graft: Secondary | ICD-10-CM

## 2022-03-11 DIAGNOSIS — Z5189 Encounter for other specified aftercare: Secondary | ICD-10-CM | POA: Diagnosis not present

## 2022-03-11 NOTE — Progress Notes (Signed)
Daily Session Note  Patient Details  Name: [name] MRN: [id number] Date of Birth: 1935/02/18 Referring Provider:   Flowsheet Row Cardiac Rehab from 02/15/2022 in [facility] Cardiac and Pulmonary Rehab  Referring Provider End       Encounter Date: 03/11/2022  Check In:  Session Check In - 03/11/22 1547       Check-In   Supervising physician immediately available to respond to emergencies See telemetry face sheet for immediately available ER MD    Location [facility]-Cardiac & Pulmonary Rehab    Staff Present Alberteen Sam, MA, RCEP, CCRP, Marylynn Pearson, MS, ASCM CEP, Exercise Physiologist;Olney Monier, RN, BSN    Virtual Visit No    Medication changes reported     No    Fall or balance concerns reported    No    Tobacco Cessation No Change    Warm-up and Cool-down Performed on first and last piece of equipment    Resistance Training Performed Yes    VAD Patient? No    PAD/SET Patient? No      Pain Assessment   Currently in Pain? No/denies                Social History   Tobacco Use  Smoking Status Never  Smokeless Tobacco Never    Goals Met:  Independence with exercise equipment Exercise tolerated well No report of concerns or symptoms today Strength training completed today  Goals Unmet:  Not Applicable  Comments: Pt able to follow exercise prescription today without complaint.  Will continue to monitor for progression.   Dr. Emily Filbert is Medical Director for [place].  Dr. Ottie Glazier is Medical Director for [facility].

## 2022-03-15 ENCOUNTER — Encounter: Payer: Medicare Other | Admitting: *Deleted

## 2022-03-15 DIAGNOSIS — Z955 Presence of coronary angioplasty implant and graft: Secondary | ICD-10-CM | POA: Diagnosis not present

## 2022-03-15 DIAGNOSIS — Z5189 Encounter for other specified aftercare: Secondary | ICD-10-CM | POA: Diagnosis not present

## 2022-03-15 DIAGNOSIS — I213 ST elevation (STEMI) myocardial infarction of unspecified site: Secondary | ICD-10-CM

## 2022-03-15 DIAGNOSIS — I252 Old myocardial infarction: Secondary | ICD-10-CM | POA: Diagnosis not present

## 2022-03-15 DIAGNOSIS — I2510 Atherosclerotic heart disease of native coronary artery without angina pectoris: Secondary | ICD-10-CM | POA: Diagnosis not present

## 2022-03-15 NOTE — Progress Notes (Signed)
Daily Session Note  Patient Details  Name: [name] MRN: [id number] Date of Birth: Sep 12, 1934 Referring Provider:   Flowsheet Row Cardiac Rehab from 02/15/2022 in [facility] Cardiac and Pulmonary Rehab  Referring Provider End       Encounter Date: 03/15/2022  Check In:  Session Check In - 03/15/22 1558       Check-In   Supervising physician immediately available to respond to emergencies See telemetry face sheet for immediately available ER MD    Location [facility]    Staff Present Coralie Keens, MS, ASCM CEP, Exercise Physiologist;Ellisyn Icenhower Sherryll Burger, RN BSN;Noah Tickle, BS, Exercise Physiologist    Virtual Visit No    Medication changes reported     No    Fall or balance concerns reported    No    Warm-up and Cool-down Performed on first and last piece of equipment    Resistance Training Performed Yes    VAD Patient? No    PAD/SET Patient? No      Pain Assessment   Currently in Pain? No/denies                Social History   Tobacco Use  Smoking Status Never  Smokeless Tobacco Never    Goals Met:  Independence with exercise equipment Exercise tolerated well Personal goals reviewed No report of concerns or symptoms today Strength training completed today  Goals Unmet:  Not Applicable  Comments: Pt able to follow exercise prescription today without complaint.  Will continue to monitor for progression.    Dr. Emily Filbert is Medical Director for [place].  Dr. Ottie Glazier is Medical Director for [facility] Pulmonary Rehabilitation.

## 2022-03-17 ENCOUNTER — Encounter: Payer: Medicare Other | Admitting: *Deleted

## 2022-03-17 DIAGNOSIS — I213 ST elevation (STEMI) myocardial infarction of unspecified site: Secondary | ICD-10-CM

## 2022-03-17 DIAGNOSIS — Z955 Presence of coronary angioplasty implant and graft: Secondary | ICD-10-CM | POA: Diagnosis not present

## 2022-03-17 DIAGNOSIS — I252 Old myocardial infarction: Secondary | ICD-10-CM | POA: Diagnosis not present

## 2022-03-17 DIAGNOSIS — Z5189 Encounter for other specified aftercare: Secondary | ICD-10-CM | POA: Diagnosis not present

## 2022-03-17 DIAGNOSIS — I2510 Atherosclerotic heart disease of native coronary artery without angina pectoris: Secondary | ICD-10-CM | POA: Diagnosis not present

## 2022-03-17 NOTE — Progress Notes (Signed)
Daily Session Note  Patient Details  Name: [name] MRN: [id number] Date of Birth: 03/23/1935 Referring Provider:   Flowsheet Row Cardiac Rehab from 02/15/2022 in [facility] Cardiac and Pulmonary Rehab  Referring Provider End       Encounter Date: 03/17/2022  Check In:  Session Check In - 03/17/22 1547       Check-In   Supervising physician immediately available to respond to emergencies See telemetry face sheet for immediately available ER MD    Location [facility]Cardiac & Pulmonary Rehab    Staff Present Melissa Caiola, RDN, LDN;Kara Langdon, MS, ASCM CEP, Exercise Physiologist;Meredith Craven, RN BSN    Virtual Visit No    Medication changes reported     No    Fall or balance concerns reported    No    Warm-up and Cool-down Performed on first and last piece of equipment    Resistance Training Performed Yes    VAD Patient? No    PAD/SET Patient? No      Pain Assessment   Currently in Pain? No/denies                Social History   Tobacco Use  Smoking Status Never  Smokeless Tobacco Never    Goals Met:  Independence with exercise equipment Exercise tolerated well No report of concerns or symptoms today Strength training completed today  Goals Unmet:  Not Applicable  Comments: Pt able to follow exercise prescription today without complaint.  Will continue to monitor for progression.    Dr. Mark Miller is Medical Director for [facility].  Dr. Fuad Aleskerov is Medical Director for [facility]. 

## 2022-03-18 ENCOUNTER — Encounter: Payer: Medicare Other | Admitting: *Deleted

## 2022-03-18 DIAGNOSIS — Z955 Presence of coronary angioplasty implant and graft: Secondary | ICD-10-CM

## 2022-03-18 DIAGNOSIS — I2510 Atherosclerotic heart disease of native coronary artery without angina pectoris: Secondary | ICD-10-CM | POA: Diagnosis not present

## 2022-03-18 DIAGNOSIS — Z5189 Encounter for other specified aftercare: Secondary | ICD-10-CM | POA: Diagnosis not present

## 2022-03-18 DIAGNOSIS — I213 ST elevation (STEMI) myocardial infarction of unspecified site: Secondary | ICD-10-CM

## 2022-03-18 DIAGNOSIS — I252 Old myocardial infarction: Secondary | ICD-10-CM | POA: Diagnosis not present

## 2022-03-18 NOTE — Progress Notes (Signed)
Daily Session Note  Patient Details  Name: [name] MRN: [id number] Date of Birth: 1934/10/09 Referring Provider:   Flowsheet Row Cardiac Rehab from 02/15/2022 in [facility] Cardiac and Pulmonary Rehab  Referring Provider End       Encounter Date: 03/18/2022  Check In:  Session Check In - 03/18/22 1538       Check-In   Supervising physician immediately available to respond to emergencies See telemetry face sheet for immediately available ER MD    Location [facility]-Cardiac & Pulmonary Rehab    Staff Present Vida Rigger, RN, BSN;Geet Hosking Luan Pulling, MA, RCEP, CCRP, CCET;Meredith Sherryll Burger, RN BSN    Virtual Visit No    Medication changes reported     No    Fall or balance concerns reported    No    Warm-up and Cool-down Performed on first and last piece of equipment    Resistance Training Performed Yes    VAD Patient? No    PAD/SET Patient? No      Pain Assessment   Currently in Pain? No/denies                Social History   Tobacco Use  Smoking Status Never  Smokeless Tobacco Never    Goals Met:  Independence with exercise equipment Exercise tolerated well No report of concerns or symptoms today Strength training completed today  Goals Unmet:  Not Applicable  Comments: Pt able to follow exercise prescription today without complaint.  Will continue to monitor for progression.  Reviewed home exercise with pt today.  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  Reviewed THR, pulse, RPE, sign and symptoms, pulse oximetery and when to call 911 or MD.  Also discussed weather considerations and indoor options.  Pt voiced understanding.     Dr. Emily Filbert is Medical Director for [place].  Dr. Ottie Glazier is Medical Director for [facility] Pulmonary Rehabilitation.

## 2022-03-22 ENCOUNTER — Encounter: Payer: Self-pay | Admitting: Urology

## 2022-03-22 ENCOUNTER — Encounter: Payer: Medicare Other | Admitting: *Deleted

## 2022-03-22 ENCOUNTER — Other Ambulatory Visit: Payer: Self-pay | Admitting: Medical

## 2022-03-22 ENCOUNTER — Ambulatory Visit (INDEPENDENT_AMBULATORY_CARE_PROVIDER_SITE_OTHER): Payer: Medicare Other | Admitting: Urology

## 2022-03-22 VITALS — BP 109/65 | HR 94 | Ht 70.0 in | Wt 174.0 lb

## 2022-03-22 DIAGNOSIS — R310 Gross hematuria: Secondary | ICD-10-CM

## 2022-03-22 DIAGNOSIS — Z955 Presence of coronary angioplasty implant and graft: Secondary | ICD-10-CM | POA: Diagnosis not present

## 2022-03-22 DIAGNOSIS — Z5189 Encounter for other specified aftercare: Secondary | ICD-10-CM | POA: Diagnosis not present

## 2022-03-22 DIAGNOSIS — I252 Old myocardial infarction: Secondary | ICD-10-CM | POA: Diagnosis not present

## 2022-03-22 DIAGNOSIS — N3289 Other specified disorders of bladder: Secondary | ICD-10-CM | POA: Diagnosis not present

## 2022-03-22 DIAGNOSIS — I213 ST elevation (STEMI) myocardial infarction of unspecified site: Secondary | ICD-10-CM

## 2022-03-22 DIAGNOSIS — I2510 Atherosclerotic heart disease of native coronary artery without angina pectoris: Secondary | ICD-10-CM | POA: Diagnosis not present

## 2022-03-22 LAB — MICROSCOPIC EXAMINATION
RBC, Urine: >30 /hpf — AB (ref 0–2)
WBC, UA: >30 /hpf — AB (ref 0–5)

## 2022-03-22 LAB — URINALYSIS, COMPLETE
Bilirubin, UA: NEGATIVE
Glucose, UA: NEGATIVE
Ketones, UA: NEGATIVE
Nitrite, UA: NEGATIVE
Specific Gravity, UA: 1.03 (ref 1.005–1.030)
Urobilinogen, Ur: 1 mg/dL (ref 0.2–1.0)
pH, UA: 5 (ref 5.0–7.5)

## 2022-03-22 NOTE — Progress Notes (Signed)
   03/22/22  CC:  Chief Complaint  Patient presents with   Cysto   Indications: High risk hematuria.  Episode gross hematuria late June 2023 which lasted 3 days.  HPI: Mr. Vanduyne has no complaints today.  He denies recurrent hematuria.  CTU 03/06/2023 with a nonobstructing punctate left lower pole renal calculus.  Scattered subcentimeter hypodense cortical lesions statistically consistent with cysts.  Blood pressure 109/65, pulse 94, height '5\' 10"'$  (1.778 m), weight 174 lb (78.9 kg). NED. A&Ox3.   No respiratory distress   Abd soft, NT, ND Normal phallus with bilateral descended testicles  Cystoscopy Procedure Note  Patient identification was confirmed, informed consent was obtained, and patient was prepped using Betadine solution.  Lidocaine jelly was administered per urethral meatus.     Pre-Procedure: - Inspection reveals a normal caliber urethral meatus.  Procedure: The flexible cystoscope was introduced without difficulty - No urethral strictures/lesions are present. -  Prominent lateral lobe enlargement  prostate with hypervascularity - Elevated bladder neck, mild - Bilateral ureteral orifices identified - Bladder mucosa  reveals mild posterior wall erythema - No bladder stones -Mild trabeculation with scattered diverticuli which are free of tumor  Retroflexion shows no abnormalities   Post-Procedure: - Patient tolerated the procedure well  Assessment/ Plan: Mild posterior wall erythema.  A urine cytology was ordered. Results were discussed.  Would recommend bladder biopsy if cytology abnormal    Abbie Sons, MD

## 2022-03-22 NOTE — Progress Notes (Signed)
Daily Session Note  Patient Details  Name: DARIK MASSING MRN: [id number] Date of Birth: [birth year] Referring Provider:   Flowsheet Row Cardiac Rehab from 02/15/2022 in [facility] Cardiac and Pulmonary Rehab  Referring Provider End       Encounter Date: 03/22/2022  Check In:  Session Check In - 03/22/22 1538       Check-In   Supervising physician immediately available to respond to emergencies See telemetry face sheet for immediately available ER MD    Location [facility] Re[facility]    Staff Present Renita Papa, RN BSN;Joseph Tessie Fass, RCP,RRT,BSRT;Melissa [place], [place], LDN    Virtual Visit No    Medication changes reported     No    Fall or balance concerns reported    No    Warm-up and Cool-down Performed on first and last piece of equipment    Resistance Training Performed Yes    VAD Patient? No    PAD/SET Patient? No      Pain Assessment   Currently in Pain? No/denies                Social History   Tobacco Use  Smoking Status Never  Smokeless Tobacco Never    Goals Met:  Independence with exercise equipment Exercise tolerated well No report of concerns or symptoms today Strength training completed today  Goals Unmet:  Not Applicable  Comments: Pt able to follow exercise prescription today without complaint.  Will continue to monitor for progression.    Dr. Emily Filbert is Medical Director for [place].  Dr. Ottie Glazier is Medical Director for [facility] Pulmonary Rehabilitation.

## 2022-03-22 NOTE — Addendum Note (Signed)
Addended by: Chrystie Nose on: 03/22/2022 10:16 AM   Modules accepted: Orders

## 2022-03-24 ENCOUNTER — Encounter: Payer: Medicare Other | Admitting: *Deleted

## 2022-03-24 DIAGNOSIS — Z5189 Encounter for other specified aftercare: Secondary | ICD-10-CM | POA: Diagnosis not present

## 2022-03-24 DIAGNOSIS — I213 ST elevation (STEMI) myocardial infarction of unspecified site: Secondary | ICD-10-CM

## 2022-03-24 DIAGNOSIS — Z955 Presence of coronary angioplasty implant and graft: Secondary | ICD-10-CM

## 2022-03-24 DIAGNOSIS — I2510 Atherosclerotic heart disease of native coronary artery without angina pectoris: Secondary | ICD-10-CM | POA: Diagnosis not present

## 2022-03-24 DIAGNOSIS — I252 Old myocardial infarction: Secondary | ICD-10-CM | POA: Diagnosis not present

## 2022-03-24 LAB — CYTOLOGY - NON PAP

## 2022-03-24 NOTE — Progress Notes (Signed)
Daily Session Note  Patient Details  Name: [name] MRN: [id number] Date of Birth: 09/14/1934 Referring Provider:   Flowsheet Row Cardiac Rehab from 02/15/2022 in [facility] Cardiac and Pulmonary Rehab  Referring Provider End       Encounter Date: 03/24/2022  Check In:  Session Check In - 03/24/22 1538       Check-In   Supervising physician immediately available to respond to emergencies See telemetry face sheet for immediately available ER MD    Location [facility] Re[facility]    Staff Present Melissa Caiola, RDN, LDN;Joseph Hood, RCP,RRT,BSRT;Meredith Craven, RN BSN    Virtual Visit No    Medication changes reported     No    Fall or balance concerns reported    No    Warm-up and Cool-down Performed on first and last piece of equipment    Resistance Training Performed Yes    VAD Patient? No    PAD/SET Patient? No      Pain Assessment   Currently in Pain? No/denies                Social History   Tobacco Use  Smoking Status Never  Smokeless Tobacco Never    Goals Met:  Independence with exercise equipment Exercise tolerated well No report of concerns or symptoms today Strength training completed today  Goals Unmet:  Not Applicable  Comments: Pt able to follow exercise prescription today without complaint.  Will continue to monitor for progression.    Dr. Mark Miller is Medical Director for [facility].  Dr. Fuad Aleskerov is Medical Director for [facility]. 

## 2022-03-25 ENCOUNTER — Other Ambulatory Visit: Payer: Self-pay | Admitting: *Deleted

## 2022-03-25 ENCOUNTER — Telehealth: Payer: Self-pay | Admitting: Urology

## 2022-03-25 ENCOUNTER — Encounter: Payer: Medicare Other | Admitting: *Deleted

## 2022-03-25 DIAGNOSIS — Z955 Presence of coronary angioplasty implant and graft: Secondary | ICD-10-CM

## 2022-03-25 DIAGNOSIS — I2510 Atherosclerotic heart disease of native coronary artery without angina pectoris: Secondary | ICD-10-CM | POA: Diagnosis not present

## 2022-03-25 DIAGNOSIS — R310 Gross hematuria: Secondary | ICD-10-CM

## 2022-03-25 DIAGNOSIS — Z5189 Encounter for other specified aftercare: Secondary | ICD-10-CM | POA: Diagnosis not present

## 2022-03-25 DIAGNOSIS — I213 ST elevation (STEMI) myocardial infarction of unspecified site: Secondary | ICD-10-CM

## 2022-03-25 DIAGNOSIS — I252 Old myocardial infarction: Secondary | ICD-10-CM | POA: Diagnosis not present

## 2022-03-25 LAB — CULTURE, URINE COMPREHENSIVE

## 2022-03-25 MED ORDER — CIPROFLOXACIN HCL 250 MG PO TABS
250.0000 mg | ORAL_TABLET | Freq: Two times a day (BID) | ORAL | 0 refills | Status: DC
Start: 2022-03-25 — End: 2022-05-02

## 2022-03-25 NOTE — Telephone Encounter (Signed)
Patient and wife notified, abx sent into pharmacy and lab apt made for repeat cytology order placed

## 2022-03-25 NOTE — Telephone Encounter (Signed)
Urine cytology showed atypical cells however urine culture also showing bacteria.  Please send in Rx Cipro 250 mg twice daily x7 days.  Repeat urinalysis in 2 weeks.  If urinalysis is clear we will repeat urine cytology

## 2022-03-25 NOTE — Progress Notes (Signed)
Daily Session Note  Patient Details  Name: JHETT FRETWELL MRN: [id number] Date of Birth: [birth year] Referring Provider:   Flowsheet Row Cardiac Rehab from 02/15/2022 in [facility] Cardiac and Pulmonary Rehab  Referring Provider End       Encounter Date: 03/25/2022  Check In:  Session Check In - 03/25/22 1548       Check-In   Supervising physician immediately available to respond to emergencies See telemetry face sheet for immediately available ER MD    Location [facility] Re[facility]    Staff Present Renita Papa, RN BSN;Laureen Owens Shark, BS, RRT, CPFT;Joseph Tessie Fass, [place]    Virtual Visit No    Medication changes reported     No    Fall or balance concerns reported    No    Warm-up and Cool-down Performed on first and last piece of equipment    Resistance Training Performed Yes    VAD Patient? No    PAD/SET Patient? No      Pain Assessment   Currently in Pain? No/denies                Social History   Tobacco Use  Smoking Status Never  Smokeless Tobacco Never    Goals Met:  Independence with exercise equipment Exercise tolerated well No report of concerns or symptoms today Strength training completed today  Goals Unmet:  Not Applicable  Comments: Pt able to follow exercise prescription today without complaint.  Will continue to monitor for progression.    Dr. Emily Filbert is Medical Director for [place].  Dr. Ottie Glazier is Medical Director for [facility] Pulmonary Rehabilitation.

## 2022-03-29 ENCOUNTER — Encounter: Payer: Medicare Other | Admitting: *Deleted

## 2022-03-29 DIAGNOSIS — Z955 Presence of coronary angioplasty implant and graft: Secondary | ICD-10-CM | POA: Diagnosis not present

## 2022-03-29 DIAGNOSIS — I252 Old myocardial infarction: Secondary | ICD-10-CM | POA: Diagnosis not present

## 2022-03-29 DIAGNOSIS — I2510 Atherosclerotic heart disease of native coronary artery without angina pectoris: Secondary | ICD-10-CM | POA: Diagnosis not present

## 2022-03-29 DIAGNOSIS — I213 ST elevation (STEMI) myocardial infarction of unspecified site: Secondary | ICD-10-CM

## 2022-03-29 DIAGNOSIS — Z5189 Encounter for other specified aftercare: Secondary | ICD-10-CM | POA: Diagnosis not present

## 2022-03-29 NOTE — Progress Notes (Signed)
Daily Session Note  Patient Details  Name: [name] MRN: [id number] Date of Birth: 01/23/1935 Referring Provider:   Flowsheet Row Cardiac Rehab from 02/15/2022 in [facility] Cardiac and Pulmonary Rehab  Referring Provider End       Encounter Date: 03/29/2022  Check In:  Session Check In - 03/29/22 1543       Check-In   Supervising physician immediately available to respond to emergencies See telemetry face sheet for immediately available ER MD    Location [facility] Pulmonary Rehab    Staff Present Meredith Craven, RN BSN;Joseph Hood, RCP,RRT,BSRT;Melissa Caiola, RDN, LDN    Virtual Visit No    Medication changes reported     No    Fall or balance concerns reported    No    Warm-up and Cool-down Performed on first and last piece of equipment    Resistance Training Performed Yes    VAD Patient? No    PAD/SET Patient? No      Pain Assessment   Currently in Pain? No/denies                Social History   Tobacco Use  Smoking Status Never  Smokeless Tobacco Never    Goals Met:  Independence with exercise equipment Exercise tolerated well No report of concerns or symptoms today Strength training completed today  Goals Unmet:  Not Applicable  Comments: Pt able to follow exercise prescription today without complaint.  Will continue to monitor for progression.    Dr. Mark Miller is Medical Director for [facility].  Dr. Fuad Aleskerov is Medical Director for [facility]. 

## 2022-03-31 ENCOUNTER — Encounter: Payer: Medicare Other | Admitting: *Deleted

## 2022-03-31 DIAGNOSIS — Z955 Presence of coronary angioplasty implant and graft: Secondary | ICD-10-CM | POA: Diagnosis not present

## 2022-03-31 DIAGNOSIS — I213 ST elevation (STEMI) myocardial infarction of unspecified site: Secondary | ICD-10-CM

## 2022-03-31 DIAGNOSIS — Z5189 Encounter for other specified aftercare: Secondary | ICD-10-CM | POA: Diagnosis not present

## 2022-03-31 DIAGNOSIS — I252 Old myocardial infarction: Secondary | ICD-10-CM | POA: Diagnosis not present

## 2022-03-31 DIAGNOSIS — I2510 Atherosclerotic heart disease of native coronary artery without angina pectoris: Secondary | ICD-10-CM | POA: Diagnosis not present

## 2022-03-31 NOTE — Progress Notes (Signed)
Daily Session Note  Patient Details  Name: [name] MRN: [id number] Date of Birth: [birth year] Referring Provider:   Flowsheet Row Cardiac Rehab from 02/15/2022 in [facility] Cardiac and Pulmonary Rehab  Referring Provider End       Encounter Date: 03/31/2022  Check In:  Session Check In - 03/31/22 1539       Check-In   Supervising physician immediately available to respond to emergencies See telemetry face sheet for immediately available ER MD    Location [facility]-Card[facility] & Pulmonary Rehab    Staff Present Renita Papa, RN BSN;Joseph Tessie Fass, RCP,RRT,BSRT;Melissa [place], [place], LDN    Virtual Visit No    Medication changes reported     No    Fall or balance concerns reported    No    Warm-up and Cool-down Performed on first and last piece of equipment    Resistance Training Performed Yes    VAD Patient? No    PAD/SET Patient? No      Pain Assessment   Currently in Pain? No/denies                Social History   Tobacco Use  Smoking Status Never  Smokeless Tobacco Never    Goals Met:  Independence with exercise equipment Exercise tolerated well No report of concerns or symptoms today Strength training completed today  Goals Unmet:  Not Applicable  Comments: Pt able to follow exercise prescription today without complaint.  Will continue to monitor for progression.    Dr. Emily Filbert is Medical Director for [place].  Dr. Ottie Glazier is Medical Director for [facility] Pulmonary Rehabilitation.

## 2022-04-01 ENCOUNTER — Encounter: Payer: Medicare Other | Admitting: *Deleted

## 2022-04-01 DIAGNOSIS — Z955 Presence of coronary angioplasty implant and graft: Secondary | ICD-10-CM

## 2022-04-01 DIAGNOSIS — Z5189 Encounter for other specified aftercare: Secondary | ICD-10-CM | POA: Diagnosis not present

## 2022-04-01 DIAGNOSIS — I2510 Atherosclerotic heart disease of native coronary artery without angina pectoris: Secondary | ICD-10-CM | POA: Diagnosis not present

## 2022-04-01 DIAGNOSIS — I213 ST elevation (STEMI) myocardial infarction of unspecified site: Secondary | ICD-10-CM

## 2022-04-01 DIAGNOSIS — I252 Old myocardial infarction: Secondary | ICD-10-CM | POA: Diagnosis not present

## 2022-04-01 NOTE — Progress Notes (Signed)
Daily Session Note  Patient Details  Name: [name] MRN: [id number] Date of Birth: 1934-09-08 Referring Provider:   Flowsheet Row Cardiac Rehab from 02/15/2022 in [facility] Cardiac and Pulmonary Rehab  Referring Provider End       Encounter Date: 04/01/2022  Check In:  Session Check In - 04/01/22 1549       Check-In   Supervising physician immediately available to respond to emergencies See telemetry face sheet for immediately available ER MD    Location [facility]-Card[facility] Pulmonary Rehab    Staff Present Justin Mend, RCP,RRT,BSRT;[place] Maiolo Sherryll Burger, RN BSN;Jessica Luan Pulling, MA, RCEP, CCRP, CCET    Virtual Visit No    Medication changes reported     No    Fall or balance concerns reported    No    Warm-up and Cool-down Performed on first and last piece of equipment    Resistance Training Performed Yes    VAD Patient? No    PAD/SET Patient? No      Pain Assessment   Currently in Pain? No/denies                Social History   Tobacco Use  Smoking Status Never  Smokeless Tobacco Never    Goals Met:  Independence with exercise equipment Exercise tolerated well No report of concerns or symptoms today Strength training completed today  Goals Unmet:  Not Applicable  Comments: Pt able to follow exercise prescription today without complaint.  Will continue to monitor for progression.    Dr. Emily Filbert is Medical Director for [place].  Dr. Ottie Glazier is Medical Director for [facility] Pulmonary Rehabilitation.

## 2022-04-07 ENCOUNTER — Encounter: Payer: Medicare Other | Attending: Internal Medicine | Admitting: *Deleted

## 2022-04-07 DIAGNOSIS — Z955 Presence of coronary angioplasty implant and graft: Secondary | ICD-10-CM | POA: Insufficient documentation

## 2022-04-07 DIAGNOSIS — I213 ST elevation (STEMI) myocardial infarction of unspecified site: Secondary | ICD-10-CM | POA: Insufficient documentation

## 2022-04-07 DIAGNOSIS — Z48812 Encounter for surgical aftercare following surgery on the circulatory system: Secondary | ICD-10-CM | POA: Diagnosis not present

## 2022-04-07 NOTE — Progress Notes (Signed)
30 Day review completed. Medical Director ITP review done, changes made as directed, and signed approval by Medical Director.  

## 2022-04-07 NOTE — Progress Notes (Signed)
Daily Session Note  Patient Details  Name: RIP HAWES MRN: [id number] Date of Birth: 12/14/1934 Referring Provider:   Flowsheet Row Cardiac Rehab from 02/15/2022 in [facility] Card[facility]  Referring Provider End       Encounter Date: 04/07/2022  Check In:  Session Check In - 04/07/22 1545       Check-In   Supervising physician immediately available to respond to emergencies See telemetry face sheet for immediately available ER MD    Location [facility]-Cardiac & Pulmonary Rehab    Staff Present Justin Mend, RCP,RRT,BSRT;Hellen Shanley Sherryll Burger, RN BSN;Laureen Owens Shark, BS, RRT, CPFT    Virtual Visit No    Medication changes reported     No    Fall or balance concerns reported    No    Warm-up and Cool-down Performed on first and last piece of equipment    Resistance Training Performed Yes    VAD Patient? No    PAD/SET Patient? No      Pain Assessment   Currently in Pain? No/denies                Social History   Tobacco Use  Smoking Status Never  Smokeless Tobacco Never    Goals Met:  Independence with exercise equipment Exercise tolerated well No report of concerns or symptoms today Strength training completed today  Goals Unmet:  Not Applicable  Comments: Pt able to follow exercise prescription today without complaint.  Will continue to monitor for progression.    Dr. Emily Filbert is Medical Director for [facility].  Dr. Ottie Glazier is Medical Director for [facility] Pulmonary Rehabilitation.

## 2022-04-07 NOTE — Progress Notes (Signed)
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 that is a hard thing to do.    Expected Outcomes Short: continue to attend cardiac rehab consistenly for mental health benefits. Long: mantain positive mental health habits.    Continue Psychosocial Services  Follow up required by staff             Vocational Rehabilitation: Provide vocational rehab assistance to qualifying candidates.   Vocational Rehab Evaluation & Intervention:   Education: Education Goals: Education classes will be provided on a variety of topics geared toward better understanding of heart health and risk factor modification. Participant will state understanding/return demonstration of topics presented as noted  by education test scores.  Learning Barriers/Preferences:   General Cardiac Education Topics:  AED/CPR: - Group  verbal and written instruction with the use of models to demonstrate the basic use of the AED with the basic ABC's of resuscitation.   Anatomy and Cardiac Procedures: - Group verbal and visual presentation and models provide information about basic cardiac anatomy and function. Reviews the testing methods done to diagnose heart disease and the outcomes of the test results. Describes the treatment choices: Medical Management, Angioplasty, or Coronary Bypass Surgery for treating various heart conditions including Myocardial Infarction, Angina, Valve Disease, and Cardiac Arrhythmias.  Written material given at graduation.   Medication Safety: - Group verbal and visual instruction to review commonly prescribed medications for heart and lung disease. Reviews the medication, class of the drug, and side effects. Includes the steps to properly store meds and maintain the prescription regimen.  Written material given at graduation.   Intimacy: - Group verbal instruction through game format to discuss how heart and lung disease can affect sexual intimacy. Written material given at graduation..   Know Your Numbers and Heart Failure: - Group verbal and visual instruction to discuss disease risk factors for cardiac and pulmonary disease and treatment options.  Reviews associated critical values for Overweight/Obesity, Hypertension, Cholesterol, and Diabetes.  Discusses basics of heart failure: signs/symptoms and treatments.  Introduces Heart Failure Zone chart for action plan for heart failure.  Written material given at graduation.   Infection Prevention: - Provides verbal and written material to individual with discussion of infection control including proper hand washing and proper equipment cleaning during exercise session. Flowsheet Row Cardiac Rehab from 02/24/2022 in [facility] Cardiac and  Pulmonary Rehab  Date 02/15/22  Educator [facility]  Instruction Review Code 1- Verbalizes Understanding       Falls Prevention: - Provides verbal and written material to individual with discussion of falls prevention and safety. Flowsheet Row Cardiac Rehab from 02/24/2022 in [facility] Cardiac and Pulmonary Rehab  Date 02/15/22  Educator [facility]  Instruction Review Code 1- Verbalizes Understanding       Other: -Provides group and verbal instruction on various topics (see comments)   Knowledge Questionnaire Score:  Knowledge Questionnaire Score - 03/29/22 1541       Knowledge Questionnaire Score   Post Score --             Core Components/Risk Factors/Patient Goals at Admission:  Personal Goals and Risk Factors at Admission - 02/15/22 1541       Core Components/Risk Factors/Patient Goals on Admission    Weight Management Yes    Intervention Weight Management: Develop a combined nutrition and exercise program designed to reach desired caloric intake, while maintaining appropriate intake of nutrient and fiber, sodium and fats, and appropriate energy expenditure required for the weight goal.;Weight Management: Provide education and appropriate resources to help participant work on and attain dietary goals.;Weight Management/Obesity: Establish reasonable short term and long term weight goals.    Admit Weight 178 lb 6.4 oz (80.9 kg)    Goal Weight: Short Term 178 lb 6.4 oz (80.9 kg)    Goal Weight: Long Term 178 lb 6.4 oz (80.9 kg)    Expected Outcomes Short Term: Continue to assess and modify interventions until short term weight is achieved;Long Term: Adherence to nutrition and physical activity/exercise program aimed toward attainment of established weight goal;Weight Maintenance: Understanding of the daily nutrition guidelines, which includes 25-35% calories from fat, 7% or less cal from saturated fats, less than 276m cholesterol, less than 1.5gm of sodium, & 5 or more servings of fruits and  vegetables daily;Understanding recommendations for  meals to include 15-35% energy as protein, 25-35% energy from fat, 35-60% energy from carbohydrates, less than 268m of dietary cholesterol, 20-35 gm of total fiber daily;Understanding of distribution of calorie intake throughout the day with the consumption of 4-5 meals/snacks    Heart Failure Yes    Intervention Provide a combined exercise and nutrition program that is supplemented with education, support and counseling about heart failure. Directed toward relieving symptoms such as shortness of breath, decreased exercise tolerance, and extremity edema.    Expected Outcomes Improve functional capacity of life;Short term: Attendance in program 2-3 days a week with increased exercise capacity. Reported lower sodium intake. Reported increased fruit and vegetable intake. Reports medication compliance.;Short term: Daily weights obtained and reported for increase. Utilizing diuretic protocols set by physician.;Long term: Adoption of self-care skills and reduction of barriers for early signs and symptoms recognition and intervention leading to self-care maintenance.    Hypertension Yes    Intervention Provide education on lifestyle modifcations including regular physical activity/exercise, weight management, moderate sodium restriction and increased consumption of fresh fruit, vegetables, and low fat dairy, alcohol moderation, and smoking cessation.;Monitor prescription use compliance.    Expected Outcomes Short Term: Continued assessment and intervention until BP is < 140/979mHG in hypertensive participants. < 130/8019mG in hypertensive participants with diabetes, heart failure or chronic kidney disease.;Long Term: Maintenance of blood pressure at goal levels.    Lipids Yes    Intervention Provide education and support for participant on nutrition & aerobic/resistive exercise along with prescribed medications to achieve LDL <89m72mDL >40mg7m Expected  Outcomes Short Term: Participant states understanding of desired cholesterol values and is compliant with medications prescribed. Participant is following exercise prescription and nutrition guidelines.;Long Term: Cholesterol controlled with medications as prescribed, with individualized exercise RX and with personalized nutrition plan. Value goals: LDL < 89mg,71m > 40 mg.             Education:Diabetes - Individual verbal and written instruction to review signs/symptoms of diabetes, desired ranges of glucose level fasting, after meals and with exercise. Acknowledge that pre and post exercise glucose checks will be done for 3 sessions at entry of program.   Core Components/Risk Factors/Patient Goals Review:   Goals and Risk Factor Review     Row Name 03/15/22 1605             Core Components/Risk Factors/Patient Goals Review   Personal Goals Review Weight Management/Obesity;Heart Failure;Hypertension;Lipids       Review Patient reports that he is taking all medications as prescribed. He reports that he occationally takes his weight at home. While he owns a BP cuff he does not regularly check his BP at home.       Expected Outcomes Short: start to monitor weight and BP at home. Long: maintain heart healthy lifestyle to help control cardiac risk factors.                Core Components/Risk Factors/Patient Goals at Discharge (Final Review):   Goals and Risk Factor Review - 03/15/22 1605       Core Components/Risk Factors/Patient Goals Review   Personal Goals Review Weight Management/Obesity;Heart Failure;Hypertension;Lipids    Review Patient reports that he is taking all medications as prescribed. He reports that he occationally takes his weight at home. While he owns a BP cuff he does not regularly check his BP at home.    Expected Outcomes Short: start to monitor weight and BP at home. Long: maintain heart  healthy lifestyle to help control cardiac risk factors.              ITP Comments:  ITP Comments     Row Name 02/01/22 1355 02/15/22 1530 02/18/22 1531 03/10/22 0756 04/07/22 0830   ITP Comments Virtual orientation call completed today. he has an appointment on Date: 02/15/2022  for EP eval and gym Orientation.  Documentation of diagnosis [facility] be found in [facility]  Date: 12/23/2025 . Completed 6MWT and gym orientation. Initial ITP created and sent for review to Dr. Emily Filbert, Medical Director. First full day of exercise!  Patient was oriented to gym and equipment including functions, settings, policies, and procedures.  Patient's individual exercise prescription and treatment plan were reviewed.  All starting workloads were established based on the results of the 6 minute walk test done at initial orientation visit.  The plan for exercise progression was also introduced and progression will be customized based on patient's performance and goals. 30 Day review completed. Medical Director ITP review done, changes made as directed, and signed approval by Medical Director.    NEW 30 Day review completed. Medical Director ITP review done, changes made as directed, and signed approval by Medical Director.            Comments: 30 day review

## 2022-04-08 ENCOUNTER — Encounter: Payer: Medicare Other | Admitting: *Deleted

## 2022-04-08 DIAGNOSIS — Z48812 Encounter for surgical aftercare following surgery on the circulatory system: Secondary | ICD-10-CM | POA: Diagnosis not present

## 2022-04-08 DIAGNOSIS — I213 ST elevation (STEMI) myocardial infarction of unspecified site: Secondary | ICD-10-CM

## 2022-04-08 DIAGNOSIS — Z955 Presence of coronary angioplasty implant and graft: Secondary | ICD-10-CM | POA: Diagnosis not present

## 2022-04-08 NOTE — Progress Notes (Signed)
Daily Session Note  Patient Details  Name: [name] MRN: [id number] Date of Birth: 1935-01-25 Referring Provider:   Flowsheet Row Cardiac Rehab from 02/15/2022 in [facility] Cardiac and Pulmonary Rehab  Referring Provider End       Encounter Date: 04/08/2022  Check In:  Session Check In - 04/08/22 1533       Check-In   Supervising physician immediately available to respond to emergencies See telemetry face sheet for immediately available ER MD    Location [facility]-Cardiac & Pulmonary Rehab    Staff Present Renita Papa, RN BSN;Joseph Wolford, RCP,RRT,BSRT;Kara [place], [place], ASCM CEP, Exercise Physiologist    Virtual Visit No    Medication changes reported     No    Fall or balance concerns reported    No    Warm-up and Cool-down Performed on first and last piece of equipment    Resistance Training Performed Yes    VAD Patient? No    PAD/SET Patient? No      Pain Assessment   Currently in Pain? No/denies                Social History   Tobacco Use  Smoking Status Never  Smokeless Tobacco Never    Goals Met:  Independence with exercise equipment Exercise tolerated well No report of concerns or symptoms today Strength training completed today  Goals Unmet:  Not Applicable  Comments: Pt able to follow exercise prescription today without complaint.  Will continue to monitor for progression.    Dr. Emily Filbert is Medical Director for [place].  Dr. Ottie Glazier is Medical Director for [facility] Pulmonary Rehabilitation.

## 2022-04-12 ENCOUNTER — Encounter: Payer: Medicare Other | Admitting: *Deleted

## 2022-04-12 ENCOUNTER — Telehealth: Payer: Self-pay | Admitting: Internal Medicine

## 2022-04-12 ENCOUNTER — Other Ambulatory Visit: Payer: Medicare Other

## 2022-04-12 DIAGNOSIS — Z955 Presence of coronary angioplasty implant and graft: Secondary | ICD-10-CM | POA: Diagnosis not present

## 2022-04-12 DIAGNOSIS — R310 Gross hematuria: Secondary | ICD-10-CM

## 2022-04-12 DIAGNOSIS — I213 ST elevation (STEMI) myocardial infarction of unspecified site: Secondary | ICD-10-CM

## 2022-04-12 DIAGNOSIS — Z48812 Encounter for surgical aftercare following surgery on the circulatory system: Secondary | ICD-10-CM | POA: Diagnosis not present

## 2022-04-12 MED ORDER — TICAGRELOR 90 MG PO TABS: 90.0000 mg | ORAL_TABLET | Freq: Two times a day (BID) | ORAL | 0 refills | Status: DC

## 2022-04-12 MED ORDER — LOSARTAN POTASSIUM 25 MG PO TABS
25.0000 mg | ORAL_TABLET | Freq: Every day | ORAL | 0 refills | Status: DC
Start: 2022-04-12 — End: 2022-07-12

## 2022-04-12 MED ORDER — ATORVASTATIN CALCIUM 40 MG PO TABS
40.0000 mg | ORAL_TABLET | Freq: Every day | ORAL | 0 refills | Status: DC
Start: 2022-04-12 — End: 2022-07-12

## 2022-04-12 NOTE — Telephone Encounter (Signed)
Requested Prescriptions   Signed Prescriptions Disp Refills   atorvastatin (LIPITOR) 40 MG tablet 90 tablet 0    Sig: Take 1 tablet (40 mg total) by mouth daily.    Authorizing Provider: END, CHRISTOPHER    Ordering User: Chandlar Staebell C   losartan (COZAAR) 25 MG tablet 90 tablet 0    Sig: Take 1 tablet (25 mg total) by mouth daily.    Authorizing Provider: END, CHRISTOPHER    Ordering User: Othelia Pulling C   ticagrelor (BRILINTA) 90 MG TABS tablet 180 tablet 0    Sig: Take 1 tablet (90 mg total) by mouth 2 (two) times daily.    Authorizing Provider: END, CHRISTOPHER    Ordering User: Britt Bottom

## 2022-04-12 NOTE — Telephone Encounter (Signed)
*  STAT* If patient is at the pharmacy, call can be transferred to refill team.   1. Which medications need to be refilled? (please list name of each medication and dose if known)  losartan 25 MG daily Atorvastatin 40 MG daily Brilinta 90 MG 1 tablet 2 times daily  2. Which pharmacy/location (including street and city if local pharmacy) is medication to be sent to? [facility] or [place] for [place]  3. Do they need a 30 day or 90 day supply? 90 day

## 2022-04-12 NOTE — Progress Notes (Signed)
Daily Session Note  Patient Details  Name: [name] MRN: [id number] Date of Birth: Jul 04, 1935 Referring Provider:   Flowsheet Row Cardiac Rehab from 02/15/2022 in [facility] Cardiac and Pulmonary Rehab  Referring Provider End       Encounter Date: 04/12/2022  Check In:  Session Check In - 04/12/22 1533       Check-In   Supervising physician immediately available to respond to emergencies See telemetry face sheet for immediately available ER MD    Location [facility]-Cardiac & Pulmonary Rehab    Staff Present Renita Papa, RN BSN;Joseph Tessie Fass, RCP,RRT,BSRT;Kara [place], MS, ASCM CEP, Exercise Physiologist;Noah Tickle, BS, Exercise Physiologist    Virtual Visit No    Medication changes reported     No    Fall or balance concerns reported    No    Warm-up and Cool-down Performed on first and last piece of equipment    Resistance Training Performed Yes    VAD Patient? No    PAD/SET Patient? No      Pain Assessment   Currently in Pain? No/denies                Social History   Tobacco Use  Smoking Status Never  Smokeless Tobacco Never    Goals Met:  Independence with exercise equipment Exercise tolerated well No report of concerns or symptoms today Strength training completed today  Goals Unmet:  Not Applicable  Comments: Pt able to follow exercise prescription today without complaint.  Will continue to monitor for progression.    Dr. Emily Filbert is Medical Director for [place].  Dr. Ottie Glazier is Medical Director for [facility] Pulmonary Rehabilitation.

## 2022-04-13 LAB — CYTOLOGY - NON PAP

## 2022-04-14 ENCOUNTER — Telehealth: Payer: Self-pay | Admitting: *Deleted

## 2022-04-14 ENCOUNTER — Encounter: Payer: Medicare Other | Admitting: *Deleted

## 2022-04-14 DIAGNOSIS — Z955 Presence of coronary angioplasty implant and graft: Secondary | ICD-10-CM

## 2022-04-14 DIAGNOSIS — I213 ST elevation (STEMI) myocardial infarction of unspecified site: Secondary | ICD-10-CM | POA: Diagnosis not present

## 2022-04-14 DIAGNOSIS — Z48812 Encounter for surgical aftercare following surgery on the circulatory system: Secondary | ICD-10-CM | POA: Diagnosis not present

## 2022-04-14 NOTE — Telephone Encounter (Signed)
-----   Message from Abbie Sons, MD sent at 04/13/2022  4:36 PM EDT ----- Urine cytology is still with atypical cells. Needs a repeat UA to make sure bacteriuria has resolved

## 2022-04-14 NOTE — Progress Notes (Signed)
Daily Session Note  Patient Details  Name: [name] MRN: [id number] Date of Birth: 07-Nov-1934 Referring Provider:   Flowsheet Row Cardiac Rehab from 02/15/2022 in [facility] Cardiac and Pulmonary Rehab  Referring Provider End       Encounter Date: 04/14/2022  Check In:  Session Check In - 04/14/22 1608       Check-In   Supervising physician immediately available to respond to emergencies See telemetry face sheet for immediately available ER MD    Location [facility]-Cardiac & Pulmonary Rehab    Staff Present Justin Mend, RCP,RRT,BSRT;Akili Corsetti Sherryll Burger, RN BSN;Noah Tickle, BS, Exercise Physiologist    Virtual Visit No    Medication changes reported     No    Fall or balance concerns reported    No    Warm-up and Cool-down Performed on first and last piece of equipment    Resistance Training Performed Yes    VAD Patient? No    PAD/SET Patient? No      Pain Assessment   Currently in Pain? No/denies                Social History   Tobacco Use  Smoking Status Never  Smokeless Tobacco Never    Goals Met:  Independence with exercise equipment Exercise tolerated well No report of concerns or symptoms today Strength training completed today  Goals Unmet:  Not Applicable  Comments: Pt able to follow exercise prescription today without complaint.  Will continue to monitor for progression.    Dr. Emily Filbert is Medical Director for [place].  Dr. Ottie Glazier is Medical Director for [facility] Pulmonary Rehabilitation.

## 2022-04-15 ENCOUNTER — Other Ambulatory Visit: Payer: Self-pay | Admitting: Family Medicine

## 2022-04-15 ENCOUNTER — Encounter: Payer: Medicare Other | Admitting: *Deleted

## 2022-04-15 DIAGNOSIS — I213 ST elevation (STEMI) myocardial infarction of unspecified site: Secondary | ICD-10-CM

## 2022-04-15 DIAGNOSIS — Z48812 Encounter for surgical aftercare following surgery on the circulatory system: Secondary | ICD-10-CM | POA: Diagnosis not present

## 2022-04-15 DIAGNOSIS — Z955 Presence of coronary angioplasty implant and graft: Secondary | ICD-10-CM | POA: Diagnosis not present

## 2022-04-15 DIAGNOSIS — R310 Gross hematuria: Secondary | ICD-10-CM

## 2022-04-15 NOTE — Progress Notes (Signed)
Daily Session Note  Patient Details  Name: [name] MRN: [id number] Date of Birth: Mar 15, 1935 Referring Provider:   Flowsheet Row Cardiac Rehab from 02/15/2022 in [facility] Cardiac and Pulmonary Rehab  Referring Provider End       Encounter Date: 04/15/2022  Check In:  Session Check In - 04/15/22 1614       Check-In   Supervising physician immediately available to respond to emergencies See telemetry face sheet for immediately available ER MD    Location [facility]-Cardiac & Pulmonary Rehab    Staff Present Renita Papa, RN BSN;Joseph [place], [place];Heath Lark, RN, BSN, CCRP    Virtual Visit No    Medication changes reported     No    Fall or balance concerns reported    No    Warm-up and Cool-down Performed on first and last piece of equipment    Resistance Training Performed Yes    VAD Patient? No    PAD/SET Patient? No      Pain Assessment   Currently in Pain? No/denies                Social History   Tobacco Use  Smoking Status Never  Smokeless Tobacco Never    Goals Met:  Independence with exercise equipment Exercise tolerated well No report of concerns or symptoms today Strength training completed today  Goals Unmet:  Not Applicable  Comments: Pt able to follow exercise prescription today without complaint.  Will continue to monitor for progression.    Dr. Emily Filbert is Medical Director for [place].  Dr. Ottie Glazier is Medical Director for [facility] Pulmonary Rehabilitation.

## 2022-04-15 NOTE — Telephone Encounter (Signed)
Notified patient as instructed, Discussed follow-up appointments, patient agrees  

## 2022-04-19 ENCOUNTER — Encounter: Payer: Medicare Other | Admitting: *Deleted

## 2022-04-19 DIAGNOSIS — I213 ST elevation (STEMI) myocardial infarction of unspecified site: Secondary | ICD-10-CM

## 2022-04-19 DIAGNOSIS — Z955 Presence of coronary angioplasty implant and graft: Secondary | ICD-10-CM | POA: Diagnosis not present

## 2022-04-19 DIAGNOSIS — Z48812 Encounter for surgical aftercare following surgery on the circulatory system: Secondary | ICD-10-CM | POA: Diagnosis not present

## 2022-04-19 NOTE — Progress Notes (Signed)
Daily Session Note  Patient Details  Name: EMORI KAMAU MRN: [id number] Date of Birth: 03/16/1935 Referring Provider:   Flowsheet Row Cardiac Rehab from 02/15/2022 in [facility] Cardiac and Pulmonary Rehab  Referring Provider End       Encounter Date: 04/19/2022  Check In:  Session Check In - 04/19/22 1545       Check-In   Supervising physician immediately available to respond to emergencies See telemetry face sheet for immediately available ER MD    Location [facility]-Cardiac & Pulmonary Rehab    Staff Present Renita Papa, RN BSN;Joseph [place], RCP,RRT,BSRT;Kelly [place], [place], ACSM CEP, Exercise Physiologist    Virtual Visit No    Medication changes reported     No    Fall or balance concerns reported    No    Warm-up and Cool-down Performed on first and last piece of equipment    Resistance Training Performed Yes    VAD Patient? No    PAD/SET Patient? No      Pain Assessment   Currently in Pain? No/denies                Social History   Tobacco Use  Smoking Status Never  Smokeless Tobacco Never    Goals Met:  Independence with exercise equipment Exercise tolerated well No report of concerns or symptoms today Strength training completed today  Goals Unmet:  Not Applicable  Comments: Pt able to follow exercise prescription today without complaint.  Will continue to monitor for progression.    Dr. Emily Filbert is Medical Director for [facility] [place].  Dr. Ottie Glazier is Medical Director for [facility] Pulmonary Rehabilitation.

## 2022-04-21 ENCOUNTER — Encounter: Payer: Medicare Other | Admitting: *Deleted

## 2022-04-21 ENCOUNTER — Other Ambulatory Visit: Payer: Medicare Other

## 2022-04-21 DIAGNOSIS — Z955 Presence of coronary angioplasty implant and graft: Secondary | ICD-10-CM

## 2022-04-21 DIAGNOSIS — I213 ST elevation (STEMI) myocardial infarction of unspecified site: Secondary | ICD-10-CM

## 2022-04-21 DIAGNOSIS — Z48812 Encounter for surgical aftercare following surgery on the circulatory system: Secondary | ICD-10-CM | POA: Diagnosis not present

## 2022-04-21 DIAGNOSIS — R310 Gross hematuria: Secondary | ICD-10-CM | POA: Diagnosis not present

## 2022-04-21 LAB — MICROSCOPIC EXAMINATION
RBC, Urine: >30 /hpf — AB (ref 0–2)
WBC, UA: >30 /hpf — AB (ref 0–5)

## 2022-04-21 LAB — URINALYSIS, COMPLETE
Bilirubin, UA: NEGATIVE
Glucose, UA: NEGATIVE
Ketones, UA: NEGATIVE
Nitrite, UA: NEGATIVE
Specific Gravity, UA: 1.02 (ref 1.005–1.030)
Urobilinogen, Ur: 1 mg/dL (ref 0.2–1.0)
pH, UA: 6 (ref 5.0–7.5)

## 2022-04-21 NOTE — Progress Notes (Signed)
Daily Session Note  Patient Details  Name: ALTER MOSS MRN: [id number] Date of Birth: 1935/06/17 Referring Provider:   Flowsheet Row Cardiac Rehab from 02/15/2022 in [facility] Cardiac and Pulmonary Rehab  Referring Provider End       Encounter Date: 04/21/2022  Check In:  Session Check In - 04/21/22 1615       Check-In   Supervising physician immediately available to respond to emergencies See telemetry face sheet for immediately available ER MD    Location [facility]-Cardiac & Pulmonary Rehab    Staff Present Renita Papa, RN BSN;Joseph [place], RCP,RRT,BSRT;Laureen [place], [place], RRT, CPFT    Virtual Visit No    Medication changes reported     No    Fall or balance concerns reported    No    Warm-up and Cool-down Performed on first and last piece of equipment    Resistance Training Performed Yes    VAD Patient? No    PAD/SET Patient? No      Pain Assessment   Currently in Pain? No/denies                Social History   Tobacco Use  Smoking Status Never  Smokeless Tobacco Never    Goals Met:  Independence with exercise equipment Exercise tolerated well No report of concerns or symptoms today Strength training completed today  Goals Unmet:  Not Applicable  Comments: Pt able to follow exercise prescription today without complaint.  Will continue to monitor for progression.    Dr. Emily Filbert is Medical Director for [place].  Dr. Ottie Glazier is Medical Director for [facility] Pulmonary Rehabilitation.

## 2022-04-22 ENCOUNTER — Telehealth: Payer: Self-pay | Admitting: *Deleted

## 2022-04-22 ENCOUNTER — Encounter: Payer: Medicare Other | Admitting: *Deleted

## 2022-04-22 DIAGNOSIS — I213 ST elevation (STEMI) myocardial infarction of unspecified site: Secondary | ICD-10-CM | POA: Diagnosis not present

## 2022-04-22 DIAGNOSIS — Z955 Presence of coronary angioplasty implant and graft: Secondary | ICD-10-CM | POA: Diagnosis not present

## 2022-04-22 DIAGNOSIS — Z48812 Encounter for surgical aftercare following surgery on the circulatory system: Secondary | ICD-10-CM | POA: Diagnosis not present

## 2022-04-22 NOTE — Progress Notes (Signed)
Daily Session Note  Patient Details  Name: [name] MRN: [id number] Date of Birth: 1935/07/07 Referring Provider:   Flowsheet Row Cardiac Rehab from 02/15/2022 in [facility] Cardiac and Pulmonary Rehab  Referring Provider End       Encounter Date: 04/22/2022  Check In:  Session Check In - 04/22/22 1544       Check-In   Staff Present Renita Papa, RN BSN;Jessica [place], MA, RCEP, CCRP, CCET;Joseph [place], [place]    Virtual Visit No    Medication changes reported     No    Fall or balance concerns reported    No    Tobacco Cessation No Change    Warm-up and Cool-down Performed on first and last piece of equipment    Resistance Training Performed Yes    VAD Patient? No    PAD/SET Patient? No      Pain Assessment   Currently in Pain? No/denies                Social History   Tobacco Use  Smoking Status Never  Smokeless Tobacco Never    Goals Met:  Independence with exercise equipment Exercise tolerated well No report of concerns or symptoms today Strength training completed today  Goals Unmet:  Not Applicable  Comments: Pt able to follow exercise prescription today without complaint.  Will continue to monitor for progression.    Dr. Emily Filbert is Medical Director for [place].  Dr. Ottie Glazier is Medical Director for [facility] Pulmonary Rehabilitation.

## 2022-04-22 NOTE — Telephone Encounter (Signed)
-----   Message from Abbie Sons, MD sent at 04/22/2022  7:41 AM EDT ----- Urinalysis still showing WBC/RBC.  Needs a repeat urine culture

## 2022-04-23 NOTE — Telephone Encounter (Signed)
Notified patient as instructed, patient pleased. Discussed follow-up appointments, patient agrees  

## 2022-04-26 ENCOUNTER — Encounter: Payer: Medicare Other | Admitting: *Deleted

## 2022-04-26 DIAGNOSIS — Z820 Family history of epilepsy and other diseases of the nervous system: Secondary | ICD-10-CM | POA: Diagnosis not present

## 2022-04-26 DIAGNOSIS — I252 Old myocardial infarction: Secondary | ICD-10-CM | POA: Diagnosis not present

## 2022-04-26 DIAGNOSIS — I959 Hypotension, unspecified: Secondary | ICD-10-CM | POA: Diagnosis present

## 2022-04-26 DIAGNOSIS — Z955 Presence of coronary angioplasty implant and graft: Secondary | ICD-10-CM | POA: Diagnosis not present

## 2022-04-26 DIAGNOSIS — Z96653 Presence of artificial knee joint, bilateral: Secondary | ICD-10-CM | POA: Diagnosis present

## 2022-04-26 DIAGNOSIS — I110 Hypertensive heart disease with heart failure: Secondary | ICD-10-CM | POA: Diagnosis present

## 2022-04-26 DIAGNOSIS — I4510 Unspecified right bundle-branch block: Secondary | ICD-10-CM | POA: Diagnosis present

## 2022-04-26 DIAGNOSIS — I25118 Atherosclerotic heart disease of native coronary artery with other forms of angina pectoris: Secondary | ICD-10-CM | POA: Diagnosis not present

## 2022-04-26 DIAGNOSIS — N3289 Other specified disorders of bladder: Secondary | ICD-10-CM | POA: Diagnosis present

## 2022-04-26 DIAGNOSIS — I5022 Chronic systolic (congestive) heart failure: Secondary | ICD-10-CM | POA: Diagnosis present

## 2022-04-26 DIAGNOSIS — R55 Syncope and collapse: Secondary | ICD-10-CM | POA: Diagnosis not present

## 2022-04-26 DIAGNOSIS — I213 ST elevation (STEMI) myocardial infarction of unspecified site: Secondary | ICD-10-CM

## 2022-04-26 DIAGNOSIS — Z79899 Other long term (current) drug therapy: Secondary | ICD-10-CM | POA: Diagnosis not present

## 2022-04-26 DIAGNOSIS — R338 Other retention of urine: Secondary | ICD-10-CM | POA: Diagnosis not present

## 2022-04-26 DIAGNOSIS — N138 Other obstructive and reflux uropathy: Secondary | ICD-10-CM | POA: Diagnosis present

## 2022-04-26 DIAGNOSIS — N320 Bladder-neck obstruction: Secondary | ICD-10-CM | POA: Diagnosis not present

## 2022-04-26 DIAGNOSIS — I440 Atrioventricular block, first degree: Secondary | ICD-10-CM | POA: Diagnosis present

## 2022-04-26 DIAGNOSIS — R1030 Lower abdominal pain, unspecified: Secondary | ICD-10-CM | POA: Diagnosis not present

## 2022-04-26 DIAGNOSIS — M1990 Unspecified osteoarthritis, unspecified site: Secondary | ICD-10-CM | POA: Diagnosis present

## 2022-04-26 DIAGNOSIS — I5020 Unspecified systolic (congestive) heart failure: Secondary | ICD-10-CM | POA: Diagnosis not present

## 2022-04-26 DIAGNOSIS — I10 Essential (primary) hypertension: Secondary | ICD-10-CM | POA: Diagnosis not present

## 2022-04-26 DIAGNOSIS — D500 Iron deficiency anemia secondary to blood loss (chronic): Secondary | ICD-10-CM | POA: Diagnosis present

## 2022-04-26 DIAGNOSIS — N401 Enlarged prostate with lower urinary tract symptoms: Secondary | ICD-10-CM | POA: Diagnosis present

## 2022-04-26 DIAGNOSIS — H9190 Unspecified hearing loss, unspecified ear: Secondary | ICD-10-CM | POA: Diagnosis present

## 2022-04-26 DIAGNOSIS — R310 Gross hematuria: Secondary | ICD-10-CM | POA: Diagnosis not present

## 2022-04-26 DIAGNOSIS — N200 Calculus of kidney: Secondary | ICD-10-CM | POA: Diagnosis not present

## 2022-04-26 DIAGNOSIS — R339 Retention of urine, unspecified: Secondary | ICD-10-CM | POA: Diagnosis not present

## 2022-04-26 DIAGNOSIS — R42 Dizziness and giddiness: Secondary | ICD-10-CM | POA: Diagnosis not present

## 2022-04-26 DIAGNOSIS — R102 Pelvic and perineal pain: Secondary | ICD-10-CM | POA: Diagnosis not present

## 2022-04-26 DIAGNOSIS — N1330 Unspecified hydronephrosis: Secondary | ICD-10-CM | POA: Diagnosis not present

## 2022-04-26 DIAGNOSIS — D510 Vitamin B12 deficiency anemia due to intrinsic factor deficiency: Secondary | ICD-10-CM | POA: Diagnosis present

## 2022-04-26 DIAGNOSIS — I255 Ischemic cardiomyopathy: Secondary | ICD-10-CM | POA: Diagnosis not present

## 2022-04-26 NOTE — Progress Notes (Signed)
Daily Session Note  Patient Details  Name: [name] MRN: [id number] Date of Birth: 12-Mar-1935 Referring Provider:   Flowsheet Row Cardiac Rehab from 02/15/2022 in [facility]  Referring Provider End       Encounter Date: 04/26/2022  Check In:  Session Check In - 04/26/22 1549       Check-In   Supervising physician immediately available to respond to emergencies See telemetry face sheet for immediately available ER MD    Location [facility]-Cardiac & Pulmonary Rehab    Staff Present Renita Papa, RN BSN;Joseph Reeves, RCP,RRT,BSRT;Kara Imperial, [place], ASCM CEP, Exercise Physiologist    Virtual Visit No    Medication changes reported     No    Fall or balance concerns reported    No    Warm-up and Cool-down Performed on first and last piece of equipment    Resistance Training Performed Yes    VAD Patient? No    PAD/SET Patient? No      Pain Assessment   Currently in Pain? No/denies                Social History   Tobacco Use  Smoking Status Never  Smokeless Tobacco Never    Goals Met:  Independence with exercise equipment Exercise tolerated well No report of concerns or symptoms today Strength training completed today  Goals Unmet:  Not Applicable  Comments: Pt able to follow exercise prescription today without complaint.  Will continue to monitor for progression.    Dr. Emily Filbert is Medical Director for [place].  Dr. Ottie Glazier is Medical Director for [facility].

## 2022-04-27 ENCOUNTER — Other Ambulatory Visit: Payer: Self-pay | Admitting: *Deleted

## 2022-04-27 ENCOUNTER — Other Ambulatory Visit: Payer: Medicare Other

## 2022-04-27 DIAGNOSIS — R310 Gross hematuria: Secondary | ICD-10-CM | POA: Diagnosis not present

## 2022-04-27 LAB — MICROSCOPIC EXAMINATION: RBC, Urine: >30 /hpf — AB (ref 0–2)

## 2022-04-27 LAB — URINALYSIS, COMPLETE
Bilirubin, UA: NEGATIVE
Glucose, UA: NEGATIVE
Ketones, UA: NEGATIVE
Nitrite, UA: NEGATIVE
Specific Gravity, UA: 1.02 (ref 1.005–1.030)
Urobilinogen, Ur: 1 mg/dL (ref 0.2–1.0)
pH, UA: 7 (ref 5.0–7.5)

## 2022-04-28 ENCOUNTER — Encounter: Payer: Medicare Other | Admitting: *Deleted

## 2022-04-28 ENCOUNTER — Other Ambulatory Visit: Payer: Self-pay

## 2022-04-28 ENCOUNTER — Encounter: Payer: Self-pay | Admitting: Emergency Medicine

## 2022-04-28 ENCOUNTER — Inpatient Hospital Stay
Admission: EM | Admit: 2022-04-28 | Discharge: 2022-05-02 | DRG: 726 | Disposition: A | Discharge status: Home Health | Payer: Medicare Other | Attending: Internal Medicine | Admitting: Internal Medicine

## 2022-04-28 ENCOUNTER — Telehealth: Payer: Self-pay | Admitting: Urology

## 2022-04-28 VITALS — Ht 71.0 in | Wt 174.2 lb

## 2022-04-28 DIAGNOSIS — N138 Other obstructive and reflux uropathy: Secondary | ICD-10-CM | POA: Diagnosis present

## 2022-04-28 DIAGNOSIS — N320 Bladder-neck obstruction: Secondary | ICD-10-CM | POA: Diagnosis not present

## 2022-04-28 DIAGNOSIS — Z96653 Presence of artificial knee joint, bilateral: Secondary | ICD-10-CM | POA: Diagnosis present

## 2022-04-28 DIAGNOSIS — R338 Other retention of urine: Principal | ICD-10-CM

## 2022-04-28 DIAGNOSIS — R310 Gross hematuria: Secondary | ICD-10-CM | POA: Diagnosis present

## 2022-04-28 DIAGNOSIS — I5022 Chronic systolic (congestive) heart failure: Secondary | ICD-10-CM | POA: Diagnosis present

## 2022-04-28 DIAGNOSIS — Z955 Presence of coronary angioplasty implant and graft: Secondary | ICD-10-CM

## 2022-04-28 DIAGNOSIS — I255 Ischemic cardiomyopathy: Secondary | ICD-10-CM | POA: Diagnosis present

## 2022-04-28 DIAGNOSIS — R102 Pelvic and perineal pain: Secondary | ICD-10-CM

## 2022-04-28 DIAGNOSIS — R339 Retention of urine, unspecified: Secondary | ICD-10-CM | POA: Diagnosis not present

## 2022-04-28 DIAGNOSIS — Z820 Family history of epilepsy and other diseases of the nervous system: Secondary | ICD-10-CM

## 2022-04-28 DIAGNOSIS — I5020 Unspecified systolic (congestive) heart failure: Secondary | ICD-10-CM

## 2022-04-28 DIAGNOSIS — M1990 Unspecified osteoarthritis, unspecified site: Secondary | ICD-10-CM | POA: Diagnosis present

## 2022-04-28 DIAGNOSIS — Z79899 Other long term (current) drug therapy: Secondary | ICD-10-CM

## 2022-04-28 DIAGNOSIS — I440 Atrioventricular block, first degree: Secondary | ICD-10-CM | POA: Diagnosis present

## 2022-04-28 DIAGNOSIS — R55 Syncope and collapse: Secondary | ICD-10-CM | POA: Diagnosis present

## 2022-04-28 DIAGNOSIS — Z7902 Long term (current) use of antithrombotics/antiplatelets: Secondary | ICD-10-CM

## 2022-04-28 DIAGNOSIS — I252 Old myocardial infarction: Secondary | ICD-10-CM

## 2022-04-28 DIAGNOSIS — I10 Essential (primary) hypertension: Secondary | ICD-10-CM | POA: Diagnosis not present

## 2022-04-28 DIAGNOSIS — I4510 Unspecified right bundle-branch block: Secondary | ICD-10-CM | POA: Diagnosis present

## 2022-04-28 DIAGNOSIS — R231 Pallor: Secondary | ICD-10-CM | POA: Diagnosis present

## 2022-04-28 DIAGNOSIS — D510 Vitamin B12 deficiency anemia due to intrinsic factor deficiency: Secondary | ICD-10-CM | POA: Diagnosis present

## 2022-04-28 DIAGNOSIS — I2510 Atherosclerotic heart disease of native coronary artery without angina pectoris: Secondary | ICD-10-CM

## 2022-04-28 DIAGNOSIS — I25118 Atherosclerotic heart disease of native coronary artery with other forms of angina pectoris: Secondary | ICD-10-CM | POA: Diagnosis present

## 2022-04-28 DIAGNOSIS — Z7982 Long term (current) use of aspirin: Secondary | ICD-10-CM

## 2022-04-28 DIAGNOSIS — N1330 Unspecified hydronephrosis: Secondary | ICD-10-CM | POA: Diagnosis present

## 2022-04-28 DIAGNOSIS — N401 Enlarged prostate with lower urinary tract symptoms: Principal | ICD-10-CM | POA: Diagnosis present

## 2022-04-28 DIAGNOSIS — R1030 Lower abdominal pain, unspecified: Secondary | ICD-10-CM | POA: Diagnosis not present

## 2022-04-28 DIAGNOSIS — H9190 Unspecified hearing loss, unspecified ear: Secondary | ICD-10-CM | POA: Diagnosis present

## 2022-04-28 DIAGNOSIS — N3289 Other specified disorders of bladder: Secondary | ICD-10-CM | POA: Diagnosis present

## 2022-04-28 DIAGNOSIS — D500 Iron deficiency anemia secondary to blood loss (chronic): Secondary | ICD-10-CM | POA: Diagnosis present

## 2022-04-28 DIAGNOSIS — I959 Hypotension, unspecified: Secondary | ICD-10-CM | POA: Diagnosis present

## 2022-04-28 DIAGNOSIS — I213 ST elevation (STEMI) myocardial infarction of unspecified site: Secondary | ICD-10-CM

## 2022-04-28 DIAGNOSIS — I110 Hypertensive heart disease with heart failure: Secondary | ICD-10-CM | POA: Diagnosis present

## 2022-04-28 DIAGNOSIS — R42 Dizziness and giddiness: Secondary | ICD-10-CM | POA: Diagnosis present

## 2022-04-28 LAB — COMPREHENSIVE METABOLIC PANEL
ALT: 18 U/L (ref 0–44)
AST: 30 U/L (ref 15–41)
Albumin: 4.1 g/dL (ref 3.5–5.0)
Alkaline Phosphatase: 92 U/L (ref 38–126)
Anion gap: 8 (ref 5–15)
BUN: 16 mg/dL (ref 8–23)
CO2: 27 mmol/L (ref 22–32)
Calcium: 9.2 mg/dL (ref 8.9–10.3)
Chloride: 103 mmol/L (ref 98–111)
Creatinine, Ser: 0.8 mg/dL (ref 0.61–1.24)
GFR, Estimated: >60 mL/min (ref >60)
Glucose, Bld: 146 mg/dL — ABNORMAL HIGH (ref 70–99)
Potassium: 3.8 mmol/L (ref 3.5–5.1)
Sodium: 138 mmol/L (ref 135–145)
Total Bilirubin: 1 mg/dL (ref 0.3–1.2)
Total Protein: 7.3 g/dL (ref 6.5–8.1)

## 2022-04-28 LAB — CBC
HCT: 39.4 % (ref 39.0–52.0)
Hemoglobin: 12.7 g/dL — ABNORMAL LOW (ref 13.0–17.0)
MCH: 30.3 pg (ref 26.0–34.0)
MCHC: 32.2 g/dL (ref 30.0–36.0)
MCV: 94 fL (ref 80.0–100.0)
Platelets: 199 10*3/uL (ref 150–400)
RBC: 4.19 MIL/uL — ABNORMAL LOW (ref 4.22–5.81)
RDW: 13.7 % (ref 11.5–15.5)
WBC: 10.3 10*3/uL (ref 4.0–10.5)
nRBC: 0 % (ref 0.0–0.2)

## 2022-04-28 LAB — PROTIME-INR
INR: 1.1 (ref 0.8–1.2)
Prothrombin Time: 14.2 seconds (ref 11.4–15.2)

## 2022-04-28 LAB — APTT: aPTT: 30 seconds (ref 24–36)

## 2022-04-28 MED ORDER — SODIUM CHLORIDE 0.9 % IV BOLUS
1000.0000 mL | Freq: Once | INTRAVENOUS | Status: AC
  Administered 2022-04-28: 1000 mL via INTRAVENOUS

## 2022-04-28 NOTE — ED Provider Notes (Signed)
[facility] Provider Note    Event Date/Time   First MD Initiated Contact with Patient 04/28/22 2301     (approximate)   History   Hematuria   HPI  [name] is a 87 y.o. male with a relatively recent cardiac history who is on aspirin and Brilinta who presents with his wife for evaluation of gross hematuria with associated pain.  The history is a little bit vague, but reportedly he and his wife have noticed that his urine has been dark for the last several days, presumably from blood.  He has not been having any pain, however, until this afternoon.  He engaged in his scheduled cardiac rehabilitation therapy which was relatively strenuous exercise for about an hour.  After that he felt an urge to urinate and he passed what he describes as "pure blood" with clots.  Never happened before.  It was quite painful with sharp and burning pain when he did so.  He said that he continues to have pain in his penis even when he is not urinating, but he also feels a constant urge to urinate but feels like he is unable to fully empty his bladder.  When the bleeding started, he was still with the rehabilitation team, so they sent him over to Dr. Dene Gentry office.  They were unable to make an appointment for him this afternoon, but made an appointment for the patient with Dr. Bernardo Heater in the morning.  However the symptoms became severe enough tonight that he felt like he could not wait so he came to the emergency department.  Of note, when they came to the triage room, the patient became extremely pale and was not responding to questions; it seemed like he was close to passing out.  His initial blood pressure was 71/50, and he was brought back immediately to an exam room by the triage nurse, Lattie Haw, and I was alerted to his presence.  A liter of LR IV bolus was ordered.  The patient denies fever, chest pain, shortness of breath, nausea, vomiting, and abdominal pain.      Physical  Exam   Triage Vital Signs: ED Triage Vitals  Enc Vitals Group     BP 04/28/22 2248 (!) 71/50     Pulse Rate 04/28/22 2248 60     Resp 04/28/22 2248 16     Temp 04/28/22 2248 98.1 F (36.7 C)     Temp Source 04/28/22 2248 Oral     SpO2 04/28/22 2248 96 %     Weight 04/28/22 2246 78.9 kg (174 lb)     Height 04/28/22 2246 1.829 m (6')     Head Circumference --      Peak Flow --      Pain Score 04/28/22 2246 10     Pain Loc --      Pain Edu? --      Excl. in [place]? --     Most recent vital signs: Vitals:   04/29/22 0230 04/29/22 0300  BP: (!) 144/80 (!) 161/100  Pulse: 90 94  Resp: (!) 23 (!) 24  Temp:    SpO2: 98% 98%     General: Awake, no distress.  CV:  Good peripheral perfusion.  Regular rate and rhythm Resp:  Normal effort.  Breathing easily and comfortably, no intercostal retractions, no accessory muscle usage. Abd:  No distention.  Mild suprapubic tenderness to palpation. Other:  Passing gross blood when he urinates with sensation of polyuria and  sensation of decreased emptying.   ED Results / Procedures / Treatments   Labs (all labs ordered are listed, but only abnormal results are displayed) Labs Reviewed  CBC - Abnormal; Notable for the following components:      Result Value   RBC 4.19 (*)    Hemoglobin 12.7 (*)    All other components within normal limits  COMPREHENSIVE METABOLIC PANEL - Abnormal; Notable for the following components:   Glucose, Bld 146 (*)    All other components within normal limits  URINALYSIS, COMPLETE (UACMP) WITH MICROSCOPIC - Abnormal; Notable for the following components:   Color, Urine RED (*)    APPearance TURBID (*)    Glucose, UA   (*)    Value: TEST NOT REPORTED DUE TO COLOR INTERFERENCE OF URINE PIGMENT   Hgb urine dipstick   (*)    Value: TEST NOT REPORTED DUE TO COLOR INTERFERENCE OF URINE PIGMENT   Bilirubin Urine   (*)    Value: TEST NOT REPORTED DUE TO COLOR INTERFERENCE OF URINE PIGMENT   Ketones, ur   (*)     Value: TEST NOT REPORTED DUE TO COLOR INTERFERENCE OF URINE PIGMENT   Protein, ur   (*)    Value: TEST NOT REPORTED DUE TO COLOR INTERFERENCE OF URINE PIGMENT   Nitrite   (*)    Value: TEST NOT REPORTED DUE TO COLOR INTERFERENCE OF URINE PIGMENT   Leukocytes,Ua   (*)    Value: TEST NOT REPORTED DUE TO COLOR INTERFERENCE OF URINE PIGMENT   RBC / HPF >50 (*)    WBC, UA >50 (*)    All other components within normal limits  CBC WITH DIFFERENTIAL/PLATELET - Abnormal; Notable for the following components:   RBC 4.09 (*)    Hemoglobin 12.8 (*)    HCT 38.5 (*)    Neutro Abs 8.2 (*)    All other components within normal limits  URINE CULTURE  APTT  PROTIME-INR  CBC WITH DIFFERENTIAL/PLATELET  TYPE AND SCREEN     EKG  ED ECG REPORT I, Hinda Kehr, the attending physician, personally viewed and interpreted this ECG.  Date: 04/28/2022 EKG Time: 23: 49 Rate: 86 Rhythm: Sinus rhythm with first-degree AV block and occasional PVC QRS Axis: normal Intervals: Right bundle branch block ST/T Wave abnormalities: Non-specific ST segment / T-wave changes, but no clear evidence of acute ischemia. Narrative Interpretation: no definitive evidence of acute ischemia; does not meet STEMI criteria.  No significant change from prior; right bundle branch block was pre-existing.    RADIOLOGY See hospital course for details: Patient has large blood clot in bladder with bladder outlet obstruction and bilateral hydronephrosis.    PROCEDURES:  Critical Care performed: No  .1-3 Lead EKG Interpretation  Performed by: Hinda Kehr, MD Authorized by: Hinda Kehr, MD     Interpretation: normal     ECG rate:  62   ECG rate assessment: normal     Rhythm: sinus rhythm     Ectopy: none     Conduction: normal      MEDICATIONS ORDERED IN ED: Medications  sodium chloride irrigation 0.9 % 3,000 mL (3,000 mLs Irrigation New Bag/Given 04/29/22 0345)  sodium chloride 0.9 % bolus 1,000 mL (0 mLs  Intravenous Stopped 04/29/22 0123)  iohexol (OMNIPAQUE) 300 MG/ML solution 100 mL (100 mLs Intravenous Contrast Given 04/29/22 0043)  morphine (PF) 4 MG/ML injection 4 mg (4 mg Intravenous Given 04/29/22 0402)     IMPRESSION / MDM / ASSESSMENT AND  PLAN / ED COURSE  I reviewed the triage vital signs and the nursing notes.                              Differential diagnosis includes, but is not limited to, nonspecific gross hematuria in the setting of antiplatelet/blood thinning agents, neoplasm, UTI, electrolyte or metabolic abnormality, acute blood loss anemia, orthostatic hypotension, vasovagal episode, cardiac arrhythmia.  Patient's presentation is most consistent with acute presentation with potential threat to life or bodily function.  The patient's initial presentation to triage was very concerning with hypotension, altered mental status, and near syncope.  I saw the patient immediately and his wife confirmed that the patient's color had improved and he was back to being alert, oriented, and at his baseline.  As documented below, I rechecked his blood pressure and it is now consistently in the 140s to 150s while he is lying supine.  IV fluids are running.  Labs/studies ordered: Type and screen, pro time-INR, APTT, CMP, CBC, differential, urinalysis (complete), urine culture.  The patient is on the cardiac monitor and pulse oximetry.  I also ordered an EKG given his recent cardiac history and his near syncopal episode during triage.  I also ordered a bladder scan to see if he is retaining, possibly secondary to acute clots.  The patient is on the cardiac monitor to evaluate for evidence of arrhythmia and/or significant heart rate changes.   Clinical Course as of 04/29/22 0423  Wed Apr 28, 2022  2315 BP(!): 145/82 I personally rechecked the patient's blood pressure twice, and these measurements are consistent.  I believe that his initial triage blood pressure was the result of either  vasovagal or orthostatic episode.  I cautioned the patient and his wife that he should not get up out of bed without assistance for fear that he will become hypotensive again and may syncopized.  They stated that they understand. [CF]  2329 Comprehensive metabolic panel(!) Normal CMP [CF]  2329 CBC(!) Essentially normal CBC with a hemoglobin of 12.7 which is reassuring.  This is a drop of more than a point since the last hemoglobin we have on record from 3 months ago, but it is still essentially normal and may be within an expected variance over time.  Unlikely to represent acute blood loss particularly because the gross hematuria seems to have only started within the last 7 hours or so [CF]  Thu Apr 29, 2022  0029 Urinalysis, Complete w Microscopic Urine, Clean Catch(!) Gross hematuria, unable to determine anything from the urinalysis, but fortunately there is no bacteria seen [CF]  0251 CT HEMATURIA WORKUP I viewed and interpreted the patient's CT hematuria scan.  It is clear that the bladder is distended and that the patient has some hydronephrosis.  The radiologist identified both of those issues and the presence of what appears to be clot and blood in the bladder as well as evidence of bladder outlet obstruction with at least some degree of chronic as well as acute.  I reassessed the patient and he said that he is starting to hurt quite a bit more.  He has been unable to pass any urine recently.  At this point he qualifies for placement of an indwelling Foley catheter, and I requested that they place a three-way catheter to start continuous bladder irrigation.  Unclear disposition at this time, patient may need observation in the hospital, but he already has an appointment scheduled with  outpatient urology.  It is possible he may be able to be discharged with a leg bag of his hematuria resolves after Foley placement and CBI.  Patient consents to Foley placement and he and his wife understand and  agree with the plan. [CF]  4503 Reassessed the patient.  The continuous bladder irrigation seems to be working in terms of clearing some of the blood and clots, the output is now clear red rather than being pure blood.  However the patient still says he is having a lot of pain.  I ordered morphine 4 g IV.  At this point, given the patient's continual bleeding, bladder outlet obstruction with hydronephrosis on scan, persistent pain, etc., I feel that he would be safest in the hospital setting until he can continue his hematuria evaluation and be seen by urology.  I am consulting the hospitalist for admission.  I have no clinical question for the urologist to call them in the middle the night right now but they can consult in the morning. [CF]  8882 Consulted with Dr. Damita Dunnings by phone.  She will admit the patient to the hospitalist service and consult urology in the morning. [CF]    Clinical Course User Index [CF] Hinda Kehr, MD     FINAL CLINICAL IMPRESSION(S) / ED DIAGNOSES   Final diagnoses:  Acute urinary retention  Bladder outlet obstruction  Bilateral hydronephrosis  Acute suprapubic pain  Gross hematuria     Rx / DC Orders   ED Discharge Orders     None        Note:  This document was prepared using Dragon voice recognition software and may include unintentional dictation errors.   Hinda Kehr, MD 04/29/22 [phone]

## 2022-04-28 NOTE — Patient Instructions (Signed)
Discharge Patient Instructions  Patient Details  Name: [name] MRN: [id number] Date of Birth: November 20, 1934 Referring Provider:  Venia Carbon, MD   Number of Visits: 5  Reason for Discharge:  Patient reached a stable level of exercise. Patient independent in their exercise. Patient has met program and personal goals.  Smoking History:  Social History   Tobacco Use  Smoking Status Never  Smokeless Tobacco Never    Diagnosis:  ST elevation myocardial infarction (STEMI), unspecified artery (HCC)  Status post coronary artery stent placement  Initial Exercise Prescription:  Initial Exercise Prescription - 02/15/22 1500       Date of Initial Exercise RX and Referring Provider   Date 02/15/22    Referring Provider End      Oxygen   Maintain Oxygen Saturation 88% or higher      NuStep   Level 11    SPM 80    Minutes 15    METs 3.16      Arm Ergometer   Level 1    RPM 30    Minutes 15    METs 3.16      Track   Laps 25    Minutes 15    METs 2.86      Prescription Details   Frequency (times per week) 3    Duration Progress to 30 minutes of continuous aerobic without signs/symptoms of physical distress      Intensity   THRR 40-80% of Max Heartrate 95-122    Ratings of Perceived Exertion 11-13    Perceived Dyspnea 0-4      Progression   Progression Continue to progress workloads to maintain intensity without signs/symptoms of physical distress.      Resistance Training   Training Prescription Yes    Weight 2    Reps 10-15             Discharge Exercise Prescription (Final Exercise Prescription Changes):  Exercise Prescription Changes - 04/21/22 1500       Response to Exercise   Blood Pressure (Admit) 98/58    Blood Pressure (Exit) 102/60    Heart Rate (Admit) 61 bpm    Heart Rate (Exercise) 107 bpm    Heart Rate (Exit) 94 bpm    Oxygen Saturation (Admit) 94 %    Oxygen Saturation (Exercise) 92 %    Oxygen Saturation (Exit) 87 %     Rating of Perceived Exertion (Exercise) 13    Perceived Dyspnea (Exercise) 0    Symptoms none    Duration Continue with 30 min of aerobic exercise without signs/symptoms of physical distress.    Intensity THRR unchanged      Progression   Progression Continue to progress workloads to maintain intensity without signs/symptoms of physical distress.    Average METs 2.56      Resistance Training   Training Prescription Yes    Weight 3 lb    Reps 10-15      Interval Training   Interval Training No      NuStep   Level 3    Minutes 15    METs 2.5      Arm Ergometer   Level 1    Minutes 15      Track   Laps 30    Minutes 15    METs 2.63      Home Exercise Plan   Plans to continue exercise at Home (comment)   walking/working farm, PT exercises   Frequency Add 2 additional  days to program exercise sessions.    Initial Home Exercises Provided 03/18/22      Oxygen   Maintain Oxygen Saturation 88% or higher             Functional Capacity:  6 Minute Walk     Row Name 02/15/22 1531 04/28/22 1719       6 Minute Walk   Phase Initial Discharge    Distance 965 feet 1360 feet    Distance % Change -- 40.9 %    Distance Feet Change -- 395 ft    Walk Time 6 minutes 6 minutes    # of Rest Breaks 0 0    MPH 1.83 2.57    METS 3.16 2.53  Pre: 1.89 METS    RPE 11 11    Perceived Dyspnea  0 0    VO2 Peak 11.05 8.87  Pre: 6.64    Symptoms No No    Resting HR 69 bpm 75 bpm    Resting BP 120/60 98/58    Resting Oxygen Saturation  98 % 95 %    Exercise Oxygen Saturation  during 6 min walk 97 % 94 %    Max Ex. HR 108 bpm 104 bpm    Max Ex. BP 142/70 136/66    2 Minute Post BP 112/70 --               Nutrition & Weight - Outcomes:  Pre Biometrics - 02/15/22 1538       Pre Biometrics   Height 5' 11"  (1.803 m)    Weight 178 lb 6.4 oz (80.9 kg)    BMI (Calculated) 24.89    Single Leg Stand 5.56 seconds             Post Biometrics - 04/28/22 1721         Post  Biometrics   Height 5' 11"  (1.803 m)    Weight 174 lb 3.2 oz (79 kg)    BMI (Calculated) 24.31    Single Leg Stand 18.3 seconds             Nutrition:  Nutrition Therapy & Goals - 04/28/22 1645       Nutrition Therapy   RD appointment deferred Yes              Goals reviewed with patient; copy given to patient.

## 2022-04-28 NOTE — Progress Notes (Signed)
Daily Session Note  Patient Details  Name: [name] MRN: [id number] Date of Birth: 12/13/1934 Referring Provider:   Flowsheet Row Cardiac Rehab from 02/15/2022 in [facility]  Referring Provider End       Encounter Date: 04/28/2022  Check In:  Session Check In - 04/28/22 1554       Check-In   Supervising physician immediately available to respond to emergencies See telemetry face sheet for immediately available ER MD    Location [facility]-Cardiac & Pulmonary Rehab    Staff Present Coralie Keens, MS, ASCM CEP, Exercise Physiologist;Joseph Tessie Fass, Ernestina Patches, RN, ADN;Meredith Sherryll Burger, RN BSN    Virtual Visit No    Medication changes reported     No    Fall or balance concerns reported    No    Warm-up and Cool-down Performed on first and last piece of equipment    Resistance Training Performed Yes    VAD Patient? No    PAD/SET Patient? No      Pain Assessment   Currently in Pain? No/denies                Social History   Tobacco Use  Smoking Status Never  Smokeless Tobacco Never    Goals Met:  Independence with exercise equipment Exercise tolerated well No report of concerns or symptoms today Strength training completed today  Goals Unmet:  Not Applicable  Comments: Pt able to follow exercise prescription today without complaint.  Will continue to monitor for progression.   [place] Name 02/15/22 1531         6 Minute Walk   Phase Initial     Distance 965 feet     Walk Time 6 minutes     # of Rest Breaks 0     MPH 1.83     METS 3.16     RPE 11     Perceived Dyspnea  0     VO2 Peak 11.05     Symptoms No     Resting HR 69 bpm     Resting BP 120/60     Resting Oxygen Saturation  98 %     Exercise Oxygen Saturation  during 6 min walk 97 %     Max Ex. HR 108 bpm     Max Ex. BP 142/70     2 Minute Post BP 112/70               Dr. Emily Filbert is Medical Director for [place].  Dr. Ottie Glazier is Medical Director for [facility] Pulmonary Rehabilitation.

## 2022-04-28 NOTE — ED Triage Notes (Signed)
Pt to ED from home c/o hematuria today.  States noticed blood and clots today, made appt with Dr. Bernardo Heater for tomorrow but pain got worse.

## 2022-04-28 NOTE — ED Provider Notes (Incomplete)
[facility] Provider Note    Event Date/Time   First MD Initiated Contact with Patient 04/28/22 2301     (approximate)   History   Hematuria   HPI  [name] is a 87 y.o. male with a relatively recent cardiac history who is on aspirin and Brilinta who presents with his wife for evaluation of gross hematuria with associated pain.  The history is a little bit vague, but reportedly he and his wife have noticed that his urine has been dark for the last several days, presumably from blood.  He has not been having any pain, however, until this afternoon.  He engaged in his scheduled cardiac rehabilitation therapy which was relatively strenuous exercise for about an hour.  After that he felt an urge to urinate and he passed what he describes as "pure blood" with clots.  Never happened before.  It was quite painful with sharp and burning pain when he did so.  He said that he continues to have pain in his penis even when he is not urinating, but he also feels a constant urge to urinate but feels like he is unable to fully empty his bladder.  When the bleeding started, he was still with the rehabilitation team, so they sent him over to Dr. Dene Gentry office.  They were unable to make an appointment for him this afternoon, but made an appointment for the patient with Dr. Bernardo Heater in the morning.  However the symptoms became severe enough tonight that he felt like he could not wait so he came to the emergency department.  Of note, when they came to the triage room, the patient became extremely pale and was not responding to questions; it seemed like he was close to passing out.  His initial blood pressure was 71/50, and he was brought back immediately to an exam room by the triage nurse, Lattie Haw, and I was alerted to his presence.  A liter of LR IV bolus was ordered.  The patient denies fever, chest pain, shortness of breath, nausea, vomiting, and abdominal pain.      Physical  Exam   Triage Vital Signs: ED Triage Vitals  Enc Vitals Group     BP 04/28/22 2248 (!) 71/50     Pulse Rate 04/28/22 2248 60     Resp 04/28/22 2248 16     Temp 04/28/22 2248 98.1 F (36.7 C)     Temp Source 04/28/22 2248 Oral     SpO2 04/28/22 2248 96 %     Weight 04/28/22 2246 78.9 kg (174 lb)     Height 04/28/22 2246 1.829 m (6')     Head Circumference --      Peak Flow --      Pain Score 04/28/22 2246 10     Pain Loc --      Pain Edu? --      Excl. in [place]? --     Most recent vital signs: Vitals:   04/28/22 2248 04/28/22 2305  BP: (!) 71/50 (!) 145/82  Pulse: 60 63  Resp: 16 14  Temp: 98.1 F (36.7 C)   SpO2: 96% 99%     General: Awake, no distress.  CV:  Good peripheral perfusion.  Regular rate and rhythm Resp:  Normal effort.  Breathing easily and comfortably, no intercostal retractions, no accessory muscle usage. Abd:  No distention.  Mild suprapubic tenderness to palpation. Other:  Passing gross blood when he urinates with sensation of polyuria  and sensation of decreased emptying.   ED Results / Procedures / Treatments   Labs (all labs ordered are listed, but only abnormal results are displayed) Labs Reviewed  CBC - Abnormal; Notable for the following components:      Result Value   RBC 4.19 (*)    Hemoglobin 12.7 (*)    All other components within normal limits  COMPREHENSIVE METABOLIC PANEL - Abnormal; Notable for the following components:   Glucose, Bld 146 (*)    All other components within normal limits  URINE CULTURE  APTT  PROTIME-INR  DIFFERENTIAL  URINALYSIS, COMPLETE (UACMP) WITH MICROSCOPIC  TYPE AND SCREEN     EKG  ***   RADIOLOGY *** {USE THE WORD "INTERPRETED"!! You MUST document your own interpretation of imaging, as well as the fact that you reviewed the radiologist's report!:1}   PROCEDURES:  Critical Care performed: {CriticalCareYesNo:19197::"Yes, see critical care procedure note(s)","No"}  .1-3 Lead EKG  Interpretation  Performed by: Hinda Kehr, MD Authorized by: Hinda Kehr, MD     Interpretation: normal     ECG rate:  62   ECG rate assessment: normal     Rhythm: sinus rhythm     Ectopy: none     Conduction: normal      MEDICATIONS ORDERED IN ED: Medications  sodium chloride 0.9 % bolus 1,000 mL (1,000 mLs Intravenous New Bag/Given 04/28/22 2258)     IMPRESSION / MDM / ASSESSMENT AND PLAN / ED COURSE  I reviewed the triage vital signs and the nursing notes.                              Differential diagnosis includes, but is not limited to, nonspecific gross hematuria in the setting of antiplatelet/blood thinning agents, neoplasm, UTI, electrolyte or metabolic abnormality, acute blood loss anemia, orthostatic hypotension, vasovagal episode, cardiac arrhythmia.  Patient's presentation is most consistent with acute presentation with potential threat to life or bodily function.  The patient's initial presentation to triage was very concerning with hypotension, altered mental status, and near syncope.  I saw the patient immediately and his wife confirmed that the patient's color had improved and he was back to being alert, oriented, and at his baseline.  As documented below, I rechecked his blood pressure and it is now consistently in the 140s to 150s while he is lying supine.  IV fluids are running.  Labs/studies ordered: Type and screen, pro time-INR, APTT, CMP, CBC, differential, urinalysis (complete), urine culture.  The patient is on the cardiac monitor and pulse oximetry.  I also ordered an EKG given his recent cardiac history and his near syncopal episode during triage.  I also ordered a bladder scan to see if he is retaining, possibly secondary to acute clots.  The patient is on the cardiac monitor to evaluate for evidence of arrhythmia and/or significant heart rate changes.   Clinical Course as of 04/28/22 2335  Wed Apr 28, 2022  2315 BP(!): 145/82 I personally  rechecked the patient's blood pressure twice, and these measurements are consistent.  I believe that his initial triage blood pressure was the result of either vasovagal or orthostatic episode.  I cautioned the patient and his wife that he should not get up out of bed without assistance for fear that he will become hypotensive again and may syncopized.  They stated that they understand. [CF]  2329 Comprehensive metabolic panel(!) Normal CMP [CF]  2329 CBC(!) Essentially normal CBC  with a hemoglobin of 12.7 which is reassuring.  This is a drop of more than a point since the last hemoglobin we have on record from 3 months ago, but it is still essentially normal and may be within an expected variance over time.  Unlikely to represent acute blood loss particularly because the gross hematuria seems to have only started within the last 7 hours or so [CF]    Clinical Course User Index [CF] Hinda Kehr, MD     FINAL CLINICAL IMPRESSION(S) / ED DIAGNOSES   Final diagnoses:  None     Rx / DC Orders   ED Discharge Orders     None        Note:  This document was prepared using Dragon voice recognition software and may include unintentional dictation errors.

## 2022-04-29 ENCOUNTER — Ambulatory Visit: Payer: Medicare Other | Admitting: Urology

## 2022-04-29 ENCOUNTER — Inpatient Hospital Stay: Payer: Medicare Other

## 2022-04-29 ENCOUNTER — Encounter: Payer: Self-pay | Admitting: Urology

## 2022-04-29 ENCOUNTER — Emergency Department: Payer: Medicare Other

## 2022-04-29 DIAGNOSIS — I5022 Chronic systolic (congestive) heart failure: Secondary | ICD-10-CM | POA: Diagnosis present

## 2022-04-29 DIAGNOSIS — R338 Other retention of urine: Secondary | ICD-10-CM | POA: Insufficient documentation

## 2022-04-29 DIAGNOSIS — Z96653 Presence of artificial knee joint, bilateral: Secondary | ICD-10-CM | POA: Diagnosis present

## 2022-04-29 DIAGNOSIS — N200 Calculus of kidney: Secondary | ICD-10-CM | POA: Diagnosis not present

## 2022-04-29 DIAGNOSIS — R310 Gross hematuria: Secondary | ICD-10-CM

## 2022-04-29 DIAGNOSIS — I255 Ischemic cardiomyopathy: Secondary | ICD-10-CM

## 2022-04-29 DIAGNOSIS — Z820 Family history of epilepsy and other diseases of the nervous system: Secondary | ICD-10-CM | POA: Diagnosis not present

## 2022-04-29 DIAGNOSIS — I25118 Atherosclerotic heart disease of native coronary artery with other forms of angina pectoris: Secondary | ICD-10-CM | POA: Diagnosis present

## 2022-04-29 DIAGNOSIS — Z79899 Other long term (current) drug therapy: Secondary | ICD-10-CM | POA: Diagnosis not present

## 2022-04-29 DIAGNOSIS — H9190 Unspecified hearing loss, unspecified ear: Secondary | ICD-10-CM | POA: Diagnosis present

## 2022-04-29 DIAGNOSIS — D510 Vitamin B12 deficiency anemia due to intrinsic factor deficiency: Secondary | ICD-10-CM | POA: Diagnosis present

## 2022-04-29 DIAGNOSIS — N1330 Unspecified hydronephrosis: Secondary | ICD-10-CM | POA: Insufficient documentation

## 2022-04-29 DIAGNOSIS — R55 Syncope and collapse: Secondary | ICD-10-CM | POA: Insufficient documentation

## 2022-04-29 DIAGNOSIS — I110 Hypertensive heart disease with heart failure: Secondary | ICD-10-CM | POA: Diagnosis present

## 2022-04-29 DIAGNOSIS — N320 Bladder-neck obstruction: Secondary | ICD-10-CM

## 2022-04-29 DIAGNOSIS — R102 Pelvic and perineal pain: Secondary | ICD-10-CM | POA: Insufficient documentation

## 2022-04-29 DIAGNOSIS — I5020 Unspecified systolic (congestive) heart failure: Secondary | ICD-10-CM

## 2022-04-29 DIAGNOSIS — R42 Dizziness and giddiness: Secondary | ICD-10-CM

## 2022-04-29 DIAGNOSIS — I440 Atrioventricular block, first degree: Secondary | ICD-10-CM | POA: Diagnosis present

## 2022-04-29 DIAGNOSIS — I252 Old myocardial infarction: Secondary | ICD-10-CM

## 2022-04-29 DIAGNOSIS — N401 Enlarged prostate with lower urinary tract symptoms: Secondary | ICD-10-CM | POA: Diagnosis present

## 2022-04-29 DIAGNOSIS — D500 Iron deficiency anemia secondary to blood loss (chronic): Secondary | ICD-10-CM | POA: Diagnosis present

## 2022-04-29 DIAGNOSIS — M1990 Unspecified osteoarthritis, unspecified site: Secondary | ICD-10-CM | POA: Diagnosis present

## 2022-04-29 DIAGNOSIS — N138 Other obstructive and reflux uropathy: Secondary | ICD-10-CM | POA: Diagnosis present

## 2022-04-29 DIAGNOSIS — I959 Hypotension, unspecified: Secondary | ICD-10-CM | POA: Diagnosis present

## 2022-04-29 DIAGNOSIS — I10 Essential (primary) hypertension: Secondary | ICD-10-CM

## 2022-04-29 DIAGNOSIS — I4510 Unspecified right bundle-branch block: Secondary | ICD-10-CM | POA: Diagnosis present

## 2022-04-29 DIAGNOSIS — Z955 Presence of coronary angioplasty implant and graft: Secondary | ICD-10-CM | POA: Diagnosis not present

## 2022-04-29 DIAGNOSIS — N3289 Other specified disorders of bladder: Secondary | ICD-10-CM | POA: Diagnosis present

## 2022-04-29 LAB — URINALYSIS, COMPLETE (UACMP) WITH MICROSCOPIC
Bacteria, UA: NONE SEEN
RBC / HPF: >50 RBC/hpf — ABNORMAL HIGH (ref 0–5)
Specific Gravity, Urine: 1.02 (ref 1.005–1.030)
Squamous Epithelial / HPF: NONE SEEN (ref 0–5)
WBC, UA: >50 WBC/hpf — ABNORMAL HIGH (ref 0–5)

## 2022-04-29 LAB — CBC WITH DIFFERENTIAL/PLATELET
Basophils Absolute: 0.1 10*3/uL (ref 0.0–0.1)
Basophils Relative: 1 %
Eosinophils Absolute: 0.1 10*3/uL (ref 0.0–0.5)
Eosinophils Relative: 1 %
HCT: 38.5 % — ABNORMAL LOW (ref 39.0–52.0)
Hemoglobin: 12.8 g/dL — ABNORMAL LOW (ref 13.0–17.0)
Lymphocytes Relative: 12 %
Lymphs Abs: 1.2 10*3/uL (ref 0.7–4.0)
MCH: 31.3 pg (ref 26.0–34.0)
MCHC: 33.2 g/dL (ref 30.0–36.0)
MCV: 94.1 fL (ref 80.0–100.0)
Monocytes Absolute: 1 10*3/uL (ref 0.1–1.0)
Monocytes Relative: 6 %
Neutro Abs: 8.2 10*3/uL — ABNORMAL HIGH (ref 1.7–7.7)
Neutrophils Relative %: 81 %
Platelets: 200 10*3/uL (ref 150–400)
RBC: 4.09 MIL/uL — ABNORMAL LOW (ref 4.22–5.81)
RDW: 13.7 % (ref 11.5–15.5)
WBC: 9.9 10*3/uL (ref 4.0–10.5)
nRBC: 0 % (ref 0.0–0.2)
nRBC: 0 /100 WBC

## 2022-04-29 LAB — TYPE AND SCREEN
ABO/RH(D): A POS
Antibody Screen: NEGATIVE

## 2022-04-29 LAB — HEMOGLOBIN
Hemoglobin: 11.3 g/dL — ABNORMAL LOW (ref 13.0–17.0)
Hemoglobin: 11.5 g/dL — ABNORMAL LOW (ref 13.0–17.0)

## 2022-04-29 MED ORDER — MORPHINE SULFATE (PF) 4 MG/ML IV SOLN
4.0000 mg | Freq: Once | INTRAVENOUS | Status: AC
  Administered 2022-04-29: 4 mg via INTRAVENOUS
  Filled 2022-04-29: qty 1

## 2022-04-29 MED ORDER — SODIUM CHLORIDE 0.9 % IR SOLN
3000.0000 mL | Status: DC
  Administered 2022-04-29: 3000 mL

## 2022-04-29 MED ORDER — ACETAMINOPHEN 650 MG RE SUPP: 650.0000 mg | Freq: Four times a day (QID) | RECTAL | Status: DC | PRN

## 2022-04-29 MED ORDER — HYDROCODONE-ACETAMINOPHEN 5-325 MG PO TABS
1.0000 | ORAL_TABLET | ORAL | Status: DC | PRN
  Administered 2022-04-29 – 2022-05-01 (×2): 2 via ORAL
  Filled 2022-04-29 (×2): qty 2

## 2022-04-29 MED ORDER — TAMSULOSIN HCL 0.4 MG PO CAPS
0.4000 mg | ORAL_CAPSULE | Freq: Every day | ORAL | Status: DC
  Administered 2022-04-29 – 2022-05-02 (×4): 0.4 mg via ORAL
  Filled 2022-04-29 (×4): qty 1

## 2022-04-29 MED ORDER — SODIUM CHLORIDE 0.9 % IV SOLN: INTRAVENOUS | Status: DC

## 2022-04-29 MED ORDER — VITAMIN B-12 1000 MCG PO TABS
2500.0000 ug | ORAL_TABLET | Freq: Every day | ORAL | Status: DC
  Administered 2022-04-29 – 2022-05-02 (×4): 2500 ug via ORAL
  Filled 2022-04-29: qty 2.5
  Filled 2022-04-29 (×3): qty 3

## 2022-04-29 MED ORDER — IOHEXOL 300 MG/ML  SOLN
100.0000 mL | Freq: Once | INTRAMUSCULAR | Status: AC | PRN
  Administered 2022-04-29: 100 mL via INTRAVENOUS

## 2022-04-29 MED ORDER — FINASTERIDE 5 MG PO TABS
5.0000 mg | ORAL_TABLET | Freq: Every day | ORAL | Status: DC
  Administered 2022-04-29 – 2022-05-02 (×4): 5 mg via ORAL
  Filled 2022-04-29 (×4): qty 1

## 2022-04-29 MED ORDER — ONDANSETRON HCL 4 MG/2ML IJ SOLN: 4.0000 mg | Freq: Four times a day (QID) | INTRAMUSCULAR | Status: DC | PRN

## 2022-04-29 MED ORDER — LOSARTAN POTASSIUM 25 MG PO TABS
25.0000 mg | ORAL_TABLET | Freq: Every day | ORAL | Status: DC
  Administered 2022-04-29 – 2022-04-30 (×2): 25 mg via ORAL
  Filled 2022-04-29 (×2): qty 1

## 2022-04-29 MED ORDER — ACETAMINOPHEN 325 MG PO TABS: 650.0000 mg | ORAL_TABLET | Freq: Four times a day (QID) | ORAL | Status: DC | PRN

## 2022-04-29 MED ORDER — MORPHINE SULFATE (PF) 2 MG/ML IV SOLN: 2.0000 mg | INTRAVENOUS | Status: DC | PRN

## 2022-04-29 MED ORDER — ONDANSETRON HCL 4 MG PO TABS: 4.0000 mg | ORAL_TABLET | Freq: Four times a day (QID) | ORAL | Status: DC | PRN

## 2022-04-29 MED ORDER — SPIRONOLACTONE 25 MG PO TABS
25.0000 mg | ORAL_TABLET | Freq: Every day | ORAL | Status: DC
  Administered 2022-04-29 – 2022-04-30 (×2): 25 mg via ORAL
  Filled 2022-04-29 (×2): qty 1

## 2022-04-29 MED ORDER — ATORVASTATIN CALCIUM 20 MG PO TABS
40.0000 mg | ORAL_TABLET | Freq: Every day | ORAL | Status: DC
  Administered 2022-04-29 – 2022-05-02 (×4): 40 mg via ORAL
  Filled 2022-04-29 (×4): qty 2
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
## 2022-04-29 NOTE — Assessment & Plan Note (Addendum)
Patient with bladder outlet obstruction likely secondary to blood, BPH and hydronephrosis.  Appreciate urology irrigating bladder on 04/29/2022.  Urine this morning was dark yellow.  Urology was okay with removing the Foley and doing a voiding trial.  Patient was able to urinate once well without any blood but then only urinating 100 mL at a time and did have a small blood clot.  Been retaining urine of 700 mL.  Urology recommended intermittent catheterization x2 for greater than 500 mL.  Unfortunately the patient had required a Foley catheter prior to going home for greater than 600 mL in the bladder.  Will need to follow-up with urology as outpatient.  Empiric Keflex prescribed upon discharge.  Second urine culture still pending.

## 2022-04-29 NOTE — Assessment & Plan Note (Addendum)
Continue low-dose Aldactone and losartan

## 2022-04-29 NOTE — Assessment & Plan Note (Addendum)
Initially secondary to blood loss.  This has resolved.

## 2022-04-29 NOTE — Assessment & Plan Note (Addendum)
Clinically compensated Continue spironolactone, losartan Last EF 8/4 45 to 50%

## 2022-04-29 NOTE — Progress Notes (Signed)
Pelvic US shows tiny, <1cm residual blood clot in the bladder. No indication for urologic procedure today; ok to advance diet. Please continue CBI and see my prior note for recommendations regarding manual irrigation as needed.

## 2022-04-29 NOTE — Progress Notes (Signed)
  Progress Note   Patient: [name] [id number]:[id number] DOB: 04/17/1935 DOA: 04/28/2022     0 DOS: the patient was seen and examined on 04/29/2022     Assessment and Plan: * Gross hematuria Patient with bladder outlet obstruction likely secondary to blood, BPH and hydronephrosis.  Appreciate urology irrigating bladder.  Continuous bladder irrigation.  Gentle IV fluids.  Foley catheter.  As per urology continue to hold aspirin and Brilinta for right now.  Postural dizziness with presyncope Likely related to low blood levels.  Continue gentle fluids.  HFrEF secondary to ischemic cardiomyopathy (heart failure with reduced ejection fraction) (HCC) Clinically compensated Continue spironolactone, losartan Last EF 8/4 45 to 50%   History of ST elevation myocardial infarction with stent to LAD 11/2021 (STEMI) Hopefully can start on Plavix tomorrow morning. Continue atorvastatin.  Not on beta-blocker due to intolerance   HTN (hypertension) Continue home meds        Subjective: Patient coming in with bleeding in the urine for couple days.  He is on aspirin and Brilinta secondary to STEMI back in May.  Physical Exam: Vitals:   04/29/22 1030 04/29/22 1100 04/29/22 1130 04/29/22 1200  BP: 121/64 138/72 113/68 109/76  Pulse: 70 76    Resp: (!) 22 (!) 25 (!) 27 (!) 23  Temp:      TempSrc:      SpO2: 100% 98% 97% 100%  Weight:      Height:       Physical Exam HENT:     Head: Normocephalic.     Mouth/Throat:     Pharynx: No oropharyngeal exudate.  Eyes:     General: Lids are normal.     Conjunctiva/sclera: Conjunctivae normal.  Cardiovascular:     Rate and Rhythm: Normal rate and regular rhythm.     Heart sounds: Normal heart sounds, S1 normal and S2 normal.  Pulmonary:     Breath sounds: No decreased breath sounds, wheezing, rhonchi or rales.  Abdominal:     Palpations: Abdomen is soft.     Tenderness: There is no abdominal tenderness.  Genitourinary:    Comments: Blood  seen in Foley Musculoskeletal:     Right lower leg: No swelling.     Left lower leg: No swelling.  Skin:    General: Skin is warm.     Findings: No rash.  Neurological:     Mental Status: He is alert and oriented to person, place, and time.     Data Reviewed: Last hemoglobin 11.5 CT scan shows an 8 cm hyperdense collection within the bladder lumen consistent with blood clot.  Results of bladder outlet obstruction with moderate bladder distention and mild bilateral hydronephrosis.  Family Communication: Tried to reach patient's wife on the phone  Disposition: Status is: Inpatient Remains inpatient appropriate because: Still with hematuria.  Unfortunately needs to be on blood thinners with STEMI back in May.  Planned Discharge Destination: Home    Time spent: 28 minutes  Author: Loletha Grayer, MD 04/29/2022 12:25 PM  For on call review [email or web address].

## 2022-04-29 NOTE — Assessment & Plan Note (Addendum)
Restarted Plavix. Continue atorvastatin.  Not on beta-blocker due to intolerance

## 2022-04-29 NOTE — ED Notes (Addendum)
Dr. Duncan @ the bedside. 
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
## 2022-04-30 DIAGNOSIS — N1330 Unspecified hydronephrosis: Secondary | ICD-10-CM | POA: Diagnosis not present

## 2022-04-30 DIAGNOSIS — R310 Gross hematuria: Secondary | ICD-10-CM | POA: Diagnosis not present

## 2022-04-30 DIAGNOSIS — R338 Other retention of urine: Secondary | ICD-10-CM | POA: Diagnosis not present

## 2022-04-30 DIAGNOSIS — I25118 Atherosclerotic heart disease of native coronary artery with other forms of angina pectoris: Secondary | ICD-10-CM | POA: Diagnosis not present

## 2022-04-30 DIAGNOSIS — N320 Bladder-neck obstruction: Secondary | ICD-10-CM | POA: Diagnosis not present

## 2022-04-30 LAB — CULTURE, URINE COMPREHENSIVE

## 2022-04-30 LAB — BASIC METABOLIC PANEL
Anion gap: 3 — ABNORMAL LOW (ref 5–15)
BUN: 19 mg/dL (ref 8–23)
CO2: 24 mmol/L (ref 22–32)
Calcium: 8.3 mg/dL — ABNORMAL LOW (ref 8.9–10.3)
Chloride: 109 mmol/L (ref 98–111)
Creatinine, Ser: 0.64 mg/dL (ref 0.61–1.24)
GFR, Estimated: >60 mL/min (ref >60)
Glucose, Bld: 99 mg/dL (ref 70–99)
Potassium: 3.7 mmol/L (ref 3.5–5.1)
Sodium: 136 mmol/L (ref 135–145)

## 2022-04-30 LAB — CBC
HCT: 30.8 % — ABNORMAL LOW (ref 39.0–52.0)
Hemoglobin: 10.3 g/dL — ABNORMAL LOW (ref 13.0–17.0)
MCH: 31.4 pg (ref 26.0–34.0)
MCHC: 33.4 g/dL (ref 30.0–36.0)
MCV: 93.9 fL (ref 80.0–100.0)
Platelets: 137 10*3/uL — ABNORMAL LOW (ref 150–400)
RBC: 3.28 MIL/uL — ABNORMAL LOW (ref 4.22–5.81)
RDW: 14 % (ref 11.5–15.5)
WBC: 7.5 10*3/uL (ref 4.0–10.5)
nRBC: 0 % (ref 0.0–0.2)

## 2022-04-30 LAB — URINE CULTURE: Culture: NO GROWTH

## 2022-04-30 MED ORDER — CLOPIDOGREL BISULFATE 75 MG PO TABS
75.0000 mg | ORAL_TABLET | Freq: Every day | ORAL | Status: DC
  Administered 2022-04-30 – 2022-05-02 (×3): 75 mg via ORAL
  Filled 2022-04-30 (×3): qty 1

## 2022-04-30 MED ORDER — POLYETHYLENE GLYCOL 3350 17 G PO PACK
17.0000 g | PACK | Freq: Every day | ORAL | Status: DC | PRN
  Administered 2022-04-30: 17 g via ORAL
  Filled 2022-04-30: qty 1

## 2022-04-30 MED ORDER — SODIUM CHLORIDE 0.9 % IV SOLN
1.0000 g | INTRAVENOUS | Status: DC
  Administered 2022-04-30 – 2022-05-02 (×2): 1 g via INTRAVENOUS
  Filled 2022-04-30 (×3): qty 10
  Filled 2022-04-30: qty 1

## 2022-04-30 NOTE — TOC Initial Note (Signed)
Transition of Care [facility]) - Initial/Assessment Note    Patient Details  Name: [name] MRN: [id number] Date of Birth: 06-21-1935  [facility]) CM/SW Contact:    Conception Oms, RN Phone Number: 04/30/2022, 9:28 AM  Clinical Narrative:                 Patient is a cardiac rehab patient Three-way Foley catheter was placed and CBI started  From home with his wife [name] PCP [facility] for meds as well  Transition of Care [facility] Screening Note   Patient Details  Name: [name] Date of Birth: February 19, 1935   Transition of Care [facility]) CM/SW Contact:    Conception Oms, RN Phone Number: 04/30/2022, 9:29 AM    Transition of Care Department [facility]) has reviewed patient and no TOC needs have been identified at this time. We will continue to monitor patient advancement through interdisciplinary progression rounds. If new patient transition needs arise, please place a TOC consult.    Expected Discharge Plan: Home/Self Care Barriers to Discharge: Continued Medical Work up   Patient Goals and CMS Choice        Expected Discharge Plan and Services Expected Discharge Plan: Home/Self Care   Discharge Planning Services: CM Consult   Living arrangements for the past 2 months: Single Family Home                                      Prior Living Arrangements/Services Living arrangements for the past 2 months: Single Family Home Lives with:: Spouse                   Activities of Daily Living Home Assistive Devices/Equipment: Scales ADL Screening (condition at time of admission) Patient's cognitive ability adequate to safely complete daily activities?: Yes Is the patient deaf or have difficulty hearing?: No Does the patient have difficulty seeing, even when wearing glasses/contacts?: No Does the patient have difficulty concentrating, remembering, or making decisions?: No Patient able to  express need for assistance with ADLs?: Yes Does the patient have difficulty dressing or bathing?: No Independently performs ADLs?: Yes (appropriate for developmental age) Does the patient have difficulty walking or climbing stairs?: No Weakness of Legs: None Weakness of Arms/Hands: None  Permission Sought/Granted                  Emotional Assessment              Admission diagnosis:  Gross hematuria [R31.0] Bilateral hydronephrosis [N13.30] Acute suprapubic pain [R10.2] Acute urinary retention [R33.8] Bladder outlet obstruction [N32.0] Patient Active Problem List   Diagnosis Date Noted   Gross hematuria 04/29/2022   History of ST elevation myocardial infarction with stent to LAD 11/2021 (STEMI) 04/29/2022   HFrEF secondary to ischemic cardiomyopathy (heart failure with reduced ejection fraction) ([place]) 04/29/2022   Acute suprapubic pain    Bilateral hydronephrosis    Bladder outlet obstruction    Ischemic cardiomyopathy    Vasovagal episode    Acute urinary retention    Hematuria 02/03/2022   Balanitis 02/03/2022   Coronary artery disease of native artery of native heart with stable angina pectoris ([place]) 22/09/5425   Chronic systolic heart failure ([place]) 12/24/2021   STEMI involving left anterior descending coronary artery ([place]) 12/23/2021   STEMI (ST elevation myocardial infarction) ([place]) 12/23/2021   ST elevation myocardial infarction  involving left anterior descending (LAD) coronary artery (HCC)    Constipation 10/16/2018   Polymyalgia rheumatica ([place]) 07/04/2015   Postural dizziness with presyncope 08/21/2014   Advance directive discussed with patient 08/21/2014   Routine general medical examination at a health care facility 08/11/2011   BPH with obstruction/lower urinary tract symptoms 07/11/2007   HEARING LOSS 01/04/2007   ANEMIA, PERNICIOUS 11/02/2006   HTN (hypertension) 11/02/2006   Osteoarthritis, multiple sites 11/02/2006   DVT, HX OF 11/02/2006    PCP:  Venia Carbon, MD Pharmacy:   [facility] [place], [place] [address] Phone: [phone] Fax: [place], [place] [address] Phone: [phone] Fax: [place] [address] Phone: [phone] Fax: [phone]     Social Determinants of Health (SDOH) Interventions    Readmission Risk Interventions     No data to display

## 2022-04-30 NOTE — Progress Notes (Signed)
Rounding Note    Patient Name: [name] Date of Encounter: 04/30/2022  [place] Cardiologist: None   Subjective   Hgb continues to trend down, 10.3 this morning. Patient is feeling better after the blood clot passed. No obvious blood noted in urine this morning. He denies chest or SOB.   Inpatient Medications    Scheduled Meds:  atorvastatin  40 mg Oral Daily   cyanocobalamin  2,500 mcg Oral Daily   finasteride  5 mg Oral Daily   losartan  25 mg Oral Daily   spironolactone  25 mg Oral Daily   tamsulosin  0.4 mg Oral Daily   Continuous Infusions:  sodium chloride 30 mL/hr at 04/29/22 1528   sodium chloride irrigation     PRN Meds: acetaminophen **OR** acetaminophen, HYDROcodone-acetaminophen, morphine injection, ondansetron **OR** ondansetron (ZOFRAN) IV   Vital Signs    Vitals:   04/29/22 1330 04/29/22 1427 04/29/22 1614 04/29/22 2301  BP: 109/63 110/62 (!) 101/54 (!) 110/59  Pulse: 89 73 (!) 58 67  Resp: (!) '23 18  18  '$ Temp:  98 F (36.7 C) 98.2 F (36.8 C) 98.2 F (36.8 C)  TempSrc:    Oral  SpO2: 97% 95% 98% 97%  Weight:      Height:        Intake/Output Summary (Last 24 hours) at 04/30/2022 0737 Last data filed at 04/30/2022 0557 Gross per 24 hour  Intake 4701.67 ml  Output 4450 ml  Net 251.67 ml      04/28/2022   10:46 PM 04/28/2022    5:21 PM 03/22/2022    8:36 AM  Last 3 Weights  Weight (lbs) 174 lb 174 lb 3.2 oz 174 lb  Weight (kg) 78.926 kg 79.017 kg 78.926 kg      Telemetry    NSR, HR 60-80s, PACs - Personally Reviewed  ECG    No new - Personally Reviewed  Physical Exam   GEN: No acute distress.   Neck: No JVD Cardiac: RRR, no murmurs, rubs, or gallops.  Respiratory: Clear to auscultation bilaterally. GI: Soft, nontender, non-distended  MS: No edema; No deformity. Neuro:  Nonfocal  Psych: Normal affect   Labs    High Sensitivity Troponin:  No results for input(s): "TROPONINIHS" in the last 720 hours.    Chemistry Recent Labs  Lab 04/28/22 2253  NA 138  K 3.8  CL 103  CO2 27  GLUCOSE 146*  BUN 16  CREATININE 0.80  CALCIUM 9.2  PROT 7.3  ALBUMIN 4.1  AST 30  ALT 18  ALKPHOS 92  BILITOT 1.0  GFRNONAA >60  ANIONGAP 8    Lipids No results for input(s): "CHOL", "TRIG", "HDL", "LABVLDL", "LDLCALC", "CHOLHDL" in the last 168 hours.  Hematology Recent Labs  Lab 04/28/22 2253 04/29/22 0523 04/29/22 1113 04/30/22 0607  WBC 9.9  10.3  --   --  7.5  RBC 4.09*  4.19*  --   --  3.28*  HGB 12.8*  12.7* 11.3* 11.5* 10.3*  HCT 38.5*  39.4  --   --  30.8*  MCV 94.1  94.0  --   --  93.9  MCH 31.3  30.3  --   --  31.4  MCHC 33.2  32.2  --   --  33.4  RDW 13.7  13.7  --   --  14.0  PLT 200  199  --   --  137*   Thyroid No results for input(s): "TSH", "FREET4" in the  last 168 hours.  BNPNo results for input(s): "BNP", "PROBNP" in the last 168 hours.  DDimer No results for input(s): "DDIMER" in the last 168 hours.   Radiology    US PELVIS LIMITED (TRANSABDOMINAL ONLY)  Result Date: 04/29/2022 CLINICAL DATA:  Gross hematuria. EXAM: LIMITED ULTRASOUND OF PELVIS TECHNIQUE: Limited transabdominal ultrasound examination of the pelvis was performed. COMPARISON:  04/29/2022 CT abdomen/pelvis. FINDINGS: Initial images demonstrate bladder completely collapsed by indwelling Foley catheter. Subsequent bladder filling demonstrates mild diffuse bladder wall thickening with scattered small bladder diverticula, largest 1.5 cm in the left superior bladder wall. Tiny hypoechoic 0.9 cm focus along the midline posterior bladder wall on the cine sequence. Otherwise no discrete bladder mass or layering debris. IMPRESSION: Well-positioned Foley catheter. Nonspecific mild diffuse bladder wall thickening with scattered small bladder diverticula, most commonly due to chronic bladder outlet obstruction. Tiny hypoechoic 0.9 cm focus along the midline posterior bladder wall, which could represent adherent  blood clot, with tiny bladder wall neoplasm not excluded. Cystoscopic correlation versus follow-up bilateral ultrasound may be considered. Electronically Signed   By: Ilona Sorrel M.D.   On: 04/29/2022 11:20   CT HEMATURIA WORKUP  Result Date: 04/29/2022 CLINICAL DATA:  Hematuria EXAM: CT ABDOMEN AND PELVIS WITHOUT AND WITH CONTRAST TECHNIQUE: Multidetector CT imaging of the abdomen and pelvis was performed following the standard protocol before and following the bolus administration of intravenous contrast. RADIATION DOSE REDUCTION: This exam was performed according to the departmental dose-optimization program which includes automated exposure control, adjustment of the mA and/or kV according to patient size and/or use of iterative reconstruction technique. CONTRAST:  118m OMNIPAQUE IOHEXOL 300 MG/ML  SOLN COMPARISON:  03/05/2022 FINDINGS: Lower chest: Mild bibasilar atelectasis. No significant coronary artery calcification. Cardiac size is within normal limits. Small hiatal hernia. Hepatobiliary: No focal liver abnormality is seen. No gallstones, gallbladder wall thickening, or biliary dilatation. Pancreas: Hypoenhancing focus within the uncinate process of the pancreas at axial image # 32/4 appears stable since remote prior examination of 12/09/2006 and likely represents focal fatty atrophy/infiltration. The pancreas is otherwise unremarkable. Spleen: Unremarkable Adrenals/Urinary Tract: The adrenal glands are unremarkable. The kidneys are normal in size and position. 2 mm nonobstructing calculi are noted within the lower pole of the left kidney. No ureteral calculi. Simple cortical cyst is seen within the interpolar region of the left kidney. No follow-up imaging is recommended for this lesion. There is mild bilateral hydronephrosis. The bladder is distended. There is a 8 cm hyperdense collection within the bladder lumen dependently most in keeping with a blood clot which appears to arise from the a right  lobe of the prostate gland. Small bladder diverticula again noted suggesting changes of chronic bladder outlet obstruction. Stomach/Bowel: Stomach is within normal limits. Appendix appears normal. No evidence of bowel wall thickening, distention, or inflammatory changes. Vascular/Lymphatic: Aortic atherosclerosis. No enlarged abdominal or pelvic lymph nodes. Reproductive: The prostate gland is moderately enlarged. Other: Small fat containing right inguinal hernia. Musculoskeletal: Degenerative changes are seen within the lumbar spine and hips bilaterally. No acute bone abnormality. IMPRESSION: 1. 8 cm hyperdense collection within the bladder lumen dependently most in keeping with a blood clot which appears to arise from the right lobe of the prostate gland. This results in bladder outlet obstruction with moderate bladder distension and mild bilateral hydronephrosis. 2. Minimal left nonobstructing nephrolithiasis. No ureteral calculi. 3. Moderate prostatic enlargement with evidence of superimposed chronic bladder outlet obstruction. Aortic Atherosclerosis (ICD10-I70.0). Electronically Signed   By: ALinwood DibblesD.  On: 04/29/2022 02:14    Cardiac Studies   Limited echo 03/2022  1. Left ventricular ejection fraction, by estimation, is 45 to 50%. The  left ventricle has mildly decreased function. The left ventricle  demonstrates global hypokinesis. Left ventricular diastolic parameters are  consistent with Grade I diastolic  dysfunction (impaired relaxation). The average left ventricular global  longitudinal strain is -15.9 %.   2. Right ventricular systolic function is normal. The right ventricular  size is normal. Tricuspid regurgitation signal is inadequate for assessing  PA pressure.   3. The mitral valve is normal in structure. Mild mitral valve  regurgitation.   4. The aortic valve is normal in structure.   5. The inferior vena cava is normal in size with greater than 50%  respiratory  variability, suggesting right atrial pressure of 3 mmHg.   Comparison(s): Previous LVEF reported as 35-40%.    LHC 12/23/2021: Conclusions: Severe single-vessel coronary artery disease with thrombotic 99% stenosis involving the proximal/mid LAD consistent with acute plaque rupture.  There is also a calcified 50-60% stenosis involving the mid LAD at the takeoff of D1. Moderate, nonobstructive coronary artery disease with 50-60% disease involving mid and distal portions of codominant RCA.  No significant disease is identified in the LCx. Successful PCI to proximal/mid LAD using Onyx Frontier 3.5 x 22 mm drug-eluting stent with 0% residual stenosis and TIMI-3 flow. Tortuous right subclavian/innominate artery making catheter manipulation challenging.  Consider alternative access for future catheterizations.   Recommendations: Dual antiplatelet therapy with aspirin and ticagrelor for at least 12 months. Aggressive secondary prevention of coronary artery disease, including high intensity statin therapy. Obtain echocardiogram to assess LVEF. Start metoprolol succinate 25 mg daily, with judicious escalation as tolerated. Cardiac rehab referral placed. __________   2D echo 12/23/2021: 1. Left ventricular ejection fraction, by estimation, is 35 to 40%. The  left ventricle has moderately decreased function. The left ventricle  demonstrates regional wall motion abnormalities (see scoring  diagram/findings for description). There is mild  left ventricular hypertrophy. Left ventricular diastolic parameters are  consistent with Grade II diastolic dysfunction (pseudonormalization).  There is akinesis of the left ventricular, mid-apical anteroseptal wall  and apical segment. The average left  ventricular global longitudinal strain is -10.4 %. The global longitudinal  strain is abnormal.   2. Right ventricular systolic function is low normal. The right  ventricular size is normal.   3. The mitral valve  is normal in structure. Mild mitral valve  regurgitation.   4. The aortic valve is tricuspid. Aortic valve regurgitation is not  visualized.    Patient Profile     87 y.o. male with a hx of HTN, DVT, BPH, chronic HFrEF, ICM LVEF 45-50%, and STEMI s/p DES LAD in May 2023 who is being seen 04/29/2022 for the evaluation of hematuria.  Assessment & Plan    CAD s/p DES LAD 12/23/21 - patient presented with gross hematuria. Hgb 12.8, down from 14 - ASA and Brilinta held - Hgb continues to trend downward - no plan for urology procedures thus far, continue with CBI - patient denies anginal symptoms - patient ultimately only tolerate monotherapy with Plavix, will discuss with MD. Given downtrend of Hgb, may hold on starting Plavix today.   Gross hematuria Anemia - patient was following with urology for microscopic hematuria PTA  - Hgb 12.8 on arrival. It was 14 in 12/2021. He did not require blood transfusion - Hgb today is 10.3 - CT showed 8cm hyperdense collection within the  bladder lumen, blood clot arising from the prostate gland, foley was placed, undergoing CBI - Urology consulted - US showed <1cm residual blood clot in the bladder, continue to watch and wait - no overt blood noted in urine   ICM HFrEF - repeat limited echo 03/2022 showed LVEF 45-50%, G1DD - He appears euvolemic on exam - continue PTA Losartan '25mg'$  daily and spironolactone '25mg'$  daily.  - No BB with bradycardia   Hypotension - BP improved with IVF    For questions or updates, please contact [place] Please consult [email or web address] for contact info under        Signed, Jaequan Propes Ninfa Meeker, PA-C  04/30/2022, 7:37 AM

## 2022-04-30 NOTE — Progress Notes (Signed)
  Progress Note   Patient: [name]:[id number] DOB: Feb 13, 1935 DOA: 04/28/2022     1 DOS: the patient was seen and examined on 04/30/2022     Assessment and Plan: * Gross hematuria Patient with bladder outlet obstruction likely secondary to blood, BPH and hydronephrosis.  Appreciate urology irrigating bladder on 04/29/2022.  Patient with urine yellow and leave Foley cath for today, urology was okay with restarting Plavix.  Postural dizziness with presyncope Ambulate today    HFrEF secondary to ischemic cardiomyopathy (heart failure with reduced ejection fraction) (HCC) Clinically compensated Continue spironolactone, losartan Last EF 8/4 45 to 50%   History of ST elevation myocardial infarction with stent to LAD 11/2021 (STEMI) Restarted Plavix. Continue atorvastatin.  Not on beta-blocker due to intolerance   HTN (hypertension) Continue home meds        Subjective: Patient seen this morning for injection with urology.  His urine was yellow in the catheter.  This morning he was on CBI treatment.  Neurology okayed the starting of Plavix.  Physical Exam: Vitals:   04/29/22 1427 04/29/22 1614 04/29/22 2301 04/30/22 0811  BP: 110/62 (!) 101/54 (!) 110/59 109/66  Pulse: 73 (!) 58 67 61  Resp: 18  18   Temp: 98 F (36.7 C) 98.2 F (36.8 C) 98.2 F (36.8 C) 98.1 F (36.7 C)  TempSrc:   Oral   SpO2: 95% 98% 97% 96%  Weight:      Height:       Physical Exam HENT:     Head: Normocephalic.     Mouth/Throat:     Pharynx: No oropharyngeal exudate.  Eyes:     General: Lids are normal.     Conjunctiva/sclera: Conjunctivae normal.  Cardiovascular:     Rate and Rhythm: Normal rate and regular rhythm.     Heart sounds: Normal heart sounds, S1 normal and S2 normal.  Pulmonary:     Breath sounds: No decreased breath sounds, wheezing, rhonchi or rales.  Abdominal:     Palpations: Abdomen is soft.     Tenderness: There is no abdominal tenderness.  Genitourinary:     Comments: Urine this morning was yellow. Musculoskeletal:     Right lower leg: No swelling.     Left lower leg: No swelling.  Skin:    General: Skin is warm.     Findings: No rash.  Neurological:     Mental Status: He is alert and oriented to person, place, and time.     Data Reviewed: Hemoglobin 10.3, platelet count 137  Family Communication: Updated patient's wife on the phone  Disposition: Status is: Inpatient Remains inpatient appropriate because: Watch for bleeding with restarting Plavix.  Planned Discharge Destination: Home    Time spent: 28 minutes  Author: Loletha Grayer, MD 04/30/2022 3:03 PM  For on call review [email or web address].

## 2022-04-30 NOTE — Plan of Care (Signed)

## 2022-04-30 NOTE — Progress Notes (Signed)
Urology Inpatient Progress Note  Subjective: No acute events overnight.  He is afebrile, VSS. Hemoglobin down today, 10.3. Foley catheter in place draining clear, yellow urine on slow drip CBI. He denies any pain today.  Abdomen is soft, nontender, and nondistended.  Anti-infectives: Anti-infectives (From admission, onward)    None      Current Facility-Administered Medications  Medication Dose Route Frequency Provider Last Rate Last Admin   acetaminophen (TYLENOL) tablet 650 mg  650 mg Oral Q6H PRN Athena Masse, MD       Or   acetaminophen (TYLENOL) suppository 650 mg  650 mg Rectal Q6H PRN Athena Masse, MD       atorvastatin (LIPITOR) tablet 40 mg  40 mg Oral Daily Athena Masse, MD   40 mg at 04/29/22 7673   clopidogrel (PLAVIX) tablet 75 mg  75 mg Oral Daily Wieting, Richard, MD       cyanocobalamin (VITAMIN B12) tablet 2,500 mcg  2,500 mcg Oral Daily Athena Masse, MD   2,500 mcg at 04/29/22 4193   finasteride (PROSCAR) tablet 5 mg  5 mg Oral Daily Athena Masse, MD   5 mg at 04/29/22 7902   HYDROcodone-acetaminophen (NORCO/VICODIN) 5-325 MG per tablet 1-2 tablet  1-2 tablet Oral Q4H PRN Athena Masse, MD   2 tablet at 04/29/22 0805   losartan (COZAAR) tablet 25 mg  25 mg Oral Daily Judd Gaudier V, MD   25 mg at 04/29/22 0951   morphine (PF) 2 MG/ML injection 2 mg  2 mg Intravenous Q2H PRN Athena Masse, MD       ondansetron [facility]) tablet 4 mg  4 mg Oral Q6H PRN Athena Masse, MD       Or   ondansetron [facility]) injection 4 mg  4 mg Intravenous Q6H PRN Athena Masse, MD       sodium chloride irrigation 0.9 % 3,000 mL  3,000 mL Irrigation Continuous Hinda Kehr, MD   3,000 mL at 04/29/22 0345   spironolactone (ALDACTONE) tablet 25 mg  25 mg Oral Daily Athena Masse, MD   25 mg at 04/29/22 0951   tamsulosin (FLOMAX) capsule 0.4 mg  0.4 mg Oral Daily Judd Gaudier V, MD   0.4 mg at 04/29/22 0951   Objective: Vital signs in last 24 hours: Temp:  [98 F  (36.7 C)-98.2 F (36.8 C)] 98.1 F (36.7 C) (09/29 0811) Pulse Rate:  [58-89] 61 (09/29 0811) Resp:  [15-27] 18 (09/28 2301) BP: (91-156)/(54-77) 109/66 (09/29 0811) SpO2:  [95 %-100 %] 96 % (09/29 0811)  Intake/Output from previous day: 09/28 0701 - 09/29 0700 In: 4701.7 [P.O.:720; I.V.:981.7] Out: 6750 [Urine:6750] Intake/Output this shift: No intake/output data recorded.  Physical Exam Vitals and nursing note reviewed.  Constitutional:      General: He is not in acute distress.    Appearance: He is not ill-appearing, toxic-appearing or diaphoretic.  HENT:     Head: Normocephalic and atraumatic.  Pulmonary:     Effort: Pulmonary effort is normal. No respiratory distress.  Skin:    General: Skin is warm and dry.  Neurological:     Mental Status: He is alert and oriented to person, place, and time.  Psychiatric:        Mood and Affect: Mood normal.        Behavior: Behavior normal.    Lab Results:  Recent Labs    04/28/22 2253 04/29/22 0523 04/29/22 1113 04/30/22 0607  WBC  9.9  10.3  --   --  7.5  HGB 12.8*  12.7*   < > 11.5* 10.3*  HCT 38.5*  39.4  --   --  30.8*  PLT 200  199  --   --  137*   < > = values in this interval not displayed.   BMET Recent Labs    04/28/22 2253 04/30/22 0607  NA 138 136  K 3.8 3.7  CL 103 109  CO2 27 24  GLUCOSE 146* 99  BUN 16 19  CREATININE 0.80 0.64  CALCIUM 9.2 8.3*   PT/INR Recent Labs    04/28/22 2253  LABPROT 14.2  INR 1.1   Studies/Results: US PELVIS LIMITED (TRANSABDOMINAL ONLY)  Result Date: 04/29/2022 CLINICAL DATA:  Gross hematuria. EXAM: LIMITED ULTRASOUND OF PELVIS TECHNIQUE: Limited transabdominal ultrasound examination of the pelvis was performed. COMPARISON:  04/29/2022 CT abdomen/pelvis. FINDINGS: Initial images demonstrate bladder completely collapsed by indwelling Foley catheter. Subsequent bladder filling demonstrates mild diffuse bladder wall thickening with scattered small bladder  diverticula, largest 1.5 cm in the left superior bladder wall. Tiny hypoechoic 0.9 cm focus along the midline posterior bladder wall on the cine sequence. Otherwise no discrete bladder mass or layering debris. IMPRESSION: Well-positioned Foley catheter. Nonspecific mild diffuse bladder wall thickening with scattered small bladder diverticula, most commonly due to chronic bladder outlet obstruction. Tiny hypoechoic 0.9 cm focus along the midline posterior bladder wall, which could represent adherent blood clot, with tiny bladder wall neoplasm not excluded. Cystoscopic correlation versus follow-up bilateral ultrasound may be considered. Electronically Signed   By: Ilona Sorrel M.D.   On: 04/29/2022 11:20   CT HEMATURIA WORKUP  Result Date: 04/29/2022 CLINICAL DATA:  Hematuria EXAM: CT ABDOMEN AND PELVIS WITHOUT AND WITH CONTRAST TECHNIQUE: Multidetector CT imaging of the abdomen and pelvis was performed following the standard protocol before and following the bolus administration of intravenous contrast. RADIATION DOSE REDUCTION: This exam was performed according to the departmental dose-optimization program which includes automated exposure control, adjustment of the mA and/or kV according to patient size and/or use of iterative reconstruction technique. CONTRAST:  177m OMNIPAQUE IOHEXOL 300 MG/ML  SOLN COMPARISON:  03/05/2022 FINDINGS: Lower chest: Mild bibasilar atelectasis. No significant coronary artery calcification. Cardiac size is within normal limits. Small hiatal hernia. Hepatobiliary: No focal liver abnormality is seen. No gallstones, gallbladder wall thickening, or biliary dilatation. Pancreas: Hypoenhancing focus within the uncinate process of the pancreas at axial image # 32/4 appears stable since remote prior examination of 12/09/2006 and likely represents focal fatty atrophy/infiltration. The pancreas is otherwise unremarkable. Spleen: Unremarkable Adrenals/Urinary Tract: The adrenal glands are  unremarkable. The kidneys are normal in size and position. 2 mm nonobstructing calculi are noted within the lower pole of the left kidney. No ureteral calculi. Simple cortical cyst is seen within the interpolar region of the left kidney. No follow-up imaging is recommended for this lesion. There is mild bilateral hydronephrosis. The bladder is distended. There is a 8 cm hyperdense collection within the bladder lumen dependently most in keeping with a blood clot which appears to arise from the a right lobe of the prostate gland. Small bladder diverticula again noted suggesting changes of chronic bladder outlet obstruction. Stomach/Bowel: Stomach is within normal limits. Appendix appears normal. No evidence of bowel wall thickening, distention, or inflammatory changes. Vascular/Lymphatic: Aortic atherosclerosis. No enlarged abdominal or pelvic lymph nodes. Reproductive: The prostate gland is moderately enlarged. Other: Small fat containing right inguinal hernia. Musculoskeletal: Degenerative changes are seen within  the lumbar spine and hips bilaterally. No acute bone abnormality. IMPRESSION: 1. 8 cm hyperdense collection within the bladder lumen dependently most in keeping with a blood clot which appears to arise from the right lobe of the prostate gland. This results in bladder outlet obstruction with moderate bladder distension and mild bilateral hydronephrosis. 2. Minimal left nonobstructing nephrolithiasis. No ureteral calculi. 3. Moderate prostatic enlargement with evidence of superimposed chronic bladder outlet obstruction. Aortic Atherosclerosis (ICD10-I70.0). Electronically Signed   By: Fidela Salisbury M.D.   On: 04/29/2022 02:14    Assessment & Plan: 87 year old male with BPH with LUTS on Flomax and finasteride, s/p STEMI in May 2023 on aspirin and Brilinta, and recurrent gross hematuria with benign work-up with Dr. Bernardo Heater earlier this year presents with gross hematuria, now on CBI.  His hemoglobin is  slightly down today, but gross hematuria has resolved on slow drip CBI.  I stopped his CBI this morning and we will continue to monitor.  He will be restarted on Plavix this morning.  We will reassess his urine output at midday and titrate CBI as needed.  If his urine remains light pink or clear in color, okay to remove Foley catheter tomorrow morning.  I recommend keeping Foley catheter in place until at least that point in case his bleeding resumes once he restarts antiplatelet agents.  Debroah Loop, PA-C 04/30/2022

## 2022-05-01 DIAGNOSIS — R338 Other retention of urine: Secondary | ICD-10-CM | POA: Diagnosis not present

## 2022-05-01 DIAGNOSIS — R42 Dizziness and giddiness: Secondary | ICD-10-CM | POA: Diagnosis not present

## 2022-05-01 DIAGNOSIS — I25118 Atherosclerotic heart disease of native coronary artery with other forms of angina pectoris: Secondary | ICD-10-CM | POA: Diagnosis not present

## 2022-05-01 DIAGNOSIS — I252 Old myocardial infarction: Secondary | ICD-10-CM | POA: Diagnosis not present

## 2022-05-01 DIAGNOSIS — N1330 Unspecified hydronephrosis: Secondary | ICD-10-CM | POA: Diagnosis not present

## 2022-05-01 DIAGNOSIS — R310 Gross hematuria: Secondary | ICD-10-CM | POA: Diagnosis not present

## 2022-05-01 LAB — BASIC METABOLIC PANEL
Anion gap: 5 (ref 5–15)
BUN: 17 mg/dL (ref 8–23)
CO2: 26 mmol/L (ref 22–32)
Calcium: 8.5 mg/dL — ABNORMAL LOW (ref 8.9–10.3)
Chloride: 106 mmol/L (ref 98–111)
Creatinine, Ser: 0.68 mg/dL (ref 0.61–1.24)
GFR, Estimated: >60 mL/min (ref >60)
Glucose, Bld: 100 mg/dL — ABNORMAL HIGH (ref 70–99)
Potassium: 4 mmol/L (ref 3.5–5.1)
Sodium: 137 mmol/L (ref 135–145)

## 2022-05-01 LAB — CBC
HCT: 31.1 % — ABNORMAL LOW (ref 39.0–52.0)
Hemoglobin: 10.3 g/dL — ABNORMAL LOW (ref 13.0–17.0)
MCH: 31.1 pg (ref 26.0–34.0)
MCHC: 33.1 g/dL (ref 30.0–36.0)
MCV: 94 fL (ref 80.0–100.0)
Platelets: 141 10*3/uL — ABNORMAL LOW (ref 150–400)
RBC: 3.31 MIL/uL — ABNORMAL LOW (ref 4.22–5.81)
RDW: 13.7 % (ref 11.5–15.5)
WBC: 7.9 10*3/uL (ref 4.0–10.5)
nRBC: 0 % (ref 0.0–0.2)

## 2022-05-01 MED ORDER — SPIRONOLACTONE 25 MG PO TABS
12.5000 mg | ORAL_TABLET | Freq: Every day | ORAL | Status: DC
  Administered 2022-05-01 – 2022-05-02 (×2): 12.5 mg via ORAL
  Filled 2022-05-01: qty 0.5
  Filled 2022-05-01: qty 1
  Filled 2022-05-01: qty 0.5

## 2022-05-01 MED ORDER — POLYETHYLENE GLYCOL 3350 17 G PO PACK: 17.0000 g | PACK | Freq: Every day | ORAL | 0 refills | Status: DC | PRN

## 2022-05-01 MED ORDER — CEPHALEXIN 500 MG PO CAPS: 500.0000 mg | ORAL_CAPSULE | Freq: Three times a day (TID) | ORAL | 0 refills | Status: DC

## 2022-05-01 MED ORDER — CLOPIDOGREL BISULFATE 75 MG PO TABS: 75.0000 mg | ORAL_TABLET | Freq: Every day | ORAL | 0 refills | Status: DC

## 2022-05-01 MED ORDER — LOSARTAN POTASSIUM 25 MG PO TABS
25.0000 mg | ORAL_TABLET | Freq: Every day | ORAL | Status: DC
  Administered 2022-05-01 – 2022-05-02 (×2): 25 mg via ORAL
  Filled 2022-05-01 (×2): qty 1

## 2022-05-01 NOTE — Progress Notes (Signed)
Urology Inpatient Progress Note  Subjective: CBI off since yesterday.  No difficulty with the catheter straight drainage.  No ongoing hematuria. Patient has no complaints today.  Anti-infectives: Anti-infectives (From admission, onward)    Start     Dose/Rate Route Frequency Ordered Stop   05/01/22 0000  cephALEXin (KEFLEX) 500 MG capsule        500 mg Oral 3 times daily 05/01/22 0947 05/04/22 2359   04/30/22 1000  cefTRIAXone (ROCEPHIN) 1 g in sodium chloride 0.9 % 100 mL IVPB        1 g 200 mL/hr over 30 Minutes Intravenous Every 24 hours 04/30/22 0902        Current Facility-Administered Medications  Medication Dose Route Frequency Provider Last Rate Last Admin   acetaminophen (TYLENOL) tablet 650 mg  650 mg Oral Q6H PRN Athena Masse, MD       Or   acetaminophen (TYLENOL) suppository 650 mg  650 mg Rectal Q6H PRN Athena Masse, MD       atorvastatin (LIPITOR) tablet 40 mg  40 mg Oral Daily Judd Gaudier V, MD   40 mg at 05/01/22 0940   cefTRIAXone (ROCEPHIN) 1 g in sodium chloride 0.9 % 100 mL IVPB  1 g Intravenous Q24H Loletha Grayer, MD   Stopped at 04/30/22 1042   clopidogrel (PLAVIX) tablet 75 mg  75 mg Oral Daily Loletha Grayer, MD   75 mg at 05/01/22 0940   cyanocobalamin (VITAMIN B12) tablet 2,500 mcg  2,500 mcg Oral Daily Judd Gaudier V, MD   2,500 mcg at 05/01/22 0940   finasteride (PROSCAR) tablet 5 mg  5 mg Oral Daily Judd Gaudier V, MD   5 mg at 05/01/22 0940   HYDROcodone-acetaminophen (NORCO/VICODIN) 5-325 MG per tablet 1-2 tablet  1-2 tablet Oral Q4H PRN Athena Masse, MD   2 tablet at 05/01/22 0940   losartan (COZAAR) tablet 25 mg  25 mg Oral Daily Loletha Grayer, MD   25 mg at 05/01/22 0946   morphine (PF) 2 MG/ML injection 2 mg  2 mg Intravenous Q2H PRN Athena Masse, MD       ondansetron [facility]) tablet 4 mg  4 mg Oral Q6H PRN Athena Masse, MD       Or   ondansetron [facility]) injection 4 mg  4 mg Intravenous Q6H PRN Athena Masse, MD        polyethylene glycol (MIRALAX / GLYCOLAX) packet 17 g  17 g Oral Daily PRN Loletha Grayer, MD   17 g at 04/30/22 1947   sodium chloride irrigation 0.9 % 3,000 mL  3,000 mL Irrigation Continuous Hinda Kehr, MD   3,000 mL at 04/29/22 0345   spironolactone (ALDACTONE) tablet 12.5 mg  12.5 mg Oral Daily Loletha Grayer, MD   12.5 mg at 05/01/22 0946   tamsulosin (FLOMAX) capsule 0.4 mg  0.4 mg Oral Daily Judd Gaudier V, MD   0.4 mg at 05/01/22 0941   Objective: Vital signs in last 24 hours: Temp:  [98.4 F (36.9 C)-98.6 F (37 C)] 98.4 F (36.9 C) (09/30 0828) Pulse Rate:  [63-77] 65 (09/30 0828) Resp:  [17-18] 18 (09/30 0828) BP: (92-131)/(49-64) 131/63 (09/30 0828) SpO2:  [96 %-97 %] 96 % (09/30 0828)  Intake/Output from previous day: 09/29 0701 - 09/30 0700 In: 100 [IV Piggyback:100] Out: 2500 [Urine:2500] Intake/Output this shift: No intake/output data recorded.  Physical Exam Vitals and nursing note reviewed.  Constitutional:      General: He  is not in acute distress.    Appearance: He is not ill-appearing, toxic-appearing or diaphoretic.  HENT:     Head: Normocephalic and atraumatic.  Pulmonary:     Effort: Pulmonary effort is normal. No respiratory distress.  Skin:    General: Skin is warm and dry.  Neurological:     Mental Status: He is alert and oriented to person, place, and time.  Psychiatric:        Mood and Affect: Mood normal.        Behavior: Behavior normal.   Foley catheter is draining clear yellow urine, off CBI Lab Results:  Recent Labs    04/30/22 0607 05/01/22 0559  WBC 7.5 7.9  HGB 10.3* 10.3*  HCT 30.8* 31.1*  PLT 137* 141*    BMET Recent Labs    04/30/22 0607 05/01/22 0559  NA 136 137  K 3.7 4.0  CL 109 106  CO2 24 26  GLUCOSE 99 100*  BUN 19 17  CREATININE 0.64 0.68  CALCIUM 8.3* 8.5*    PT/INR Recent Labs    04/28/22 2253  LABPROT 14.2  INR 1.1    Studies/Results: No results found.  Assessment &  Plan: 87 year old male with BPH with LUTS on Flomax and finasteride, s/p STEMI in May 2023 on aspirin and Brilinta, and recurrent gross hematuria with benign work-up with Dr. Bernardo Heater earlier this year presents with gross hematuria.  Three-way Foley catheter placed and CBI initiated.  Over the last 24 hours the CBI has been discontinued and his urine has remained clear.  Recommend a voiding trial for this patient.  We will continue with current BPH regimen otherwise.   Ardis Hughs, MD 05/01/2022

## 2022-05-01 NOTE — Progress Notes (Signed)
Rounding Note    Patient Name: [name] Date of Encounter: 05/01/2022  [place] Cardiologist: None   Subjective    Plavix restarted and Hgb 10.3 today. No further overt blood in urine. The patient denies further abdominal pain.   Inpatient Medications    Scheduled Meds:  atorvastatin  40 mg Oral Daily   clopidogrel  75 mg Oral Daily   cyanocobalamin  2,500 mcg Oral Daily   finasteride  5 mg Oral Daily   tamsulosin  0.4 mg Oral Daily   Continuous Infusions:  cefTRIAXone (ROCEPHIN)  IV Stopped (04/30/22 1042)   sodium chloride irrigation     PRN Meds: acetaminophen **OR** acetaminophen, HYDROcodone-acetaminophen, morphine injection, ondansetron **OR** ondansetron (ZOFRAN) IV, polyethylene glycol   Vital Signs    Vitals:   04/29/22 2301 04/30/22 0811 04/30/22 1538 04/30/22 2349  BP: (!) 110/59 109/66 110/64 (!) 92/49  Pulse: 67 61 63 77  Resp: 18   17  Temp: 98.2 F (36.8 C) 98.1 F (36.7 C) 98.6 F (37 C) 98.4 F (36.9 C)  TempSrc: Oral     SpO2: 97% 96% 96% 97%  Weight:      Height:        Intake/Output Summary (Last 24 hours) at 05/01/2022 0732 Last data filed at 05/01/2022 0557 Gross per 24 hour  Intake 100 ml  Output 2500 ml  Net -2400 ml      04/28/2022   10:46 PM 04/28/2022    5:21 PM 03/22/2022    8:36 AM  Last 3 Weights  Weight (lbs) 174 lb 174 lb 3.2 oz 174 lb  Weight (kg) 78.926 kg 79.017 kg 78.926 kg      Telemetry    NSR/SB PACs, HR 50-60s - Personally Reviewed  ECG    No new - Personally Reviewed  Physical Exam   GEN: No acute distress.   Neck: No JVD Cardiac: RRR, no murmurs, rubs, or gallops.  Respiratory: Clear to auscultation bilaterally. GI: Soft, nontender, non-distended  MS: No edema; No deformity. Neuro:  Nonfocal  Psych: Normal affect   Labs    High Sensitivity Troponin:  No results for input(s): "TROPONINIHS" in the last 720 hours.   Chemistry Recent Labs  Lab 04/28/22 2253 04/30/22 0607  05/01/22 0559  NA 138 136 137  K 3.8 3.7 4.0  CL 103 109 106  CO2 '27 24 26  '$ GLUCOSE 146* 99 100*  BUN '16 19 17  '$ CREATININE 0.80 0.64 0.68  CALCIUM 9.2 8.3* 8.5*  PROT 7.3  --   --   ALBUMIN 4.1  --   --   AST 30  --   --   ALT 18  --   --   ALKPHOS 92  --   --   BILITOT 1.0  --   --   GFRNONAA >60 >60 >60  ANIONGAP 8 3* 5    Lipids No results for input(s): "CHOL", "TRIG", "HDL", "LABVLDL", "LDLCALC", "CHOLHDL" in the last 168 hours.  Hematology Recent Labs  Lab 04/28/22 2253 04/29/22 0523 04/29/22 1113 04/30/22 0607 05/01/22 0559  WBC 9.9  10.3  --   --  7.5 7.9  RBC 4.09*  4.19*  --   --  3.28* 3.31*  HGB 12.8*  12.7*   < > 11.5* 10.3* 10.3*  HCT 38.5*  39.4  --   --  30.8* 31.1*  MCV 94.1  94.0  --   --  93.9 94.0  MCH 31.3  30.3  --   --  31.4 31.1  MCHC 33.2  32.2  --   --  33.4 33.1  RDW 13.7  13.7  --   --  14.0 13.7  PLT 200  199  --   --  137* 141*   < > = values in this interval not displayed.   Thyroid No results for input(s): "TSH", "FREET4" in the last 168 hours.  BNPNo results for input(s): "BNP", "PROBNP" in the last 168 hours.  DDimer No results for input(s): "DDIMER" in the last 168 hours.   Radiology    US PELVIS LIMITED (TRANSABDOMINAL ONLY)  Result Date: 04/29/2022 CLINICAL DATA:  Gross hematuria. EXAM: LIMITED ULTRASOUND OF PELVIS TECHNIQUE: Limited transabdominal ultrasound examination of the pelvis was performed. COMPARISON:  04/29/2022 CT abdomen/pelvis. FINDINGS: Initial images demonstrate bladder completely collapsed by indwelling Foley catheter. Subsequent bladder filling demonstrates mild diffuse bladder wall thickening with scattered small bladder diverticula, largest 1.5 cm in the left superior bladder wall. Tiny hypoechoic 0.9 cm focus along the midline posterior bladder wall on the cine sequence. Otherwise no discrete bladder mass or layering debris. IMPRESSION: Well-positioned Foley catheter. Nonspecific mild diffuse bladder wall  thickening with scattered small bladder diverticula, most commonly due to chronic bladder outlet obstruction. Tiny hypoechoic 0.9 cm focus along the midline posterior bladder wall, which could represent adherent blood clot, with tiny bladder wall neoplasm not excluded. Cystoscopic correlation versus follow-up bilateral ultrasound may be considered. Electronically Signed   By: Ilona Sorrel M.D.   On: 04/29/2022 11:20    Cardiac Studies   Limited echo 03/2022  1. Left ventricular ejection fraction, by estimation, is 45 to 50%. The  left ventricle has mildly decreased function. The left ventricle  demonstrates global hypokinesis. Left ventricular diastolic parameters are  consistent with Grade I diastolic  dysfunction (impaired relaxation). The average left ventricular global  longitudinal strain is -15.9 %.   2. Right ventricular systolic function is normal. The right ventricular  size is normal. Tricuspid regurgitation signal is inadequate for assessing  PA pressure.   3. The mitral valve is normal in structure. Mild mitral valve  regurgitation.   4. The aortic valve is normal in structure.   5. The inferior vena cava is normal in size with greater than 50%  respiratory variability, suggesting right atrial pressure of 3 mmHg.   Comparison(s): Previous LVEF reported as 35-40%.    LHC 12/23/2021: Conclusions: Severe single-vessel coronary artery disease with thrombotic 99% stenosis involving the proximal/mid LAD consistent with acute plaque rupture.  There is also a calcified 50-60% stenosis involving the mid LAD at the takeoff of D1. Moderate, nonobstructive coronary artery disease with 50-60% disease involving mid and distal portions of codominant RCA.  No significant disease is identified in the LCx. Successful PCI to proximal/mid LAD using Onyx Frontier 3.5 x 22 mm drug-eluting stent with 0% residual stenosis and TIMI-3 flow. Tortuous right subclavian/innominate artery making catheter  manipulation challenging.  Consider alternative access for future catheterizations.   Recommendations: Dual antiplatelet therapy with aspirin and ticagrelor for at least 12 months. Aggressive secondary prevention of coronary artery disease, including high intensity statin therapy. Obtain echocardiogram to assess LVEF. Start metoprolol succinate 25 mg daily, with judicious escalation as tolerated. Cardiac rehab referral placed. __________   2D echo 12/23/2021: 1. Left ventricular ejection fraction, by estimation, is 35 to 40%. The  left ventricle has moderately decreased function. The left ventricle  demonstrates regional wall motion abnormalities (see scoring  diagram/findings for description). There is mild  left  ventricular hypertrophy. Left ventricular diastolic parameters are  consistent with Grade II diastolic dysfunction (pseudonormalization).  There is akinesis of the left ventricular, mid-apical anteroseptal wall  and apical segment. The average left  ventricular global longitudinal strain is -10.4 %. The global longitudinal  strain is abnormal.   2. Right ventricular systolic function is low normal. The right  ventricular size is normal.   3. The mitral valve is normal in structure. Mild mitral valve  regurgitation.   4. The aortic valve is tricuspid. Aortic valve regurgitation is not  visualized.  Patient Profile     87 y.o. male with a hx of HTN, DVT, BPH, chronic HFrEF, ICM LVEF 45-50%, and STEMI s/p DES LAD in May 2023 who is being seen 04/29/2022 for the evaluation of hematuria.  Assessment & Plan    CAD s/p DES LAD 12/23/21 - patient presented with gross hematuria. Hgb 12.8, down from 14 - ASA and Brilinta held - patient treated with CBI per urology - monotherapy with Plavix restarted 04/30/22 - Hgb stable today at 10.3 with no further bleeding - no chest pain reported - continue to trend CBC   Gross hematuria Anemia - patient was following with urology for  microscopic hematuria PTA  - Hgb 12.8 on arrival. It was 14 in 12/2021. He did not require blood transfusion - CT showed 8cm hyperdense collection within the bladder lumen, blood clot arising from the prostate gland, foley was placed, underwent CBI - Urology consulted - US showed <1cm residual blood clot in the bladder, continue to watch and wait - Hgb stable at 10.3 - no overt blood noted in urine today   ICM HFrEF - repeat limited echo 03/2022 showed LVEF 45-50%, G1DD - He appears euvolemic on exam - continue PTA Losartan '25mg'$  daily and spironolactone '25mg'$  daily.  - No BB with bradycardia   Hypotension - BP improved with IVF    For questions or updates, please contact [place] Please consult [email or web address] for contact info under        Signed, Sante Biedermann Ninfa Meeker, PA-C  05/01/2022, 7:32 AM

## 2022-05-01 NOTE — Progress Notes (Signed)
  Progress Note   Patient: [name] FXT:[id number] DOB: Apr 28, 1935 DOA: 04/28/2022     2 DOS: the patient was seen and examined on 05/01/2022    Assessment and Plan: * Gross hematuria Patient with bladder outlet obstruction likely secondary to blood, BPH and hydronephrosis.  Appreciate urology irrigating bladder on 04/29/2022.  Urine this morning was dark yellow.  Urology was okay with removing the Foley and doing a voiding trial.  Patient was able to urinate once well without any blood but then only urinating 100 mL at a time and did have a small blood clot.  Been retaining urine of 700 mL.  Urology recommended intermittent catheterization x2 for greater than 500 mL NS for third time more than 500 then may end up needing a Foley again.  No urine culture sent off after Foley catheter was removed.  Empiric Rocephin.  Postural dizziness with presyncope Ambulate today    HFrEF secondary to ischemic cardiomyopathy (heart failure with reduced ejection fraction) (HCC) Clinically compensated Continue spironolactone, losartan Last EF 8/4 45 to 50%   History of ST elevation myocardial infarction with stent to LAD 11/2021 (STEMI) Restarted Plavix. Continue atorvastatin.  Not on beta-blocker due to intolerance   HTN (hypertension) Continue low-dose Aldactone and losartan        Subjective: Patient seen this morning was feeling good.  Seen after Foley taken out and he was having trouble urinating only 100 cc at a time and then retaining 700 cc.  Case discussed with urology and recommended in and out catheter x2 times  Physical Exam: Vitals:   04/30/22 0811 04/30/22 1538 04/30/22 2349 05/01/22 0828  BP: 109/66 110/64 (!) 92/49 131/63  Pulse: 61 63 77 65  Resp:   17 18  Temp: 98.1 F (36.7 C) 98.6 F (37 C) 98.4 F (36.9 C) 98.4 F (36.9 C)  TempSrc:      SpO2: 96% 96% 97% 96%  Weight:      Height:       Physical Exam HENT:     Head: Normocephalic.     Mouth/Throat:      Pharynx: No oropharyngeal exudate.  Eyes:     General: Lids are normal.     Conjunctiva/sclera: Conjunctivae normal.  Cardiovascular:     Rate and Rhythm: Normal rate and regular rhythm.     Heart sounds: Normal heart sounds, S1 normal and S2 normal.  Pulmonary:     Breath sounds: No decreased breath sounds, wheezing, rhonchi or rales.  Abdominal:     Palpations: Abdomen is soft.     Tenderness: There is no abdominal tenderness.  Genitourinary:    Comments: Urine this morning was yellow in Foley.  This afternoon did have 1 small blood clot. Musculoskeletal:     Right lower leg: No swelling.     Left lower leg: No swelling.  Skin:    General: Skin is warm.     Findings: No rash.  Neurological:     Mental Status: He is alert and oriented to person, place, and time.     Data Reviewed: Creatinine 0.68, hemoglobin 10.3  Family Communication: Spoke with wife at the bedside  Disposition: Status is: Inpatient Remains inpatient appropriate because: Having trouble urinating after Foley catheter removed.  Planned Discharge Destination: Home    Time spent: 28 minutes  Author: Loletha Grayer, MD 05/01/2022 4:22 PM  For on call review [email or web address].

## 2022-05-02 DIAGNOSIS — I25118 Atherosclerotic heart disease of native coronary artery with other forms of angina pectoris: Secondary | ICD-10-CM | POA: Diagnosis not present

## 2022-05-02 DIAGNOSIS — R42 Dizziness and giddiness: Secondary | ICD-10-CM | POA: Diagnosis not present

## 2022-05-02 DIAGNOSIS — R310 Gross hematuria: Secondary | ICD-10-CM

## 2022-05-02 DIAGNOSIS — I5020 Unspecified systolic (congestive) heart failure: Secondary | ICD-10-CM | POA: Diagnosis not present

## 2022-05-02 LAB — CBC
HCT: 32.2 % — ABNORMAL LOW (ref 39.0–52.0)
Hemoglobin: 10.8 g/dL — ABNORMAL LOW (ref 13.0–17.0)
MCH: 31.8 pg (ref 26.0–34.0)
MCHC: 33.5 g/dL (ref 30.0–36.0)
MCV: 94.7 fL (ref 80.0–100.0)
Platelets: 157 10*3/uL (ref 150–400)
RBC: 3.4 MIL/uL — ABNORMAL LOW (ref 4.22–5.81)
RDW: 13.4 % (ref 11.5–15.5)
WBC: 7 10*3/uL (ref 4.0–10.5)
nRBC: 0 % (ref 0.0–0.2)

## 2022-05-02 LAB — BASIC METABOLIC PANEL
Anion gap: 5 (ref 5–15)
BUN: 19 mg/dL (ref 8–23)
CO2: 26 mmol/L (ref 22–32)
Calcium: 9 mg/dL (ref 8.9–10.3)
Chloride: 107 mmol/L (ref 98–111)
Creatinine, Ser: 0.69 mg/dL (ref 0.61–1.24)
GFR, Estimated: >60 mL/min (ref >60)
Glucose, Bld: 104 mg/dL — ABNORMAL HIGH (ref 70–99)
Potassium: 3.9 mmol/L (ref 3.5–5.1)
Sodium: 138 mmol/L (ref 135–145)

## 2022-05-02 MED ORDER — CLOPIDOGREL BISULFATE 75 MG PO TABS: 75.0000 mg | ORAL_TABLET | Freq: Every day | ORAL | 0 refills | Status: DC

## 2022-05-02 MED ORDER — CEPHALEXIN 500 MG PO CAPS: 500.0000 mg | ORAL_CAPSULE | Freq: Three times a day (TID) | ORAL | 0 refills | Status: DC

## 2022-05-02 MED ORDER — POLYETHYLENE GLYCOL 3350 17 G PO PACK: 17.0000 g | PACK | Freq: Every day | ORAL | 0 refills | Status: AC | PRN

## 2022-05-02 NOTE — Progress Notes (Signed)
Urology Inpatient Progress Note  Subjective: Foley removed yesterday, unable to void.  Cathed twice overnight and in the afternoon for about 550cc each time.  Has not voided yet this aM. Patient has no complaints today.  Anti-infectives: Anti-infectives (From admission, onward)    Start     Dose/Rate Route Frequency Ordered Stop   05/01/22 0000  cephALEXin (KEFLEX) 500 MG capsule        500 mg Oral 3 times daily 05/01/22 0947 05/04/22 2359   04/30/22 1000  cefTRIAXone (ROCEPHIN) 1 g in sodium chloride 0.9 % 100 mL IVPB        1 g 200 mL/hr over 30 Minutes Intravenous Every 24 hours 04/30/22 0902        Current Facility-Administered Medications  Medication Dose Route Frequency Provider Last Rate Last Admin   acetaminophen (TYLENOL) tablet 650 mg  650 mg Oral Q6H PRN Athena Masse, MD       Or   acetaminophen (TYLENOL) suppository 650 mg  650 mg Rectal Q6H PRN Athena Masse, MD       atorvastatin (LIPITOR) tablet 40 mg  40 mg Oral Daily Judd Gaudier V, MD   40 mg at 05/01/22 0940   cefTRIAXone (ROCEPHIN) 1 g in sodium chloride 0.9 % 100 mL IVPB  1 g Intravenous Q24H Loletha Grayer, MD   Stopped at 04/30/22 1042   clopidogrel (PLAVIX) tablet 75 mg  75 mg Oral Daily Loletha Grayer, MD   75 mg at 05/01/22 0940   cyanocobalamin (VITAMIN B12) tablet 2,500 mcg  2,500 mcg Oral Daily Judd Gaudier V, MD   2,500 mcg at 05/01/22 0940   finasteride (PROSCAR) tablet 5 mg  5 mg Oral Daily Judd Gaudier V, MD   5 mg at 05/01/22 0940   HYDROcodone-acetaminophen (NORCO/VICODIN) 5-325 MG per tablet 1-2 tablet  1-2 tablet Oral Q4H PRN Athena Masse, MD   2 tablet at 05/01/22 0940   losartan (COZAAR) tablet 25 mg  25 mg Oral Daily Loletha Grayer, MD   25 mg at 05/01/22 0946   morphine (PF) 2 MG/ML injection 2 mg  2 mg Intravenous Q2H PRN Athena Masse, MD       ondansetron [facility]) tablet 4 mg  4 mg Oral Q6H PRN Athena Masse, MD       Or   ondansetron [facility]) injection 4 mg  4 mg  Intravenous Q6H PRN Athena Masse, MD       polyethylene glycol (MIRALAX / GLYCOLAX) packet 17 g  17 g Oral Daily PRN Loletha Grayer, MD   17 g at 04/30/22 1947   spironolactone (ALDACTONE) tablet 12.5 mg  12.5 mg Oral Daily Loletha Grayer, MD   12.5 mg at 05/01/22 0946   tamsulosin (FLOMAX) capsule 0.4 mg  0.4 mg Oral Daily Judd Gaudier V, MD   0.4 mg at 05/01/22 0941   Objective: Vital signs in last 24 hours: Temp:  [98.4 F (36.9 C)-99.6 F (37.6 C)] 98.4 F (36.9 C) (10/01 0915) Pulse Rate:  [42-86] 86 (10/01 0915) Resp:  [16-18] 16 (10/01 0915) BP: (104-141)/(58-84) 141/84 (10/01 0915) SpO2:  [94 %-100 %] 100 % (10/01 0915)  Intake/Output from previous day: 09/30 0701 - 10/01 0700 In: 480 [P.O.:480] Out: 1950 [[id number]] Intake/Output this shift: No intake/output data recorded.  Physical Exam Vitals and nursing note reviewed.  Constitutional:      General: He is not in acute distress.    Appearance: He is not ill-appearing, toxic-appearing or  diaphoretic.  HENT:     Head: Normocephalic and atraumatic.  Pulmonary:     Effort: Pulmonary effort is normal. No respiratory distress.  Skin:    General: Skin is warm and dry.  Neurological:     Mental Status: He is alert and oriented to person, place, and time.  Psychiatric:        Mood and Affect: Mood normal.        Behavior: Behavior normal.   Foley catheter is draining clear yellow urine, off CBI Lab Results:  Recent Labs    05/01/22 0559 05/02/22 0452  WBC 7.9 7.0  HGB 10.3* 10.8*  HCT 31.1* 32.2*  PLT 141* 157    BMET Recent Labs    05/01/22 0559 05/02/22 0452  NA 137 138  K 4.0 3.9  CL 106 107  CO2 26 26  GLUCOSE 100* 104*  BUN 17 19  CREATININE 0.68 0.69  CALCIUM 8.5* 9.0    PT/INR No results for input(s): "LABPROT", "INR" in the last 72 hours.  Studies/Results: No results found.  Assessment & Plan: 87 year old male with BPH with LUTS on Flomax and finasteride, s/p STEMI in May  2023 on aspirin and Brilinta, and recurrent gross hematuria with benign work-up with Dr. Bernardo Heater earlier this year presents with gross hematuria.   Fortunately the hematuria cleared.  Unfortunately he has been able to void.  If he is not able to void this morning after breakfast and ambulating I recommended placing a Foley catheter and discharging him home.  He can follow-up with [facility] for a voiding trial later in the week.  Ardis Hughs, MD 05/02/2022

## 2022-05-02 NOTE — Plan of Care (Signed)
Patient discharged per MD orders at this time.All discharge instructions, education and medications reviewed with the patient and family.Patient  expressed understanding and will comply with dc instructions.follow up appointments was also communicated to the patient.no verbal c/o or any ssx of distress.Pt was discharged home with HH/PT/OT services per order.Pt was transported home by wife in a privately owned vehicle.

## 2022-05-02 NOTE — Progress Notes (Signed)
Indwelling catheter inserted at 1208. 800 cc amber colored urine returned.

## 2022-05-02 NOTE — Plan of Care (Signed)

## 2022-05-02 NOTE — Discharge Summary (Signed)
Physician Discharge Summary   Patient: [name] MRN: [id number] DOB: 07-03-1935  Admit date:     04/28/2022  Discharge date: 05/02/22  Discharge Physician: Loletha Grayer   PCP: Venia Carbon, MD   Recommendations at discharge:   Follow-up PCP 5 days Follow-up urology 8 days  Discharge Diagnoses: Principal Problem:   Gross hematuria Active Problems:   Postural dizziness with presyncope   HTN (hypertension)   Coronary artery disease of native artery of native heart with stable angina pectoris ([place])   History of ST elevation myocardial infarction with stent to LAD 11/2021 (STEMI)   HFrEF secondary to ischemic cardiomyopathy (heart failure with reduced ejection fraction) [facility])    Hospital Course: The patient was admitted to the hospital on 04/29/2022 and discharged on 05/02/2022.  Came in with gross hematuria.  The patient's Brilinta and aspirin were held.  CT scan showed an 8 cm hyperdense collection within the bladder lumen dependently consistent with a blood clot that appears to arise from the right lobe of the prostate gland resulting in bladder outlet obstruction with moderate bilateral distention and mild bilateral hydronephrosis.  A Foley catheter was placed in the ER.  Urology saw the patient and placed a daily catheter and was able to irrigate the bladder and remove blood clots.  The patient was put on continuous bladder irrigation.  The next morning the urine was clear.  Clearance by urology and cardiology to restart Plavix alone.  Neurology recommended empiric antibiotics.  On 9/30 we tried a voiding trial, the initial urination was good but then had frequent low-volume urinations and was retaining urine.  Urine culture was sent after Foley catheter was taken out.  Needed to straight catheter overnight.  On 05/02/2022 the patient had a Foley catheter placed by nursing staff secondary to greater than 672m in the bladder.  Patient was discharged home with Foley catheter.   Follow-up with urology for voiding trial.  Hemoglobin on admission 12.8.  Hemoglobin upon discharge 10.8.  Assessment and Plan: * Gross hematuria Patient with bladder outlet obstruction likely secondary to blood, BPH and hydronephrosis.  Appreciate urology irrigating bladder on 04/29/2022.  Urine this morning was dark yellow.  Urology was okay with removing the Foley and doing a voiding trial.  Patient was able to urinate once well without any blood but then only urinating 100 mL at a time and did have a small blood clot.  Been retaining urine of 700 mL.  Urology recommended intermittent catheterization x2 for greater than 500 mL.  Unfortunately the patient had required a Foley catheter prior to going home for greater than 600 mL in the bladder.  Will need to follow-up with urology as outpatient.  Empiric Keflex prescribed upon discharge.  Second urine culture still pending.  Postural dizziness with presyncope Initially secondary to blood loss.  This has resolved.    HFrEF secondary to ischemic cardiomyopathy (heart failure with reduced ejection fraction) (HCC) Clinically compensated Continue spironolactone, losartan Last EF 8/4 45 to 50%   History of ST elevation myocardial infarction with stent to LAD 11/2021 (STEMI) Restarted Plavix alone.  Continue to hold aspirin. Continue atorvastatin.  Not on beta-blocker due to intolerance   HTN (hypertension) Continue low-dose Aldactone and losartan         Consultants: Urology, cardiology Procedures performed: Continuous bladder irrigation, Foley catheter placement Disposition: Home health Diet recommendation:  Cardiac diet DISCHARGE MEDICATION: Allergies as of 05/02/2022   No Known Allergies      Medication List  STOP taking these medications    aspirin 81 MG chewable tablet   ciprofloxacin 250 MG tablet Commonly known as: Cipro   ticagrelor 90 MG Tabs tablet Commonly known as: Brilinta       TAKE these medications     atorvastatin 40 MG tablet Commonly known as: LIPITOR Take 1 tablet (40 mg total) by mouth daily.   B-12 2500 MCG Tabs Take 1 tablet by mouth daily.   CENTRUM PO Take 1 tablet by mouth daily.   cephALEXin 500 MG capsule Commonly known as: KEFLEX Take 1 capsule (500 mg total) by mouth 3 (three) times daily for 5 days. Start taking on: May 03, 2022   clopidogrel 75 MG tablet Commonly known as: PLAVIX Take 1 tablet (75 mg total) by mouth daily.   finasteride 5 MG tablet Commonly known as: PROSCAR TAKE 1 TABLET BY MOUTH DAILY. GENERIC EQUIVALENT FOR PROSCAR   ketoconazole 2 % cream Commonly known as: NIZORAL Apply 1 Application topically 2 (two) times daily as needed for irritation.   losartan 25 MG tablet Commonly known as: COZAAR Take 1 tablet (25 mg total) by mouth daily.   polyethylene glycol 17 g packet Commonly known as: MIRALAX / GLYCOLAX Take 17 g by mouth daily as needed for moderate constipation.   spironolactone 25 MG tablet Commonly known as: ALDACTONE TAKE ONE HALF TABLET (12.'5MG'$ ) BY MOUTH DAILY   tamsulosin 0.4 MG Caps capsule Commonly known as: FLOMAX Take 1 capsule (0.4 mg total) by mouth daily.        Follow-up Information     [name] I, MD Follow up in 5 day(s).   Specialties: Internal Medicine, Pediatrics Contact information: [place] [address] [phone]         Abbie Sons, MD Follow up in 8 day(s).   Specialty: Urology Contact information: [place] [address] [phone]                Discharge Exam: Danley Danker Weights   04/28/22 2246  Weight: 78.9 kg   Physical Exam HENT:     Head: Normocephalic.     Mouth/Throat:     Pharynx: No oropharyngeal exudate.  Eyes:     General: Lids are normal.     Conjunctiva/sclera: Conjunctivae normal.  Cardiovascular:     Rate and Rhythm: Normal rate and regular rhythm.     Heart sounds: Normal heart  sounds, S1 normal and S2 normal.  Pulmonary:     Breath sounds: No decreased breath sounds, wheezing, rhonchi or rales.  Abdominal:     Palpations: Abdomen is soft.     Tenderness: There is no abdominal tenderness.  Musculoskeletal:     Right lower leg: No swelling.     Left lower leg: No swelling.  Skin:    General: Skin is warm.     Findings: No rash.  Neurological:     Mental Status: He is alert and oriented to person, place, and time.      Condition at discharge: stable  The results of significant diagnostics from this hospitalization (including imaging, microbiology, ancillary and laboratory) are listed below for reference.   Imaging Studies: US PELVIS LIMITED (TRANSABDOMINAL ONLY)  Result Date: 04/29/2022 CLINICAL DATA:  Gross hematuria. EXAM: LIMITED ULTRASOUND OF PELVIS TECHNIQUE: Limited transabdominal ultrasound examination of the pelvis was performed. COMPARISON:  04/29/2022 CT abdomen/pelvis. FINDINGS: Initial images demonstrate bladder completely collapsed by indwelling Foley catheter. Subsequent bladder filling demonstrates mild  diffuse bladder wall thickening with scattered small bladder diverticula, largest 1.5 cm in the left superior bladder wall. Tiny hypoechoic 0.9 cm focus along the midline posterior bladder wall on the cine sequence. Otherwise no discrete bladder mass or layering debris. IMPRESSION: Well-positioned Foley catheter. Nonspecific mild diffuse bladder wall thickening with scattered small bladder diverticula, most commonly due to chronic bladder outlet obstruction. Tiny hypoechoic 0.9 cm focus along the midline posterior bladder wall, which could represent adherent blood clot, with tiny bladder wall neoplasm not excluded. Cystoscopic correlation versus follow-up bilateral ultrasound may be considered. Electronically Signed   By: Ilona Sorrel M.D.   On: 04/29/2022 11:20   CT HEMATURIA WORKUP  Result Date: 04/29/2022 CLINICAL DATA:  Hematuria EXAM: CT ABDOMEN  AND PELVIS WITHOUT AND WITH CONTRAST TECHNIQUE: Multidetector CT imaging of the abdomen and pelvis was performed following the standard protocol before and following the bolus administration of intravenous contrast. RADIATION DOSE REDUCTION: This exam was performed according to the departmental dose-optimization program which includes automated exposure control, adjustment of the mA and/or kV according to patient size and/or use of iterative reconstruction technique. CONTRAST:  116m OMNIPAQUE IOHEXOL 300 MG/ML  SOLN COMPARISON:  03/05/2022 FINDINGS: Lower chest: Mild bibasilar atelectasis. No significant coronary artery calcification. Cardiac size is within normal limits. Small hiatal hernia. Hepatobiliary: No focal liver abnormality is seen. No gallstones, gallbladder wall thickening, or biliary dilatation. Pancreas: Hypoenhancing focus within the uncinate process of the pancreas at axial image # 32/4 appears stable since remote prior examination of 12/09/2006 and likely represents focal fatty atrophy/infiltration. The pancreas is otherwise unremarkable. Spleen: Unremarkable Adrenals/Urinary Tract: The adrenal glands are unremarkable. The kidneys are normal in size and position. 2 mm nonobstructing calculi are noted within the lower pole of the left kidney. No ureteral calculi. Simple cortical cyst is seen within the interpolar region of the left kidney. No follow-up imaging is recommended for this lesion. There is mild bilateral hydronephrosis. The bladder is distended. There is a 8 cm hyperdense collection within the bladder lumen dependently most in keeping with a blood clot which appears to arise from the a right lobe of the prostate gland. Small bladder diverticula again noted suggesting changes of chronic bladder outlet obstruction. Stomach/Bowel: Stomach is within normal limits. Appendix appears normal. No evidence of bowel wall thickening, distention, or inflammatory changes. Vascular/Lymphatic: Aortic  atherosclerosis. No enlarged abdominal or pelvic lymph nodes. Reproductive: The prostate gland is moderately enlarged. Other: Small fat containing right inguinal hernia. Musculoskeletal: Degenerative changes are seen within the lumbar spine and hips bilaterally. No acute bone abnormality. IMPRESSION: 1. 8 cm hyperdense collection within the bladder lumen dependently most in keeping with a blood clot which appears to arise from the right lobe of the prostate gland. This results in bladder outlet obstruction with moderate bladder distension and mild bilateral hydronephrosis. 2. Minimal left nonobstructing nephrolithiasis. No ureteral calculi. 3. Moderate prostatic enlargement with evidence of superimposed chronic bladder outlet obstruction. Aortic Atherosclerosis (ICD10-I70.0). Electronically Signed   By: AFidela SalisburyM.D.   On: 04/29/2022 02:14    Microbiology: Results for orders placed or performed during the hospital encounter of 04/28/22  Urine Culture     Status: None   Collection Time: 04/28/22 10:53 PM   Specimen: Urine, Clean Catch  Result Value Ref Range Status   Specimen Description   Final    URINE, CLEAN CATCH [facility] [address] [place] [address]   Special Requests   Final  NONE Performed at [facility], [address], [place], [place] [address]    Culture   Final    NO GROWTH Performed at [facility], [place] [address] [place], [place] [address]    Report Status 04/30/2022 FINAL  Final    Labs: CBC: Recent Labs  Lab 04/28/22 2253 04/29/22 0523 04/29/22 1113 04/30/22 0607 05/01/22 0559 05/02/22 0452  WBC 9.9  10.3  --   --  7.5 7.9 7.0  NEUTROABS 8.2*  --   --   --   --   --   HGB 12.8*  12.7* 11.3* 11.5* 10.3* 10.3* 10.8*  HCT 38.5*  39.4  --   --  30.8* 31.1* 32.2*  MCV 94.1  94.0  --   --  93.9 94.0 94.7  PLT 200  199  --   --  137* 141* 545   Basic Metabolic Panel: Recent Labs  Lab  04/28/22 2253 04/30/22 0607 05/01/22 0559 05/02/22 0452  NA 138 136 137 138  K 3.8 3.7 4.0 3.9  CL 103 109 106 107  CO2 '27 24 26 26  '$ GLUCOSE 146* 99 100* 104*  BUN '16 19 17 19  '$ CREATININE 0.80 0.64 0.68 0.69  CALCIUM 9.2 8.3* 8.5* 9.0   Liver Function Tests: Recent Labs  Lab 04/28/22 2253  AST 30  ALT 18  ALKPHOS 92  BILITOT 1.0  PROT 7.3  ALBUMIN 4.1   CBG: No results for input(s): "GLUCAP" in the last 168 hours.  Discharge time spent: greater than 30 minutes.  Signed: Loletha Grayer, MD [facility] 05/02/2022

## 2022-05-02 NOTE — TOC Transition Note (Signed)
Transition of Care [facility]) - CM/SW Discharge Note   Patient Details  Name: [name] MRN: [id number] Date of Birth: [birth year]  [facility] CM/SW Contact:  Izola Price, RN Phone Number: 05/02/2022, 12:18 PM   Clinical Narrative:  10/1: Patient is discharging to home with [facility] orders for PT/OT. Patient wanted RN CM to speak to spouse. They had no preference for [facility] agency. Was set up with [facility] via [place]. Patient currently goes to cardiac rehab. PCP and RX documented. No DME ordered. Simmie Davies RN CM     Final next level of care: Home w Home Health Services Barriers to Discharge: Barriers Resolved   Patient Goals and CMS Choice        Discharge Placement                       Discharge Plan and Services   Discharge Planning Services: CM Consult                      HH Arranged: PT, OT [facility] Agency: [place] Date [facility] Agency Contacted: 05/02/22 Time [place]: 1218 Representative spoke with at [place]: [place]yrl [place].  Social Determinants of Health (SDOH) Interventions     Readmission Risk Interventions     No data to display

## 2022-05-03 ENCOUNTER — Telehealth: Payer: Self-pay | Admitting: *Deleted

## 2022-05-03 ENCOUNTER — Telehealth: Payer: Self-pay

## 2022-05-03 LAB — URINE CULTURE
Culture: <10000 — AB
Special Requests: NORMAL

## 2022-05-03 NOTE — Telephone Encounter (Signed)
Spoke to pt's wife. She said he was doing ok. Has a catheter in right now. Requires a late day appt. Only thing that worked for them was 05-17-22. He is scheduled to see urology on 05-06-22.

## 2022-05-03 NOTE — Telephone Encounter (Signed)
Follow-up PCP 5 days Follow-up urology 8 days  Venia Carbon, MD  Pilar Grammes, CMA Please check on him and make sure he has a follow up with urology (and with me)        Previous Messages    ----- Message -----  From: Loletha Grayer, MD  Sent: 05/02/2022   4:08 PM EDT  To: Venia Carbon, MD

## 2022-05-03 NOTE — Patient Outreach (Signed)
  Care Coordination [facility] Note Transition Care Management Unsuccessful Follow-up Telephone Call  Date of discharge and from where:  [facility] [phone]  Attempts:  1st Attempt  Reason for unsuccessful TCM follow-up call:  Left voice message   [place] Management [phone]

## 2022-05-04 ENCOUNTER — Telehealth: Payer: Self-pay | Admitting: *Deleted

## 2022-05-04 NOTE — Patient Outreach (Signed)
  Care Coordination TOC Note Transition Care Management Follow-up Telephone Call Date of discharge and from where: [facility] [phone] How have you been since you were released from the hospital? Doing real good if it wasn't for the catheter Any questions or concerns? Yes Wanted to know could he drive. RN instructed not to drive until he sees Dr Items Reviewed: Did the pt receive and understand the discharge instructions provided? Yes  Medications obtained and verified? Yes  Other? No  Any new allergies since your discharge? No  Dietary orders reviewed? No Do you have support at home? Yes   Home Care and Equipment/Supplies: Were home health services ordered? yes If so, what is the name of the agency? [facility]  Has the agency set up a time to come to the patient's home? Patient declined Were any new equipment or medical supplies ordered?  No What is the name of the medical supply agency? N/a Were you able to get the supplies/equipment? not applicable Do you have any questions related to the use of the equipment or supplies? No  Functional Questionnaire: (I = Independent and D = Dependent) ADLs: I  Bathing/Dressing- I  Meal Prep- I  Eating- I  Maintaining continence- I  Transferring/Ambulation- I  Managing Meds- I  Follow up appointments reviewed:  PCP Hospital f/u appt confirmed? Y  Scheduled to see Dr Silvio Pate [phone] 4:00  [place] [facility] f/u appt confirmed? Yes  Scheduled to see Debroah Loop on [phone] 9 am and 1 pm  Are transportation arrangements needed? No  If their condition worsens, is the pt aware to call PCP or go to the Emergency Dept.? Yes Was the patient provided with contact information for the PCP's office or ED? Yes Was to pt encouraged to call back with questions or concerns? Yes  SDOH assessments and interventions completed:   Yes  Care Coordination Interventions Activated:  Yes   Care Coordination Interventions:  No Care Coordination  interventions needed at this time.   Encounter Outcome:  Pt. Visit Completed    [place] Management [phone]

## 2022-05-04 NOTE — Patient Outreach (Signed)
  Care Coordination [facility] Note Transition Care Management Unsuccessful Follow-up Telephone Call  Date of discharge and from where:  [facility] [phone]  Attempts:  2nd Attempt  Reason for unsuccessful TCM follow-up call:  Left voice message  [place] Care Management [phone]

## 2022-05-05 ENCOUNTER — Encounter: Payer: Self-pay | Admitting: *Deleted

## 2022-05-05 DIAGNOSIS — I213 ST elevation (STEMI) myocardial infarction of unspecified site: Secondary | ICD-10-CM

## 2022-05-05 DIAGNOSIS — Z955 Presence of coronary angioplasty implant and graft: Secondary | ICD-10-CM

## 2022-05-05 NOTE — Progress Notes (Signed)
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 Anxiety, and Depression - Group verbal and visual presentation to define topics covered.  Reviews how body is impacted by stress, anxiety, and depression.  Also discusses healthy ways to reduce stress and to treat/manage anxiety and depression.  Written material given at graduation.   Education: Sleep Hygiene -Provides group verbal and written instruction about how sleep can affect your health.  Define sleep hygiene, discuss sleep cycles and impact of sleep habits. Review good sleep hygiene tips.    Initial Review & Psychosocial Screening:  Initial Psych Review & Screening - 02/01/22 1343  Initial Review   Current issues with None Identified      Family Dynamics   Good Support System? Yes   wife,Berniece<     Barriers   Psychosocial barriers to participate in program There are no identifiable  barriers or psychosocial needs.      Screening Interventions   Interventions Encouraged to exercise;To provide support and resources with identified psychosocial needs;Provide feedback about the scores to participant    Expected Outcomes Short Term goal: Utilizing psychosocial counselor, staff and physician to assist with identification of specific Stressors or current issues interfering with healing process. Setting desired goal for each stressor or current issue identified.;Long Term Goal: Stressors or current issues are controlled or eliminated.;Short Term goal: Identification and review with participant of any Quality of Life or Depression concerns found by scoring the questionnaire.;Long Term goal: The participant improves quality of Life and PHQ9 Scores as seen by post scores and/or verbalization of changes             Quality of Life Scores:   Quality of Life - 03/29/22 1540       Quality of Life   Select --      Quality of Life Scores   Health/Function Pre --    Health/Function Post --    Health/Function % Change --    Socioeconomic Pre --    Socioeconomic Post --    Socioeconomic % Change  --    Psych/Spiritual Pre --    Psych/Spiritual Post --    Psych/Spiritual % Change --    Family Pre --    Family Post --    Family % Change --    GLOBAL Pre --    GLOBAL Post --    GLOBAL % Change --            Scores of 19 and below usually indicate a poorer quality of life in these areas.  A difference of  2-3 points is a clinically meaningful difference.  A difference of 2-3 points in the total score of the Quality of Life Index has been associated with significant improvement in overall quality of life, self-image, physical symptoms, and general health in studies assessing change in quality of life.  PHQ-9: Review Flowsheet  More data exists      02/15/2022 11/10/2021 10/31/2020 09/12/2018 09/06/2017  Depression screen PHQ 2/9  Decreased Interest 0 0 0 0 0  Down, Depressed,  Hopeless 0 0 0 0 0  PHQ - 2 Score 0 0 0 0 0  Altered sleeping 0 - - - -  Tired, decreased energy 3 - - - -  Change in appetite 0 - - - -  Feeling bad or failure about yourself  0 - - - -  Trouble concentrating 0 - - - -  Moving slowly or fidgety/restless 0 - - - -  Suicidal thoughts 0 - - - -  PHQ-9 Score 3 - - - -  Difficult doing work/chores Not difficult at all - - - -   Interpretation of Total Score  Total Score Depression Severity:  1-4 = Minimal depression, 5-9 = Mild depression, 10-14 = Moderate depression, 15-19 = Moderately severe depression, 20-27 = Severe depression   Psychosocial Evaluation and Intervention:  Psychosocial Evaluation - 02/01/22 1351       Psychosocial Evaluation & Interventions   Interventions Encouraged to exercise with the program and follow exercise prescription    Comments Emiliano has no barriers to attending the program. 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 His wife is his support. He stated no concerns with stres, depression nor anxiety.    Expected Outcomes STG Jameson is able to attend all scheduled sessions, he is able to progress his exercise and learn how to be heart healthy. LTG Zyquan continues to progress and utilize all he learned after discharge    Continue Psychosocial Services  Follow up required by staff             Psychosocial Re-Evaluation:  Psychosocial Re-Evaluation     Cherry Log Name 03/15/22 1602 04/28/22 1727           Psychosocial Re-Evaluation   Current issues with None Identified None Identified      Comments Khamron reports no new stress or sleep concerns. 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 He did state that is a hard thing to do. Edmar denied any big concerns with mental health or sleep. 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 He  is keeping 3 of them on top of his bull and chickens. He enjoys his wife who is good support for hin. He is getting ready to graduate in the next couple of weeks and has really enjoyed the program.      Expected Outcomes Short: continue to attend cardiac rehab consistenly for mental health benefits. Long: mantain positive mental health habits. Short: Graduate Long: Graduate from [employer] and continue to maintain positive attitude      Interventions -- Encouraged to attend Cardiac Rehabilitation for the exercise      Continue Psychosocial Services  Follow up required by staff Follow up required by staff               Psychosocial Discharge (Final Psychosocial Re-Evaluation):  Psychosocial Re-Evaluation - 04/28/22 1727       Psychosocial Re-Evaluation   Current issues with None Identified    Comments Noel denied any big concerns with mental health or sleep. 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 He is keeping 3 of them on top of his bull and chickens. He enjoys his wife who is good support for hin. He is getting ready to graduate in the next couple of weeks and has really enjoyed the program.    Expected Outcomes Short: Graduate Long: Graduate from [facility] and continue to maintain positive attitude    Interventions Encouraged to attend Cardiac Rehabilitation for the exercise    Continue Psychosocial Services  Follow up required by staff             Vocational Rehabilitation: Provide vocational rehab assistance to qualifying candidates.   Vocational Rehab Evaluation & Intervention:   Education: Education Goals: Education classes will be provided on a variety of topics geared toward better understanding of heart health and risk factor modification. Participant will state understanding/return demonstration of topics presented as noted by education test scores.  Learning  Barriers/Preferences:   General Cardiac Education Topics:  AED/CPR: - Group verbal and written instruction with the use of models to demonstrate the basic use of the AED with the basic ABC's of resuscitation.   Anatomy and Cardiac Procedures: - Group verbal and visual presentation  and models provide information about basic cardiac anatomy and function. Reviews the testing methods done to diagnose heart disease and the outcomes of the test results. Describes the treatment choices: Medical Management, Angioplasty, or Coronary Bypass Surgery for treating various heart conditions including Myocardial Infarction, Angina, Valve Disease, and Cardiac Arrhythmias.  Written material given at graduation.   Medication Safety: - Group verbal and visual instruction to review commonly prescribed medications for heart and lung disease. Reviews the medication, class of the drug, and side effects. Includes the steps to properly store meds and maintain the prescription regimen.  Written material given at graduation.   Intimacy: - Group verbal instruction through game format to discuss how heart and lung disease can affect sexual intimacy. Written material given at graduation..   Know Your Numbers and Heart Failure: - Group verbal and visual instruction to discuss disease risk factors for cardiac and pulmonary disease and treatment options.  Reviews associated critical values for Overweight/Obesity, Hypertension, Cholesterol, and Diabetes.  Discusses basics of heart failure: signs/symptoms and treatments.  Introduces Heart Failure Zone chart for action plan for heart failure.  Written material given at graduation.   Infection Prevention: - Provides verbal and written material to individual with discussion of infection control including proper hand washing and proper equipment cleaning during exercise session. Flowsheet Row Cardiac Rehab from 04/28/2022 in [facility] Cardiac and Pulmonary Rehab  Date 02/15/22   Educator [facility]  Instruction Review Code 1- Verbalizes Understanding       Falls Prevention: - Provides verbal and written material to individual with discussion of falls prevention and safety. Flowsheet Row Cardiac Rehab from 04/28/2022 in [facility] Cardiac and Pulmonary Rehab  Date 02/15/22  Educator [facility]  Instruction Review Code 1- Verbalizes Understanding       Other: -Provides group and verbal instruction on various topics (see comments)   Knowledge Questionnaire Score:  Knowledge Questionnaire Score - 03/29/22 1541       Knowledge Questionnaire Score   Post Score --             Core Components/Risk Factors/Patient Goals at Admission:  Personal Goals and Risk Factors at Admission - 02/15/22 1541       Core Components/Risk Factors/Patient Goals on Admission    Weight Management Yes    Intervention Weight Management: Develop a combined nutrition and exercise program designed to reach desired caloric intake, while maintaining appropriate intake of nutrient and fiber, sodium and fats, and appropriate energy expenditure required for the weight goal.;Weight Management: Provide education and appropriate resources to help participant work on and attain dietary goals.;Weight Management/Obesity: Establish reasonable short term and long term weight goals.    Admit Weight 178 lb 6.4 oz (80.9 kg)    Goal Weight: Short Term 178 lb 6.4 oz (80.9 kg)    Goal Weight: Long Term 178 lb 6.4 oz (80.9 kg)    Expected Outcomes Short Term: Continue to assess and modify interventions until short term weight is achieved;Long Term: Adherence to nutrition and physical activity/exercise program aimed toward attainment of established weight goal;Weight Maintenance: Understanding of the daily nutrition guidelines, which includes 25-35% calories from fat, 7% or less cal from saturated fats, less than 219m cholesterol, less than 1.5gm of sodium, & 5 or more servings of fruits and vegetables daily;Understanding  recommendations for meals to include 15-35% energy as protein, 25-35% energy from fat, 35-60% energy from carbohydrates, less than 2043mof dietary cholesterol, 20-35 gm of total fiber daily;Understanding of distribution of calorie intake throughout the day  with the consumption of 4-5 meals/snacks    Heart Failure Yes    Intervention Provide a combined exercise and nutrition program that is supplemented with education, support and counseling about heart failure. Directed toward relieving symptoms such as shortness of breath, decreased exercise tolerance, and extremity edema.    Expected Outcomes Improve functional capacity of life;Short term: Attendance in program 2-3 days a week with increased exercise capacity. Reported lower sodium intake. Reported increased fruit and vegetable intake. Reports medication compliance.;Short term: Daily weights obtained and reported for increase. Utilizing diuretic protocols set by physician.;Long term: Adoption of self-care skills and reduction of barriers for early signs and symptoms recognition and intervention leading to self-care maintenance.    Hypertension Yes    Intervention Provide education on lifestyle modifcations including regular physical activity/exercise, weight management, moderate sodium restriction and increased consumption of fresh fruit, vegetables, and low fat dairy, alcohol moderation, and smoking cessation.;Monitor prescription use compliance.    Expected Outcomes Short Term: Continued assessment and intervention until BP is < 140/73m HG in hypertensive participants. < 130/852mHG in hypertensive participants with diabetes, heart failure or chronic kidney disease.;Long Term: Maintenance of blood pressure at goal levels.    Lipids Yes    Intervention Provide education and support for participant on nutrition & aerobic/resistive exercise along with prescribed medications to achieve LDL <7057mHDL >62m61m  Expected Outcomes Short Term: Participant  states understanding of desired cholesterol values and is compliant with medications prescribed. Participant is following exercise prescription and nutrition guidelines.;Long Term: Cholesterol controlled with medications as prescribed, with individualized exercise RX and with personalized nutrition plan. Value goals: LDL < 70mg68mL > 40 mg.             Education:Diabetes - Individual verbal and written instruction to review signs/symptoms of diabetes, desired ranges of glucose level fasting, after meals and with exercise. Acknowledge that pre and post exercise glucose checks will be done for 3 sessions at entry of program.   Core Components/Risk Factors/Patient Goals Review:   Goals and Risk Factor Review     Row Name 03/15/22 1605 04/28/22 1730           Core Components/Risk Factors/Patient Goals Review   Personal Goals Review Weight Management/Obesity;Heart Failure;Hypertension;Lipids Weight Management/Obesity;Heart Failure;Hypertension      Review Patient reports that he is taking all medications as prescribed. He reports that he occationally takes his weight at home. While he owns a BP cuff he does not regularly check his BP at home. CalviRoxasnot been checking his weight. We reiterated the importance and to make sure he is noting if he has any abnormal weight gain that could pertain to fluid. He did state he has been experience hematuria recently which he has been in contact with his doctor with. He was highly encouraged to reach out again today to let them know he has been experiencing symptoms currently. His BP at rehab have been running a little low, however, he is staying hydrated and asymptomatic. It was encouraged to buy his own BP cuff to start checking it at home. He is going to graduate the next couple of weeeks      Expected Outcomes Short: start to monitor weight and BP at home. Long: maintain heart healthy lifestyle to help control cardiac risk factors. Short: Buy BP cuff  and start checking at home Long: Continue to manage lifestyle risk factors               Core  Components/Risk Factors/Patient Goals at Discharge (Final Review):   Goals and Risk Factor Review - 04/28/22 1730       Core Components/Risk Factors/Patient Goals Review   Personal Goals Review Weight Management/Obesity;Heart Failure;Hypertension    Review Sami has not been checking his weight. We reiterated the importance and to make sure he is noting if he has any abnormal weight gain that could pertain to fluid. He did state he has been experience hematuria recently which he has been in contact with his doctor with. He was highly encouraged to reach out again today to let them know he has been experiencing symptoms currently. His BP at rehab have been running a little low, however, he is staying hydrated and asymptomatic. It was encouraged to buy his own BP cuff to start checking it at home. He is going to graduate the next couple of weeeks    Expected Outcomes Short: Buy BP cuff and start checking at home Long: Continue to manage lifestyle risk factors             ITP Comments:  ITP Comments     Row Name 02/01/22 1355 02/15/22 1530 02/18/22 1531 03/10/22 0756 04/07/22 0830   ITP Comments Virtual orientation call completed today. he has an appointment on Date: 02/15/2022  for EP eval and gym Orientation.  Documentation of diagnosis can be found in [facility]  Date: 12/23/2025 . Completed 6MWT and gym orientation. Initial ITP created and sent for review to Dr. Emily Filbert, Medical Director. First full day of exercise!  Patient was oriented to gym and equipment including functions, settings, policies, and procedures.  Patient's individual exercise prescription and treatment plan were reviewed.  All starting workloads were established based on the results of the 6 minute walk test done at initial orientation visit.  The plan for exercise progression was also introduced and progression will be  customized based on patient's performance and goals. 30 Day review completed. Medical Director ITP review done, changes made as directed, and signed approval by Medical Director.    NEW 30 Day review completed. Medical Director ITP review done, changes made as directed, and signed approval by Medical Director.    [place] Name 05/05/22 0741           ITP Comments 30 Day review completed. Medical Director ITP review done, changes made as directed, and signed approval by Medical Director.    Out for medical reason                Comments:

## 2022-05-06 ENCOUNTER — Ambulatory Visit (INDEPENDENT_AMBULATORY_CARE_PROVIDER_SITE_OTHER): Payer: Medicare Other | Admitting: Physician Assistant

## 2022-05-06 ENCOUNTER — Ambulatory Visit: Payer: Medicare Other | Admitting: Urology

## 2022-05-06 ENCOUNTER — Ambulatory Visit: Payer: Medicare Other | Admitting: Physician Assistant

## 2022-05-06 DIAGNOSIS — R8289 Other abnormal findings on cytological and histological examination of urine: Secondary | ICD-10-CM | POA: Diagnosis not present

## 2022-05-06 DIAGNOSIS — N138 Other obstructive and reflux uropathy: Secondary | ICD-10-CM

## 2022-05-06 DIAGNOSIS — N401 Enlarged prostate with lower urinary tract symptoms: Secondary | ICD-10-CM

## 2022-05-06 DIAGNOSIS — I255 Ischemic cardiomyopathy: Secondary | ICD-10-CM

## 2022-05-06 LAB — BLADDER SCAN AMB NON-IMAGING
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
## 2022-05-06 NOTE — Progress Notes (Signed)
Catheter Removal  Patient is present today for a catheter removal.  11m of water was drained from the balloon. A 16FR foley cath was removed from the bladder, no complications were noted. Patient tolerated well.  Performed by: JGaspar ColaCMA   Follow up/ Additional notes: Afternoon

## 2022-05-07 ENCOUNTER — Ambulatory Visit (INDEPENDENT_AMBULATORY_CARE_PROVIDER_SITE_OTHER): Payer: Medicare Other | Admitting: Physician Assistant

## 2022-05-07 VITALS — BP 125/62 | HR 75 | Ht 72.0 in | Wt 170.0 lb

## 2022-05-07 DIAGNOSIS — N3001 Acute cystitis with hematuria: Secondary | ICD-10-CM

## 2022-05-07 DIAGNOSIS — R339 Retention of urine, unspecified: Secondary | ICD-10-CM | POA: Diagnosis not present

## 2022-05-07 DIAGNOSIS — R310 Gross hematuria: Secondary | ICD-10-CM | POA: Diagnosis not present

## 2022-05-07 LAB — MICROSCOPIC EXAMINATION
RBC, Urine: >30 /hpf — AB (ref 0–2)
WBC, UA: >30 /hpf — AB (ref 0–5)

## 2022-05-07 LAB — URINALYSIS, COMPLETE
Bilirubin, UA: NEGATIVE
Glucose, UA: NEGATIVE
Ketones, UA: NEGATIVE
Nitrite, UA: NEGATIVE
Specific Gravity, UA: 1.015 (ref 1.005–1.030)
Urobilinogen, Ur: 0.2 mg/dL (ref 0.2–1.0)
pH, UA: 5 (ref 5.0–7.5)

## 2022-05-07 LAB — BLADDER SCAN AMB NON-IMAGING: Scan Result: 376

## 2022-05-07 MED ORDER — NITROFURANTOIN MONOHYD MACRO 100 MG PO CAPS: 100.0000 mg | ORAL_CAPSULE | Freq: Two times a day (BID) | ORAL | 0 refills | Status: AC
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
## 2022-05-10 ENCOUNTER — Telehealth: Payer: Self-pay

## 2022-05-10 ENCOUNTER — Encounter: Payer: Medicare Other | Attending: Internal Medicine | Admitting: *Deleted

## 2022-05-10 DIAGNOSIS — Z955 Presence of coronary angioplasty implant and graft: Secondary | ICD-10-CM | POA: Insufficient documentation

## 2022-05-10 DIAGNOSIS — I252 Old myocardial infarction: Secondary | ICD-10-CM | POA: Diagnosis not present

## 2022-05-10 DIAGNOSIS — Z48812 Encounter for surgical aftercare following surgery on the circulatory system: Secondary | ICD-10-CM | POA: Insufficient documentation

## 2022-05-10 DIAGNOSIS — I213 ST elevation (STEMI) myocardial infarction of unspecified site: Secondary | ICD-10-CM

## 2022-05-10 NOTE — Chronic Care Management (AMB) (Addendum)
Chronic Care Management Pharmacy Assistant   Name: GORAN OLDEN  MRN: [id number] DOB: 12-Jan-1935  Reason for Encounter: Hospital Follow Up  Non CCM   Medications: Outpatient Encounter Medications as of 05/10/2022  Medication Sig   atorvastatin (LIPITOR) 40 MG tablet Take 1 tablet (40 mg total) by mouth daily.   clopidogrel (PLAVIX) 75 MG tablet Take 1 tablet (75 mg total) by mouth daily.   Cyanocobalamin (B-12) 2500 MCG TABS Take 1 tablet by mouth daily.   finasteride (PROSCAR) 5 MG tablet TAKE 1 TABLET BY MOUTH DAILY. GENERIC EQUIVALENT FOR PROSCAR   ketoconazole (NIZORAL) 2 % cream Apply 1 Application topically 2 (two) times daily as needed for irritation.   losartan (COZAAR) 25 MG tablet Take 1 tablet (25 mg total) by mouth daily.   Multiple Vitamins-Minerals (CENTRUM PO) Take 1 tablet by mouth daily.   nitrofurantoin, macrocrystal-monohydrate, (MACROBID) 100 MG capsule Take 1 capsule (100 mg total) by mouth 2 (two) times daily for 7 days.   polyethylene glycol (MIRALAX / GLYCOLAX) 17 g packet Take 17 g by mouth daily as needed for moderate constipation.   spironolactone (ALDACTONE) 25 MG tablet TAKE ONE HALF TABLET (12.'5MG'$ ) BY MOUTH DAILY   tamsulosin (FLOMAX) 0.4 MG CAPS capsule Take 1 capsule (0.4 mg total) by mouth daily.   No facility-administered encounter medications on file as of 05/10/2022.     Reviewed hospital notes for details of recent visit. Has patient been contacted by Transitions of Care team? Yes Has patient seen PCP/specialist for hospital follow up (summarize OV if yes): No    appt.05/17/22  Admitted to the hospital on 04/28/22. Discharge date was 05/02/22.  Discharged from [facility].   Discharge diagnosis (Principal Problem): Gross Hematuria  Patient was discharged to Home  Brief summary of hospital course: Labs/studies ordered: Type and screen, pro time-INR, APTT, CMP, CBC, differential, urinalysis (complete), urine culture.  The patient is on the  cardiac monitor and pulse oximetry.  I also ordered an EKG given his recent cardiac history and his near syncopal episode during triage.  I also ordered a bladder scan to see if he is retaining, possibly secondary to acute clots.At this point, given the patient's continual bleeding, bladder outlet obstruction with hydronephrosis on scan, persistent pain, etc., I feel that he would be safest in the hospital setting until he can continue his hematuria evaluation and be seen by urology.  Urology appt 05/07/22  New?Medications Started at [facility] Discharge:?? -Started cephalexin  Clopidogrel  Polyethylene glycol   Medications Discontinued at Hospital Discharge: -Stopped aspirin  Ciprofloxacin  Brilinta  Medications that remain the same after Hospital Discharge:??  -All other medications will remain the same.    Next CCM appt: none  Other upcoming appts: PCP appointment on 05/17/22  Charlene Brooke, PharmD notified and will determine if action is needed.   Avel Sensor, [place]  [phone]   Pharmacist addendum: The patient was admitted to the hospital on 04/29/2022 and discharged on 05/02/2022.  Came in with gross hematuria.  The patient's Brilinta and aspirin were held.  CT scan showed an 8 cm hyperdense collection within the bladder lumen dependently consistent with a blood clot that appears to arise from the right lobe of the prostate gland resulting in bladder outlet obstruction with moderate bilateral distention and mild bilateral hydronephrosis.  A Foley catheter was placed in the ER.  Urology saw the patient and placed a daily catheter and was able to irrigate the bladder and remove  blood clots.  The patient was put on continuous bladder irrigation.  The next morning the urine was clear.  Clearance by urology and cardiology to restart Plavix alone.  Neurology recommended empiric antibiotics.  On 9/30 we tried a voiding trial, the initial urination was good but then had  frequent low-volume urinations and was retaining urine.  Urine culture was sent after Foley catheter was taken out.  Needed to straight catheter overnight.  On 05/02/2022 the patient had a Foley catheter placed by nursing staff secondary to greater than 642m in the bladder.  Patient was discharged home with Foley catheter.  Follow-up with urology for voiding trial.  Hemoglobin on admission 12.8.  Hemoglobin upon discharge 10.8.  Patient has had appropriate follow up with urology. PCP appt next week. Nothing further needed at this time.  LCharlene Brooke PharmD, BCACP 05/13/22 4:24 PM

## 2022-05-10 NOTE — Progress Notes (Signed)
Daily Session Note  Patient Details  Name: [name] MRN: [id number] Date of Birth: [birth year] Referring Provider:   Flowsheet Row Cardiac Rehab from 02/15/2022 in [facility]  Referring Provider End       Encounter Date: 05/10/2022  Check In:  Session Check In - 05/10/22 1616       Check-In   Supervising physician immediately available to respond to emergencies See telemetry face sheet for immediately available ER MD    Location [facility]    Staff Present Justin Mend, RCP,RRT,BSRT;Alette Kataoka Sherryll Burger, RN BSN;Noah Tickle, BS, Exercise Physiologist    Virtual Visit No    Medication changes reported     No    Fall or balance concerns reported    No    Warm-up and Cool-down Performed on first and last piece of equipment    Resistance Training Performed Yes    VAD Patient? No    PAD/SET Patient? No      Pain Assessment   Currently in Pain? No/denies                Social History   Tobacco Use  Smoking Status Never  Smokeless Tobacco Never    Goals Met:  Independence with exercise equipment Exercise tolerated well No report of concerns or symptoms today Strength training completed today  Goals Unmet:  Not Applicable  Comments: Pt able to follow exercise prescription today without complaint.  Will continue to monitor for progression.    Dr. Emily Filbert is Medical Director for [facility].  Dr. Ottie Glazier is Medical Director for [facility] Pulmonary [facility].

## 2022-05-11 LAB — CULTURE, URINE COMPREHENSIVE

## 2022-05-12 ENCOUNTER — Ambulatory Visit (INDEPENDENT_AMBULATORY_CARE_PROVIDER_SITE_OTHER): Payer: Medicare Other | Admitting: Urology

## 2022-05-12 VITALS — BP 101/63 | HR 103 | Ht 72.0 in | Wt 170.0 lb

## 2022-05-12 DIAGNOSIS — N401 Enlarged prostate with lower urinary tract symptoms: Secondary | ICD-10-CM | POA: Diagnosis not present

## 2022-05-12 DIAGNOSIS — R310 Gross hematuria: Secondary | ICD-10-CM

## 2022-05-12 DIAGNOSIS — I213 ST elevation (STEMI) myocardial infarction of unspecified site: Secondary | ICD-10-CM

## 2022-05-12 DIAGNOSIS — N138 Other obstructive and reflux uropathy: Secondary | ICD-10-CM

## 2022-05-12 DIAGNOSIS — Z955 Presence of coronary angioplasty implant and graft: Secondary | ICD-10-CM

## 2022-05-12 DIAGNOSIS — N3001 Acute cystitis with hematuria: Secondary | ICD-10-CM | POA: Diagnosis not present

## 2022-05-12 LAB — BLADDER SCAN AMB NON-IMAGING: Scan Result: 59

## 2022-05-12 NOTE — Progress Notes (Addendum)
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 depression.   Education: Stress, Anxiety, and Depression - Group verbal and visual presentation to define topics covered.  Reviews how body is impacted by stress, anxiety, and depression.  Also discusses healthy ways to reduce stress and to treat/manage anxiety and depression.  Written material given at graduation.   Education: Sleep Hygiene -Provides group verbal and written instruction about how sleep can affect your health.  Define sleep hygiene, discuss sleep cycles and impact of sleep habits. Review good sleep hygiene tips.    Initial Review & Psychosocial Screening:  Initial Psych Review & Screening - 02/01/22 1343  Initial Review   Current issues with None Identified      Family Dynamics   Good Support System? Yes   wife,Berniece<     Barriers   Psychosocial barriers to participate in program There are no  identifiable barriers or psychosocial needs.      Screening Interventions   Interventions Encouraged to exercise;To provide support and resources with identified psychosocial needs;Provide feedback about the scores to participant    Expected Outcomes Short Term goal: Utilizing psychosocial counselor, staff and physician to assist with identification of specific Stressors or current issues interfering with healing process. Setting desired goal for each stressor or current issue identified.;Long Term Goal: Stressors or current issues are controlled or eliminated.;Short Term goal: Identification and review with participant of any Quality of Life or Depression concerns found by scoring the questionnaire.;Long Term goal: The participant improves quality of Life and PHQ9 Scores as seen by post scores and/or verbalization of changes             Quality of Life Scores:   Quality of Life - 03/29/22 1540       Quality of Life   Select --      Quality of Life Scores   Health/Function Pre --    Health/Function Post --    Health/Function % Change --    Socioeconomic Pre --    Socioeconomic Post --    Socioeconomic % Change  --    Psych/Spiritual Pre --    Psych/Spiritual Post --    Psych/Spiritual % Change --    Family Pre --    Family Post --    Family % Change --    GLOBAL Pre --    GLOBAL Post --    GLOBAL % Change --            Scores of 19 and below usually indicate a poorer quality of life in these areas.  A difference of  2-3 points is a clinically meaningful difference.  A difference of 2-3 points in the total score of the Quality of Life Index has been associated with significant improvement in overall quality of life, self-image, physical symptoms, and general health in studies assessing change in quality of life.  PHQ-9: Review Flowsheet  More data exists      02/15/2022 11/10/2021 10/31/2020 09/12/2018 09/06/2017  Depression screen PHQ 2/9  Decreased Interest 0 0 0 0 0   Down, Depressed, Hopeless 0 0 0 0 0  PHQ - 2 Score 0 0 0 0 0  Altered sleeping 0 - - - -  Tired, decreased energy 3 - - - -  Change in appetite 0 - - - -  Feeling bad or failure about yourself  0 - - - -  Trouble concentrating 0 - - - -  Moving slowly or fidgety/restless 0 - - - -  Suicidal thoughts 0 - - - -  PHQ-9 Score 3 - - - -  Difficult doing work/chores Not difficult at all - - - -   Interpretation of Total Score  Total Score Depression Severity:  1-4 = Minimal depression, 5-9 = Mild depression, 10-14 = Moderate depression, 15-19 = Moderately severe depression, 20-27 = Severe depression   Psychosocial Evaluation and Intervention:  Psychosocial Evaluation - 02/01/22 1351       Psychosocial Evaluation & Interventions   Interventions Encouraged to exercise with the program and follow exercise prescription    Comments Jibreel has no barriers to attending the program. 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 His wife is his support. He stated no concerns with stres, depression nor anxiety.    Expected Outcomes STG Breyer is able to attend all scheduled sessions, he is able to progress his exercise and learn how to be heart healthy. LTG Tashawn continues to progress and utilize all he learned after discharge    Continue Psychosocial Services  Follow up required by staff             Psychosocial Re-Evaluation:  Psychosocial Re-Evaluation     [place] Name 03/15/22 1602 04/28/22 1727           Psychosocial Re-Evaluation   Current issues with None Identified None Identified      Comments Harl reports no new stress or sleep concerns. 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 He did state that is a hard thing to do. Kie denied any big concerns with mental health or sleep. He states he is doing well and staying busy with his cattle on his farm which he is ready to sell in  the next month. He is keeping 3 of them on top of his bull and chickens. He enjoys his wife who is good support for hin. He is getting ready to graduate in the next couple of weeks and has really enjoyed the program.      Expected Outcomes Short: continue to attend cardiac rehab consistenly for mental health benefits. Long: mantain positive mental health habits. Short: Graduate Long: Graduate from [employer] and continue to maintain positive attitude      Interventions -- Encouraged to attend Cardiac Rehabilitation for the exercise      Continue Psychosocial Services  Follow up required by staff Follow up required by staff               Psychosocial Discharge (Final Psychosocial Re-Evaluation):  Psychosocial Re-Evaluation - 04/28/22 1727       Psychosocial Re-Evaluation   Current issues with None Identified    Comments Kavin denied any big concerns with mental health or sleep. 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 He is keeping 3 of them on top of his bull and chickens. He enjoys his wife who is good support for hin. He is getting ready to graduate in the next couple of weeks and has really enjoyed the program.    Expected Outcomes Short: Graduate Long: Graduate from [facility] and continue to maintain positive attitude    Interventions Encouraged to attend Cardiac Rehabilitation for the exercise    Continue Psychosocial Services  Follow up required by staff             Vocational Rehabilitation: Provide vocational rehab assistance to qualifying candidates.   Vocational Rehab Evaluation & Intervention:   Education: Education Goals: Education classes will be provided on a variety of topics geared toward better understanding of heart health and risk factor modification. Participant will state understanding/return demonstration of topics presented as noted by education test scores.  Learning  Barriers/Preferences:   General Cardiac Education Topics:  AED/CPR: - Group verbal and written instruction with the use of models to demonstrate the basic use of the AED with the basic ABC's of resuscitation.   Anatomy and Cardiac Procedures: - Group verbal and visual presentation  and models provide information about basic cardiac anatomy and function. Reviews the testing methods done to diagnose heart disease and the outcomes of the test results. Describes the treatment choices: Medical Management, Angioplasty, or Coronary Bypass Surgery for treating various heart conditions including Myocardial Infarction, Angina, Valve Disease, and Cardiac Arrhythmias.  Written material given at graduation.   Medication Safety: - Group verbal and visual instruction to review commonly prescribed medications for heart and lung disease. Reviews the medication, class of the drug, and side effects. Includes the steps to properly store meds and maintain the prescription regimen.  Written material given at graduation.   Intimacy: - Group verbal instruction through game format to discuss how heart and lung disease can affect sexual intimacy. Written material given at graduation..   Know Your Numbers and Heart Failure: - Group verbal and visual instruction to discuss disease risk factors for cardiac and pulmonary disease and treatment options.  Reviews associated critical values for Overweight/Obesity, Hypertension, Cholesterol, and Diabetes.  Discusses basics of heart failure: signs/symptoms and treatments.  Introduces Heart Failure Zone chart for action plan for heart failure.  Written material given at graduation.   Infection Prevention: - Provides verbal and written material to individual with discussion of infection control including proper hand washing and proper equipment cleaning during exercise session. Flowsheet Row Cardiac Rehab from 04/28/2022 in [facility] Cardiac and Pulmonary Rehab  Date 02/15/22   Educator [facility]  Instruction Review Code 1- Verbalizes Understanding       Falls Prevention: - Provides verbal and written material to individual with discussion of falls prevention and safety. Flowsheet Row Cardiac Rehab from 04/28/2022 in [facility] Cardiac and Pulmonary Rehab  Date 02/15/22  Educator [facility]  Instruction Review Code 1- Verbalizes Understanding       Other: -Provides group and verbal instruction on various topics (see comments)   Knowledge Questionnaire Score:  Knowledge Questionnaire Score - 03/29/22 1541       Knowledge Questionnaire Score   Post Score --             Core Components/Risk Factors/Patient Goals at Admission:  Personal Goals and Risk Factors at Admission - 02/15/22 1541       Core Components/Risk Factors/Patient Goals on Admission    Weight Management Yes    Intervention Weight Management: Develop a combined nutrition and exercise program designed to reach desired caloric intake, while maintaining appropriate intake of nutrient and fiber, sodium and fats, and appropriate energy expenditure required for the weight goal.;Weight Management: Provide education and appropriate resources to help participant work on and attain dietary goals.;Weight Management/Obesity: Establish reasonable short term and long term weight goals.    Admit Weight 178 lb 6.4 oz (80.9 kg)    Goal Weight: Short Term 178 lb 6.4 oz (80.9 kg)    Goal Weight: Long Term 178 lb 6.4 oz (80.9 kg)    Expected Outcomes Short Term: Continue to assess and modify interventions until short term weight is achieved;Long Term: Adherence to nutrition and physical activity/exercise program aimed toward attainment of established weight goal;Weight Maintenance: Understanding of the daily nutrition guidelines, which includes 25-35% calories from fat, 7% or less cal from saturated fats, less than 252m cholesterol, less than 1.5gm of sodium, & 5 or more servings of fruits and vegetables daily;Understanding  recommendations for meals to include 15-35% energy as protein, 25-35% energy from fat, 35-60% energy from carbohydrates, less than 2029mof dietary cholesterol, 20-35 gm of total fiber daily;Understanding of distribution of calorie intake throughout the day  with the consumption of 4-5 meals/snacks    Heart Failure Yes    Intervention Provide a combined exercise and nutrition program that is supplemented with education, support and counseling about heart failure. Directed toward relieving symptoms such as shortness of breath, decreased exercise tolerance, and extremity edema.    Expected Outcomes Improve functional capacity of life;Short term: Attendance in program 2-3 days a week with increased exercise capacity. Reported lower sodium intake. Reported increased fruit and vegetable intake. Reports medication compliance.;Short term: Daily weights obtained and reported for increase. Utilizing diuretic protocols set by physician.;Long term: Adoption of self-care skills and reduction of barriers for early signs and symptoms recognition and intervention leading to self-care maintenance.    Hypertension Yes    Intervention Provide education on lifestyle modifcations including regular physical activity/exercise, weight management, moderate sodium restriction and increased consumption of fresh fruit, vegetables, and low fat dairy, alcohol moderation, and smoking cessation.;Monitor prescription use compliance.    Expected Outcomes Short Term: Continued assessment and intervention until BP is < 140/75m HG in hypertensive participants. < 130/839mHG in hypertensive participants with diabetes, heart failure or chronic kidney disease.;Long Term: Maintenance of blood pressure at goal levels.    Lipids Yes    Intervention Provide education and support for participant on nutrition & aerobic/resistive exercise along with prescribed medications to achieve LDL <7017mHDL >69m55m  Expected Outcomes Short Term: Participant  states understanding of desired cholesterol values and is compliant with medications prescribed. Participant is following exercise prescription and nutrition guidelines.;Long Term: Cholesterol controlled with medications as prescribed, with individualized exercise RX and with personalized nutrition plan. Value goals: LDL < 70mg66mL > 40 mg.             Education:Diabetes - Individual verbal and written instruction to review signs/symptoms of diabetes, desired ranges of glucose level fasting, after meals and with exercise. Acknowledge that pre and post exercise glucose checks will be done for 3 sessions at entry of program.   Core Components/Risk Factors/Patient Goals Review:   Goals and Risk Factor Review     Row Name 03/15/22 1605 04/28/22 1730           Core Components/Risk Factors/Patient Goals Review   Personal Goals Review Weight Management/Obesity;Heart Failure;Hypertension;Lipids Weight Management/Obesity;Heart Failure;Hypertension      Review Patient reports that he is taking all medications as prescribed. He reports that he occationally takes his weight at home. While he owns a BP cuff he does not regularly check his BP at home. CalviUlricknot been checking his weight. We reiterated the importance and to make sure he is noting if he has any abnormal weight gain that could pertain to fluid. He did state he has been experience hematuria recently which he has been in contact with his doctor with. He was highly encouraged to reach out again today to let them know he has been experiencing symptoms currently. His BP at rehab have been running a little low, however, he is staying hydrated and asymptomatic. It was encouraged to buy his own BP cuff to start checking it at home. He is going to graduate the next couple of weeeks      Expected Outcomes Short: start to monitor weight and BP at home. Long: maintain heart healthy lifestyle to help control cardiac risk factors. Short: Buy BP cuff  and start checking at home Long: Continue to manage lifestyle risk factors               Core  Components/Risk Factors/Patient Goals at Discharge (Final Review):   Goals and Risk Factor Review - 04/28/22 1730       Core Components/Risk Factors/Patient Goals Review   Personal Goals Review Weight Management/Obesity;Heart Failure;Hypertension    Review Trev has not been checking his weight. We reiterated the importance and to make sure he is noting if he has any abnormal weight gain that could pertain to fluid. He did state he has been experience hematuria recently which he has been in contact with his doctor with. He was highly encouraged to reach out again today to let them know he has been experiencing symptoms currently. His BP at rehab have been running a little low, however, he is staying hydrated and asymptomatic. It was encouraged to buy his own BP cuff to start checking it at home. He is going to graduate the next couple of weeeks    Expected Outcomes Short: Buy BP cuff and start checking at home Long: Continue to manage lifestyle risk factors             ITP Comments:  ITP Comments     Row Name 02/01/22 1355 02/15/22 1530 02/18/22 1531 03/10/22 0756 04/07/22 0830   ITP Comments Virtual orientation call completed today. he has an appointment on Date: 02/15/2022  for EP eval and gym Orientation.  Document[facility] of diagnosis [facility] in [facility]  Date: 12/23/2025 . Completed 6MWT and gym orientation. Initial ITP created and sent for review to Dr. Emily Filbert, Medical Director. First full day of exercise!  Patient was oriented to gym and equipment including functions, settings, policies, and procedures.  Patient's individual exercise prescription and treatment plan were reviewed.  All starting workloads were established based on the results of the 6 minute walk test done at initial orientation visit.  The plan for exercise progression was also introduced and progression will be  customized based on patient's performance and goals. 30 Day review completed. Medical Director ITP review done, changes made as directed, and signed approval by Medical Director.    NEW 30 Day review completed. Medical Director ITP review done, changes made as directed, and signed approval by Medical Director.    [place] Name 05/05/22 0741 05/12/22 1507         ITP Comments 30 Day review completed. Medical Director ITP review done, changes made as directed, and signed approval by Medical Director.    Out for medical reason Patient arrived to clinic letting us know that his provider told him to stop rehab due to ongoing blood in his urine. He wanted to discharge at this time, he had 4 sessions left. Staff provided him with his graduation folder and told him to reach out with any questions.               Comments: Discharge ITP

## 2022-05-12 NOTE — Progress Notes (Signed)
Discharge Progress Report  Patient Details  Name: [name] MRN: [id number] Date of Birth: 06-11-1935 Referring Provider:   Flowsheet Row Cardiac Rehab from 02/15/2022 in [facility] Cardiac and Pulmonary Rehab  Referring Provider End        Number of Visits: 32  Reason for Discharge:  Patient reached a stable level of exercise. Patient independent in their exercise. Patient has met program and personal goals. Early Exit:  Medical. Per MD to stop rehab due to hematuria He had 4 sessions left  Smoking History:  Social History   Tobacco Use  Smoking Status Never  Smokeless Tobacco Never    Diagnosis:  ST elevation myocardial infarction (STEMI), unspecified artery (HCC)  Status post coronary artery stent placement  ADL [facility]:   Initial Exercise Prescription:  Initial Exercise Prescription - 02/15/22 1500       Date of Initial Exercise RX and Referring Provider   Date 02/15/22    Referring Provider End      Oxygen   Maintain Oxygen Saturation 88% or higher      NuStep   Level 11    SPM 80    Minutes 15    METs 3.16      Arm Ergometer   Level 1    RPM 30    Minutes 15    METs 3.16      Track   Laps 25    Minutes 15    METs 2.86      Prescription Details   Frequency (times per week) 3    Duration Progress to 30 minutes of continuous aerobic without signs/symptoms of physical distress      Intensity   THRR 40-80% of Max Heartrate 95-122    Ratings of Perceived Exertion 11-13    Perceived Dyspnea 0-4      Progression   Progression Continue to progress workloads to maintain intensity without signs/symptoms of physical distress.      Resistance Training   Training Prescription Yes    Weight 2    Reps 10-15             Discharge Exercise Prescription (Final Exercise Prescription Changes):  Exercise Prescription Changes - 05/03/22 1400       Response to Exercise   Blood Pressure (Admit) 98/58    Blood Pressure (Exit) 102/62    Heart Rate  (Admit) 75 bpm    Heart Rate (Exercise) 107 bpm    Heart Rate (Exit) 86 bpm    Oxygen Saturation (Admit) 95 %    Oxygen Saturation (Exercise) 93 %    Oxygen Saturation (Exit) 98 %    Rating of Perceived Exertion (Exercise) 13    Symptoms none    Duration Continue with 30 min of aerobic exercise without signs/symptoms of physical distress.    Intensity THRR unchanged      Progression   Progression Continue to progress workloads to maintain intensity without signs/symptoms of physical distress.    Average METs 2.5      Resistance Training   Training Prescription Yes    Weight 3 lb    Reps 10-15      Interval Training   Interval Training No      Recumbant Bike   Level 3    Watts 22    Minutes 15      NuStep   Level 3    Minutes 15    METs 2.6      Arm Ergometer   Level 1  Minutes 15    METs 2      Track   Laps 30    Minutes 15    METs 2.63      Home Exercise Plan   Plans to continue exercise at Home (comment)   walking/working farm, PT exercises   Frequency Add 2 additional days to program exercise sessions.    Initial Home Exercises Provided 03/18/22      Oxygen   Maintain Oxygen Saturation 88% or higher             Functional Capacity:  6 Minute Walk     Row Name 02/15/22 1531 04/28/22 1719       6 Minute Walk   Phase Initial Discharge    Distance 965 feet 1360 feet    Distance % Change -- 40.9 %    Distance Feet Change -- 395 ft    Walk Time 6 minutes 6 minutes    # of Rest Breaks 0 0    MPH 1.83 2.57    METS 3.16 2.53  Pre: 1.89 METS    RPE 11 11    Perceived Dyspnea  0 0    VO2 Peak 11.05 8.87  Pre: 6.64    Symptoms No No    Resting HR 69 bpm 75 bpm    Resting BP 120/60 98/58    Resting Oxygen Saturation  98 % 95 %    Exercise Oxygen Saturation  during 6 min walk 97 % 94 %    Max Ex. HR 108 bpm 104 bpm    Max Ex. BP 142/70 136/66    2 Minute Post BP 112/70 --               Nutrition & Weight - Outcomes:  Pre Biometrics -  02/15/22 1538       Pre Biometrics   Height _0  (1.803 m)    Weight 178 lb 6.4 oz (80.9 kg)    BMI (Calculated) 24.89    Single Leg Stand 5.56 seconds             Post Biometrics - 04/28/22 1721        Post  Biometrics   Height _1  (1.803 m)    Weight 174 lb 3.2 oz (79 kg)    BMI (Calculated) 24.31    Single Leg Stand 18.3 seconds             Nutrition:  Nutrition Therapy & Goals - 04/28/22 1645       Nutrition Therapy   RD appointment deferred Yes              Goals reviewed with patient; copy given to patient.
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
## 2022-05-13 ENCOUNTER — Other Ambulatory Visit: Payer: Self-pay | Admitting: Urology

## 2022-05-13 ENCOUNTER — Encounter: Payer: Self-pay | Admitting: Urology

## 2022-05-13 LAB — URINALYSIS, COMPLETE
Bilirubin, UA: NEGATIVE
Glucose, UA: NEGATIVE
Nitrite, UA: NEGATIVE
Specific Gravity, UA: 1.025 (ref 1.005–1.030)
Urobilinogen, Ur: 1 mg/dL (ref 0.2–1.0)
pH, UA: 5 (ref 5.0–7.5)

## 2022-05-13 LAB — MICROSCOPIC EXAMINATION: RBC, Urine: >30 /hpf — AB (ref 0–2)

## 2022-05-13 NOTE — Progress Notes (Signed)
Surgical Physician Order Form [facility] [place]  * Scheduling expectation : Next Available  *Length of Case: 45 minutes  *Clearance needed: yes-on Plavix; history MI May 2023  *Anticoagulation Instructions: Hold all anticoagulants  *Aspirin Instructions: N/A  *Post-op visit Date/Instructions:  1-2 week with pathology review  *Diagnosis: Recurrent gross hematuria  *Procedure: Bilateral retrograde pyelogram; possible bladder biopsy; possible prostate fulguration    Additional orders: N/A  -Admit type: OUTpatient  -Anesthesia: Choice  -VTE Prophylaxis Standing Order SCD's       Other:   -Standing Lab Orders Per Anesthesia    Lab other: UA&Urine Culture  -Standing Test orders EKG/Chest x-ray per Anesthesia       Test other:   - Medications:  Ceftriaxone(Rocephin) 1gm IV  -Other orders:  N/A   May not be able to get clearance due to recent MI

## 2022-05-17 ENCOUNTER — Encounter: Payer: Self-pay | Admitting: Internal Medicine

## 2022-05-17 ENCOUNTER — Inpatient Hospital Stay: Payer: Medicare Other | Admitting: Internal Medicine

## 2022-05-17 ENCOUNTER — Telehealth: Payer: Self-pay

## 2022-05-17 ENCOUNTER — Ambulatory Visit (INDEPENDENT_AMBULATORY_CARE_PROVIDER_SITE_OTHER): Payer: Medicare Other | Admitting: Internal Medicine

## 2022-05-17 DIAGNOSIS — N138 Other obstructive and reflux uropathy: Secondary | ICD-10-CM

## 2022-05-17 DIAGNOSIS — I255 Ischemic cardiomyopathy: Secondary | ICD-10-CM

## 2022-05-17 DIAGNOSIS — N401 Enlarged prostate with lower urinary tract symptoms: Secondary | ICD-10-CM | POA: Diagnosis not present

## 2022-05-17 DIAGNOSIS — I5022 Chronic systolic (congestive) heart failure: Secondary | ICD-10-CM

## 2022-05-17 DIAGNOSIS — R310 Gross hematuria: Secondary | ICD-10-CM | POA: Diagnosis not present

## 2022-05-17 DIAGNOSIS — I25118 Atherosclerotic heart disease of native coronary artery with other forms of angina pectoris: Secondary | ICD-10-CM | POA: Diagnosis not present

## 2022-05-17 NOTE — Assessment & Plan Note (Signed)
Seems to be from the prostate Dr Bernardo Heater is planning cysto under anaesthesia---to assess and hopefully treat
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
## 2022-05-17 NOTE — Telephone Encounter (Signed)
Tried calling patient to schedule surgery, no answer. Did leave a message for patient to return my call to schedule.

## 2022-05-17 NOTE — Assessment & Plan Note (Signed)
Compensated on the losartan and spironolactone 25

## 2022-05-17 NOTE — Assessment & Plan Note (Signed)
No recent symptoms Is back on the plavix Atorvastatin 40, losartan 25

## 2022-05-17 NOTE — Assessment & Plan Note (Signed)
Voiding okay now on the finasteride '5mg'$  daily PVR low last week No catheter needed

## 2022-05-17 NOTE — Telephone Encounter (Signed)
error 

## 2022-05-20 NOTE — Telephone Encounter (Signed)
Tried calling patient, no answer. Did leave detailed message, will try again.

## 2022-05-21 DIAGNOSIS — Z23 Encounter for immunization: Secondary | ICD-10-CM | POA: Diagnosis not present

## 2022-05-28 ENCOUNTER — Ambulatory Visit: Payer: Medicare Other | Attending: Medical | Admitting: Medical

## 2022-05-28 ENCOUNTER — Other Ambulatory Visit
Admission: RE | Admit: 2022-05-28 | Discharge: 2022-05-28 | Disposition: A | Discharge status: Home / Self Care | Payer: Medicare Other | Source: Ambulatory Visit | Attending: Medical | Admitting: Medical

## 2022-05-28 ENCOUNTER — Encounter: Payer: Self-pay | Admitting: Medical

## 2022-05-28 VITALS — BP 100/50 | HR 55 | Ht 72.0 in | Wt 173.4 lb

## 2022-05-28 DIAGNOSIS — D649 Anemia, unspecified: Secondary | ICD-10-CM | POA: Diagnosis not present

## 2022-05-28 DIAGNOSIS — I255 Ischemic cardiomyopathy: Secondary | ICD-10-CM | POA: Diagnosis not present

## 2022-05-28 DIAGNOSIS — R001 Bradycardia, unspecified: Secondary | ICD-10-CM | POA: Diagnosis not present

## 2022-05-28 DIAGNOSIS — I2510 Atherosclerotic heart disease of native coronary artery without angina pectoris: Secondary | ICD-10-CM

## 2022-05-28 DIAGNOSIS — R319 Hematuria, unspecified: Secondary | ICD-10-CM | POA: Diagnosis not present

## 2022-05-28 DIAGNOSIS — I5020 Unspecified systolic (congestive) heart failure: Secondary | ICD-10-CM

## 2022-05-28 LAB — CBC
HCT: 38.3 % — ABNORMAL LOW (ref 39.0–52.0)
Hemoglobin: 12.5 g/dL — ABNORMAL LOW (ref 13.0–17.0)
MCH: 31.1 pg (ref 26.0–34.0)
MCHC: 32.6 g/dL (ref 30.0–36.0)
MCV: 95.3 fL (ref 80.0–100.0)
Platelets: 168 10*3/uL (ref 150–400)
RBC: 4.02 MIL/uL — ABNORMAL LOW (ref 4.22–5.81)
RDW: 13.2 % (ref 11.5–15.5)
WBC: 6.5 10*3/uL (ref 4.0–10.5)
nRBC: 0 % (ref 0.0–0.2)
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
## 2022-05-28 NOTE — Patient Instructions (Signed)
Medication Instructions:   Your physician recommends that you continue on your current medications as directed. Please refer to the Current Medication list given to you today.  *If you need a refill on your cardiac medications before your next appointment, please call your pharmacy*   Lab Work:  Your physician recommends you have lab work at the medical mall today.   If you have labs (blood work) drawn today and your tests are completely normal, you will receive your results only by: [place] (if you have MyChart) OR A paper copy in the mail If you have any lab test that is abnormal or we need to change your treatment, we will call you to review the results.   Testing/Procedures:  None Ordered   Follow-Up: At [facility], you and your health needs are our priority.  As part of our continuing mission to provide you with exceptional heart care, we have created designated Provider Care Teams.  These Care Teams include your primary Cardiologist (physician) and Advanced Practice Providers (APPs -  Physician Assistants and Nurse Practitioners) who all work together to provide you with the care you need, when you need it.  We recommend signing up for the patient portal called "MyChart".  Sign up information is provided on this After Visit Summary.  MyChart is used to connect with patients for Virtual Visits (Telemedicine).  Patients are able to view lab/test results, encounter notes, upcoming appointments, etc.  Non-urgent messages can be sent to your provider as well.   To learn more about what you can do with MyChart, go to [email or web address].    Your next appointment:   3 month(s)  The format for your next appointment:   In Person  Provider:   Nelva Bush, MD

## 2022-06-02 ENCOUNTER — Encounter: Payer: Self-pay | Admitting: *Deleted

## 2022-06-04 ENCOUNTER — Encounter: Payer: Self-pay | Admitting: Urology

## 2022-06-04 ENCOUNTER — Ambulatory Visit (INDEPENDENT_AMBULATORY_CARE_PROVIDER_SITE_OTHER): Payer: Medicare Other | Admitting: Urology

## 2022-06-04 VITALS — BP 126/67 | HR 67 | Ht 72.0 in | Wt 168.0 lb

## 2022-06-04 DIAGNOSIS — R310 Gross hematuria: Secondary | ICD-10-CM

## 2022-06-04 DIAGNOSIS — R8281 Pyuria: Secondary | ICD-10-CM

## 2022-06-04 DIAGNOSIS — R339 Retention of urine, unspecified: Secondary | ICD-10-CM | POA: Diagnosis not present

## 2022-06-04 DIAGNOSIS — N3001 Acute cystitis with hematuria: Secondary | ICD-10-CM | POA: Diagnosis not present

## 2022-06-04 DIAGNOSIS — R8289 Other abnormal findings on cytological and histological examination of urine: Secondary | ICD-10-CM

## 2022-06-04 LAB — MICROSCOPIC EXAMINATION
RBC, Urine: >30 /hpf — AB (ref 0–2)
WBC, UA: >30 /hpf — AB (ref 0–5)

## 2022-06-04 LAB — URINALYSIS, COMPLETE
Bilirubin, UA: NEGATIVE
Glucose, UA: NEGATIVE
Ketones, UA: NEGATIVE
Nitrite, UA: NEGATIVE
Specific Gravity, UA: 1.03 (ref 1.005–1.030)
Urobilinogen, Ur: 1 mg/dL (ref 0.2–1.0)
pH, UA: 5 (ref 5.0–7.5)

## 2022-06-04 LAB — BLADDER SCAN AMB NON-IMAGING: Scan Result: 51
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
## 2022-06-07 LAB — CULTURE, URINE COMPREHENSIVE

## 2022-06-09 ENCOUNTER — Telehealth: Payer: Self-pay | Admitting: *Deleted

## 2022-06-09 DIAGNOSIS — R310 Gross hematuria: Secondary | ICD-10-CM

## 2022-06-09 MED ORDER — AMOXICILLIN 875 MG PO TABS: 875.0000 mg | ORAL_TABLET | Freq: Two times a day (BID) | ORAL | 0 refills | Status: AC

## 2022-06-09 NOTE — Telephone Encounter (Signed)
-----   Message from Abbie Sons, MD sent at 06/08/2022  7:43 PM EST ----- Urine culture is growing bacteria.  Please send in Rx amoxicillin 875 mg twice daily x14 days.  Follow-up urinalysis and possible urine cytology 10 days.  Please schedule this lab visit on a day I am in clinic and not a Tuesday.

## 2022-06-09 NOTE — Telephone Encounter (Signed)
Notified patient as instructed, patient pleased. Discussed follow-up appointments, patient agrees  

## 2022-06-11 NOTE — Telephone Encounter (Signed)
Patient and wife would like to hold off on surgery, will discuss with Dr. Bernardo Heater further. Will remove from Surgery Coordinator InBasket.

## 2022-06-17 ENCOUNTER — Other Ambulatory Visit: Payer: Medicare Other

## 2022-06-17 DIAGNOSIS — R310 Gross hematuria: Secondary | ICD-10-CM

## 2022-06-17 LAB — URINALYSIS, COMPLETE
Bilirubin, UA: NEGATIVE
Glucose, UA: NEGATIVE
Ketones, UA: NEGATIVE
Nitrite, UA: NEGATIVE
Specific Gravity, UA: 1.02 (ref 1.005–1.030)
Urobilinogen, Ur: 1 mg/dL (ref 0.2–1.0)
pH, UA: 7 (ref 5.0–7.5)

## 2022-06-17 LAB — MICROSCOPIC EXAMINATION: WBC, UA: >30 /hpf — AB (ref 0–5)

## 2022-06-18 ENCOUNTER — Other Ambulatory Visit: Payer: Medicare Other

## 2022-06-21 LAB — CULTURE, URINE COMPREHENSIVE

## 2022-06-22 ENCOUNTER — Telehealth: Payer: Self-pay | Admitting: *Deleted

## 2022-06-22 ENCOUNTER — Other Ambulatory Visit: Payer: Self-pay | Admitting: Internal Medicine

## 2022-06-22 NOTE — Telephone Encounter (Signed)
-----   Message from Abbie Sons, MD sent at 06/22/2022  7:32 AM EST ----- Urine culture was negative.  Please schedule lab visit for repeat urine cytology

## 2022-06-22 NOTE — Telephone Encounter (Signed)
Notified patient as instructed, patient pleased. Discussed follow-up appointments, patient agrees  

## 2022-06-28 ENCOUNTER — Other Ambulatory Visit: Payer: Self-pay

## 2022-06-28 DIAGNOSIS — R8289 Other abnormal findings on cytological and histological examination of urine: Secondary | ICD-10-CM

## 2022-06-29 ENCOUNTER — Other Ambulatory Visit: Payer: Medicare Other

## 2022-06-29 ENCOUNTER — Other Ambulatory Visit: Payer: Self-pay

## 2022-06-29 DIAGNOSIS — R8289 Other abnormal findings on cytological and histological examination of urine: Secondary | ICD-10-CM

## 2022-06-29 MED ORDER — CLOPIDOGREL BISULFATE 75 MG PO TABS: 75.0000 mg | ORAL_TABLET | Freq: Every day | ORAL | 0 refills | Status: DC

## 2022-07-01 LAB — CYTOLOGY - NON PAP

## 2022-07-02 ENCOUNTER — Telehealth: Payer: Self-pay | Admitting: *Deleted

## 2022-07-02 NOTE — Telephone Encounter (Signed)
Left message on voice mail per DPR .  °

## 2022-07-02 NOTE — Telephone Encounter (Signed)
-----   Message from Abbie Sons, MD sent at 07/01/2022  9:52 PM EST ----- Repeat urine cytology showed no abnormal cells

## 2022-07-12 ENCOUNTER — Other Ambulatory Visit: Payer: Self-pay | Admitting: Internal Medicine

## 2022-07-12 ENCOUNTER — Other Ambulatory Visit: Payer: Self-pay

## 2022-07-12 MED ORDER — ATORVASTATIN CALCIUM 40 MG PO TABS: 40.0000 mg | ORAL_TABLET | Freq: Every day | ORAL | 0 refills | Status: DC

## 2022-09-03 ENCOUNTER — Ambulatory Visit: Payer: Medicare Other | Admitting: Internal Medicine

## 2022-09-15 ENCOUNTER — Ambulatory Visit: Payer: Medicare Other | Attending: Internal Medicine | Admitting: Internal Medicine

## 2022-09-15 ENCOUNTER — Other Ambulatory Visit
Admission: RE | Admit: 2022-09-15 | Discharge: 2022-09-15 | Disposition: A | Discharge status: Home / Self Care | Payer: Medicare Other | Source: Ambulatory Visit | Attending: Internal Medicine | Admitting: Internal Medicine

## 2022-09-15 ENCOUNTER — Encounter: Payer: Self-pay | Admitting: Internal Medicine

## 2022-09-15 VITALS — BP 110/68 | HR 83 | Ht 72.0 in | Wt 182.0 lb

## 2022-09-15 DIAGNOSIS — I2510 Atherosclerotic heart disease of native coronary artery without angina pectoris: Secondary | ICD-10-CM | POA: Diagnosis not present

## 2022-09-15 DIAGNOSIS — E785 Hyperlipidemia, unspecified: Secondary | ICD-10-CM | POA: Diagnosis not present

## 2022-09-15 DIAGNOSIS — R5383 Other fatigue: Secondary | ICD-10-CM | POA: Diagnosis not present

## 2022-09-15 DIAGNOSIS — I5020 Unspecified systolic (congestive) heart failure: Secondary | ICD-10-CM | POA: Insufficient documentation

## 2022-09-15 LAB — COMPREHENSIVE METABOLIC PANEL
ALT: 21 U/L (ref 0–44)
AST: 29 U/L (ref 15–41)
Albumin: 3.8 g/dL (ref 3.5–5.0)
Alkaline Phosphatase: 90 U/L (ref 38–126)
Anion gap: 6 (ref 5–15)
BUN: 21 mg/dL (ref 8–23)
CO2: 26 mmol/L (ref 22–32)
Calcium: 9.2 mg/dL (ref 8.9–10.3)
Chloride: 107 mmol/L (ref 98–111)
Creatinine, Ser: 0.77 mg/dL (ref 0.61–1.24)
GFR, Estimated: >60 mL/min (ref >60)
Glucose, Bld: 104 mg/dL — ABNORMAL HIGH (ref 70–99)
Potassium: 4 mmol/L (ref 3.5–5.1)
Sodium: 139 mmol/L (ref 135–145)
Total Bilirubin: 0.7 mg/dL (ref 0.3–1.2)
Total Protein: 6.9 g/dL (ref 6.5–8.1)

## 2022-09-15 LAB — LIPID PANEL
Cholesterol: 112 mg/dL (ref 0–200)
HDL: 46 mg/dL (ref >40)
LDL Cholesterol: 51 mg/dL (ref 0–99)
Total CHOL/HDL Ratio: 2.4 RATIO
Triglycerides: 76 mg/dL (ref <150)
VLDL: 15 mg/dL (ref 0–40)

## 2022-09-15 LAB — CBC
HCT: 39.6 % (ref 39.0–52.0)
Hemoglobin: 13 g/dL (ref 13.0–17.0)
MCH: 29.9 pg (ref 26.0–34.0)
MCHC: 32.8 g/dL (ref 30.0–36.0)
MCV: 91 fL (ref 80.0–100.0)
Platelets: 195 10*3/uL (ref 150–400)
RBC: 4.35 MIL/uL (ref 4.22–5.81)
RDW: 13.5 % (ref 11.5–15.5)
WBC: 6.5 10*3/uL (ref 4.0–10.5)
nRBC: 0 % (ref 0.0–0.2)

## 2022-09-15 LAB — TSH: TSH: 1.391 u[IU]/mL (ref 0.350–4.500)

## 2022-09-15 MED ORDER — METOPROLOL SUCCINATE ER 25 MG PO TB24: 12.5000 mg | ORAL_TABLET | Freq: Every day | ORAL | 3 refills | Status: DC

## 2022-09-15 NOTE — Patient Instructions (Signed)
Medication Instructions:  Your physician recommends the following medication changes.  STOP TAKING: Spironolactone 25 mg  START TAKING: Metoprolol Succinate 12.5 mg by mouth daily  *If you need a refill on your cardiac medications before your next appointment, please call your pharmacy*   Lab Work: Your provider would like for you to have following labs drawn: (CBC, Lipid, CMP, TSH).   Please go to the [facility] entrance and check in at the front desk.  You do not need an appointment.  They are open from 7am-6 pm.   If you have labs (blood work) drawn today and your tests are completely normal, you will receive your results only by: [place] (if you have MyChart) OR A paper copy in the mail If you have any lab test that is abnormal or we need to change your treatment, we will call you to review the results.   Testing/Procedures: None ordered today   Follow-Up: At [facility], you and your health needs are our priority.  As part of our continuing mission to provide you with exceptional heart care, we have created designated Provider Care Teams.  These Care Teams include your primary Cardiologist (physician) and Advanced Practice Providers (APPs -  Physician Assistants and Nurse Practitioners) who all work together to provide you with the care you need, when you need it.  We recommend signing up for the patient portal called "MyChart".  Sign up information is provided on this After Visit Summary.  MyChart is used to connect with patients for Virtual Visits (Telemedicine).  Patients are able to view lab/test results, encounter notes, upcoming appointments, etc.  Non-urgent messages can be sent to your provider as well.   To learn more about what you can do with MyChart, go to [email or web address].    Your next appointment:   1 month(s)  Provider:   You may see Nelva Bush, MD or one of the following Advanced Practice Providers on your designated Care  Team:   Murray Hodgkins, NP Christell Faith, PA-C Cadence Kathlen Mody, PA-C Gerrie Nordmann, NP
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
## 2022-09-16 ENCOUNTER — Encounter: Payer: Self-pay | Admitting: Internal Medicine

## 2022-09-16 DIAGNOSIS — E785 Hyperlipidemia, unspecified: Secondary | ICD-10-CM | POA: Insufficient documentation

## 2022-09-16 DIAGNOSIS — R5383 Other fatigue: Secondary | ICD-10-CM | POA: Insufficient documentation

## 2022-09-22 ENCOUNTER — Telehealth: Payer: Self-pay | Admitting: Internal Medicine

## 2022-09-22 MED ORDER — CLOPIDOGREL BISULFATE 75 MG PO TABS: 75.0000 mg | ORAL_TABLET | Freq: Every day | ORAL | 0 refills | Status: DC

## 2022-09-22 NOTE — Telephone Encounter (Signed)
*  STAT* If patient is at the pharmacy, call can be transferred to refill team.   1. Which medications need to be refilled? (please list name of each medication and dose if known) Plavix 50m, Flomax, 0.469m 2. Which pharmacy/location (including street and city if local pharmacy) is medication to be sent to? [facility], mail order  3. Do they need a 30 day or 90 day supply? 90 day supply

## 2022-09-22 NOTE — Telephone Encounter (Signed)
Left voicemail message advising patient to contact PCP for refill of Flomax.

## 2022-09-22 NOTE — Telephone Encounter (Signed)
Requested Prescriptions   Signed Prescriptions Disp Refills   clopidogrel (PLAVIX) 75 MG tablet 90 tablet 0    Sig: Take 1 tablet (75 mg total) by mouth daily.    Authorizing Provider: END, CHRISTOPHER    Ordering User: Raelene Bott, Corwin Kuiken L

## 2022-10-14 ENCOUNTER — Other Ambulatory Visit: Payer: Self-pay | Admitting: Internal Medicine

## 2022-10-19 ENCOUNTER — Ambulatory Visit: Payer: Medicare Other | Attending: Medical | Admitting: Medical

## 2022-10-19 ENCOUNTER — Encounter: Payer: Self-pay | Admitting: Medical

## 2022-10-19 VITALS — BP 115/56 | HR 52 | Ht 73.0 in | Wt 183.0 lb

## 2022-10-19 DIAGNOSIS — I5022 Chronic systolic (congestive) heart failure: Secondary | ICD-10-CM

## 2022-10-19 DIAGNOSIS — I2510 Atherosclerotic heart disease of native coronary artery without angina pectoris: Secondary | ICD-10-CM | POA: Insufficient documentation

## 2022-10-19 DIAGNOSIS — I255 Ischemic cardiomyopathy: Secondary | ICD-10-CM | POA: Insufficient documentation

## 2022-10-19 DIAGNOSIS — E782 Mixed hyperlipidemia: Secondary | ICD-10-CM | POA: Diagnosis not present
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
## 2022-10-19 NOTE — Patient Instructions (Signed)
Medication Instructions:  Your physician recommends that you continue on your current medications as directed. Please refer to the Current Medication list given to you today.  *If you need a refill on your cardiac medications before your next appointment, please call your pharmacy*   Lab Work: No labs ordered  If you have labs (blood work) drawn today and your tests are completely normal, you will receive your results only by: [place] (if you have MyChart) OR A paper copy in the mail If you have any lab test that is abnormal or we need to change your treatment, we will call you to review the results.   Testing/Procedures: No testing ordered  Follow-Up: At [facility], you and your health needs are our priority.  As part of our continuing mission to provide you with exceptional heart care, we have created designated Provider Care Teams.  These Care Teams include your primary Cardiologist (physician) and Advanced Practice Providers (APPs -  Physician Assistants and Nurse Practitioners) who all work together to provide you with the care you need, when you need it.  We recommend signing up for the patient portal called "MyChart".  Sign up information is provided on this After Visit Summary.  MyChart is used to connect with patients for Virtual Visits (Telemedicine).  Patients are able to view lab/test results, encounter notes, upcoming appointments, etc.  Non-urgent messages can be sent to your provider as well.   To learn more about what you can do with MyChart, go to [email or web address].    Your next appointment:   2-3 month(s) (after 12/23/21)  Provider:   Nelva Bush, MD

## 2022-11-12 ENCOUNTER — Encounter: Payer: Medicare Other | Admitting: Internal Medicine

## 2022-11-15 ENCOUNTER — Ambulatory Visit (INDEPENDENT_AMBULATORY_CARE_PROVIDER_SITE_OTHER): Payer: Medicare Other | Admitting: Internal Medicine

## 2022-11-15 ENCOUNTER — Encounter: Payer: Self-pay | Admitting: Internal Medicine

## 2022-11-15 VITALS — BP 128/68 | HR 60 | Temp 97.4°F | Ht 70.0 in | Wt 183.0 lb

## 2022-11-15 DIAGNOSIS — M353 Polymyalgia rheumatica: Secondary | ICD-10-CM

## 2022-11-15 DIAGNOSIS — I5020 Unspecified systolic (congestive) heart failure: Secondary | ICD-10-CM

## 2022-11-15 DIAGNOSIS — D510 Vitamin B12 deficiency anemia due to intrinsic factor deficiency: Secondary | ICD-10-CM

## 2022-11-15 DIAGNOSIS — N401 Enlarged prostate with lower urinary tract symptoms: Secondary | ICD-10-CM

## 2022-11-15 DIAGNOSIS — I2510 Atherosclerotic heart disease of native coronary artery without angina pectoris: Secondary | ICD-10-CM

## 2022-11-15 DIAGNOSIS — N138 Other obstructive and reflux uropathy: Secondary | ICD-10-CM

## 2022-11-15 DIAGNOSIS — Z0000 Encounter for general adult medical examination without abnormal findings: Secondary | ICD-10-CM

## 2022-11-15 LAB — LIPID PANEL
Cholesterol: 109 mg/dL (ref 0–200)
HDL: 47.8 mg/dL (ref >39.00)
LDL Cholesterol: 49 mg/dL (ref 0–99)
NonHDL: 61.54
Total CHOL/HDL Ratio: 2
Triglycerides: 63 mg/dL (ref 0.0–149.0)
VLDL: 12.6 mg/dL (ref 0.0–40.0)

## 2022-11-15 LAB — CBC
HCT: 40.4 % (ref 39.0–52.0)
Hemoglobin: 13.6 g/dL (ref 13.0–17.0)
MCHC: 33.5 g/dL (ref 30.0–36.0)
MCV: 92.7 fl (ref 78.0–100.0)
Platelets: 178 10*3/uL (ref 150.0–400.0)
RBC: 4.36 Mil/uL (ref 4.22–5.81)
RDW: 14.3 % (ref 11.5–15.5)
WBC: 5.4 10*3/uL (ref 4.0–10.5)

## 2022-11-15 LAB — COMPREHENSIVE METABOLIC PANEL
ALT: 15 U/L (ref 0–53)
AST: 22 U/L (ref 0–37)
Albumin: 4.1 g/dL (ref 3.5–5.2)
Alkaline Phosphatase: 100 U/L (ref 39–117)
BUN: 20 mg/dL (ref 6–23)
CO2: 29 mEq/L (ref 19–32)
Calcium: 9.5 mg/dL (ref 8.4–10.5)
Chloride: 104 mEq/L (ref 96–112)
Creatinine, Ser: 0.74 mg/dL (ref 0.40–1.50)
GFR: 82.44 mL/min (ref >60.00)
Glucose, Bld: 91 mg/dL (ref 70–99)
Potassium: 4.7 mEq/L (ref 3.5–5.1)
Sodium: 141 mEq/L (ref 135–145)
Total Bilirubin: 0.7 mg/dL (ref 0.2–1.2)
Total Protein: 6.9 g/dL (ref 6.0–8.3)

## 2022-11-15 LAB — SEDIMENTATION RATE: Sed Rate: 8 mm/hr (ref 0–20)

## 2022-11-15 LAB — VITAMIN B12: Vitamin B-12: 888 pg/mL (ref 211–911)

## 2022-11-15 LAB — TSH: TSH: 1.57 u[IU]/mL (ref 0.35–5.50)

## 2022-11-15 NOTE — Assessment & Plan Note (Signed)
I have personally reviewed the Medicare Annual Wellness questionnaire and have noted 1. The patient's medical and social history 2. Their use of alcohol, tobacco or illicit drugs 3. Their current medications and supplements 4. The patient's functional ability including ADL's, fall risks, home safety risks and hearing or visual             impairment. 5. Diet and physical activities 6. Evidence for depression or mood disorders  The patients weight, height, BMI and visual acuity have been recorded in the chart I have made referrals, counseling and provided education to the patient based review of the above and I have provided the pt with a written personalized care plan for preventive services.  I have provided you with a copy of your personalized plan for preventive services. Please take the time to review along with your updated medication list.  Done with cancer screening Tries to do some exercise Will be due for flu/RSV in the fall Updated COVID vaccine soon

## 2022-11-15 NOTE — Progress Notes (Signed)
Vision Screening   Right eye Left eye Both eyes  Without correction 20/20 20/25 20/20   With correction     Hearing Screening - Comments:: Aware of hearing loss. Had hearing aids but states they did not work.

## 2022-11-15 NOTE — Progress Notes (Signed)
   Subjective:    Patient ID: [name], male    DOB: October 22, 1934, 88 y.o.   MRN: [id number]  HPI Here for Medicare wellness visit and follow up of chronic health conditions Reviewed advanced directives Reviewed other doctors---Dr End--cardiology, Dr Stoioff--urology Hospitalized with MI in May 2023 and hematuria in October Vision is okay--no recent eye exam (doesn't know who his eye doctor is) Hearing is very poor---aides didn't help No alcohol or tobacco Does try to do exercises---like stretching, etc No falls No depression or anhedonia Still does some yard work---able to help in house, but doesn't do much No sig memory issues  Heart is okay No chest pain or SOB Doesn't have much energy--but still tries to exercise 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 No dizziness or syncope No palpitations No edema  Hematuria did settle down Never did have cystoscopy under anaesthesia Nocturia x 3-4  Some urgency and frequency in the day  Has noted some swelling in scrotum No pain Some soreness in breasts--and mild enlargement  No sig joint or back pains  Review of Systems Appetite is fine Weight is stable Hands and feet stay cold Sleeps okay--other than the nocturia Wears seat belt Few teeth--doesn't see dentist No suspicious skin lesions--doesn't see derm No heartburn or dysphagia Bowels move okay--no blood No sig myalgias    Objective:   Physical Exam Constitutional:      Appearance: Normal appearance.  HENT:     Mouth/Throat:     Pharynx: No oropharyngeal exudate or posterior oropharyngeal erythema.  Eyes:     Conjunctiva/sclera: Conjunctivae normal.     Pupils: Pupils are equal, round, and reactive to light.  Cardiovascular:     Rate and Rhythm: Normal rate and regular rhythm.     Pulses: Normal pulses.     Heart sounds: No murmur heard.    No gallop.  Pulmonary:     Effort: Pulmonary effort is normal.     Breath sounds: Normal breath  sounds. No wheezing or rales.  Abdominal:     Palpations: Abdomen is soft.     Tenderness: There is no abdominal tenderness.  Genitourinary:    Comments: Mild gynecomastia without mass Right scrotum has small hydrocele and apparent epididymal cyst Musculoskeletal:     Cervical back: Neck supple.     Right lower leg: No edema.     Left lower leg: No edema.  Lymphadenopathy:     Cervical: No cervical adenopathy.  Skin:    Findings: No lesion or rash.  Neurological:     General: No focal deficit present.     Mental Status: He is alert and oriented to person, place, and time.     Comments: Word naming 10/1 minute Recall 2/3  Psychiatric:        Mood and Affect: Mood normal.        Behavior: Behavior normal.            Assessment & Plan:

## 2022-11-15 NOTE — Assessment & Plan Note (Signed)
Continues on the B12 

## 2022-11-15 NOTE — Patient Instructions (Signed)
I would recommend an updated COVID vaccine soon--at the pharmacy. Please get the flu and RSV vaccines in the fall (also at the pharmacy)

## 2022-11-15 NOTE — Assessment & Plan Note (Signed)
Lots of frequency and nocturia Follows with urology--on finasteride/tamsulosin No recurrence of hematuria

## 2022-11-15 NOTE — Assessment & Plan Note (Signed)
Seems to be in remission still Will check sed rate 

## 2022-11-15 NOTE — Assessment & Plan Note (Signed)
No fluid overload On the losartan/metoprolol

## 2022-11-15 NOTE — Assessment & Plan Note (Signed)
ST elevation MI last year No sig symptoms other than fatigue--on clopidogrel/ASA, atorvastatin 40, losartan 25, metoprolol 12.5

## 2022-12-16 ENCOUNTER — Other Ambulatory Visit: Payer: Self-pay | Admitting: Internal Medicine

## 2022-12-21 ENCOUNTER — Other Ambulatory Visit: Payer: Self-pay | Admitting: Internal Medicine

## 2023-01-07 ENCOUNTER — Ambulatory Visit: Payer: Medicare Other | Admitting: Internal Medicine

## 2023-01-10 ENCOUNTER — Other Ambulatory Visit: Payer: Self-pay | Admitting: Internal Medicine

## 2023-01-18 ENCOUNTER — Ambulatory Visit: Payer: Medicare Other | Attending: Internal Medicine | Admitting: Medical

## 2023-01-18 ENCOUNTER — Encounter: Payer: Self-pay | Admitting: Medical

## 2023-01-18 VITALS — BP 122/60 | HR 58 | Ht 72.0 in | Wt 182.6 lb

## 2023-01-18 DIAGNOSIS — I5022 Chronic systolic (congestive) heart failure: Secondary | ICD-10-CM | POA: Diagnosis not present

## 2023-01-18 DIAGNOSIS — I255 Ischemic cardiomyopathy: Secondary | ICD-10-CM | POA: Diagnosis not present

## 2023-01-18 DIAGNOSIS — I2510 Atherosclerotic heart disease of native coronary artery without angina pectoris: Secondary | ICD-10-CM | POA: Insufficient documentation

## 2023-01-18 DIAGNOSIS — E782 Mixed hyperlipidemia: Secondary | ICD-10-CM | POA: Diagnosis not present
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
## 2023-01-18 NOTE — Patient Instructions (Signed)
Medication Instructions:  Your Physician recommend you continue on your current medication as directed.    *If you need a refill on your cardiac medications before your next appointment, please call your pharmacy*   Lab Work: None ordered today   Testing/Procedures: None ordered today   Follow-Up: At [facility], you and your health needs are our priority.  As part of our continuing mission to provide you with exceptional heart care, we have created designated Provider Care Teams.  These Care Teams include your primary Cardiologist (physician) and Advanced Practice Providers (APPs -  Physician Assistants and Nurse Practitioners) who all work together to provide you with the care you need, when you need it.  We recommend signing up for the patient portal called "MyChart".  Sign up information is provided on this After Visit Summary.  MyChart is used to connect with patients for Virtual Visits (Telemedicine).  Patients are able to view lab/test results, encounter notes, upcoming appointments, etc.  Non-urgent messages can be sent to your provider as well.   To learn more about what you can do with MyChart, go to [email or web address].    Your next appointment:   6 month(s)  Provider:   Christopher End, MD     

## 2023-01-25 ENCOUNTER — Ambulatory Visit: Payer: Medicare Other | Admitting: Cardiology

## 2023-03-15 ENCOUNTER — Encounter: Payer: Self-pay | Admitting: Physician Assistant

## 2023-03-15 ENCOUNTER — Ambulatory Visit (INDEPENDENT_AMBULATORY_CARE_PROVIDER_SITE_OTHER): Payer: Medicare Other | Admitting: Physician Assistant

## 2023-03-15 VITALS — BP 113/69 | HR 51 | Ht 72.0 in | Wt 175.0 lb

## 2023-03-15 DIAGNOSIS — R310 Gross hematuria: Secondary | ICD-10-CM | POA: Diagnosis not present

## 2023-03-15 LAB — URINALYSIS, COMPLETE
Bilirubin, UA: NEGATIVE
Glucose, UA: NEGATIVE
Nitrite, UA: NEGATIVE
Specific Gravity, UA: >1.03 — ABNORMAL HIGH (ref 1.005–1.030)
Urobilinogen, Ur: 2 mg/dL — ABNORMAL HIGH (ref 0.2–1.0)
pH, UA: 5.5 (ref 5.0–7.5)

## 2023-03-15 LAB — MICROSCOPIC EXAMINATION: RBC, Urine: >30 /hpf — AB (ref 0–2)

## 2023-03-15 LAB — BLADDER SCAN AMB NON-IMAGING: Scan Result: 0

## 2023-03-15 MED ORDER — AMOXICILLIN-POT CLAVULANATE 875-125 MG PO TABS: 1.0000 | ORAL_TABLET | Freq: Two times a day (BID) | ORAL | 0 refills | Status: AC
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
## 2023-03-21 ENCOUNTER — Other Ambulatory Visit: Payer: Self-pay | Admitting: Internal Medicine

## 2023-03-25 ENCOUNTER — Other Ambulatory Visit: Payer: Self-pay | Admitting: *Deleted

## 2023-03-25 DIAGNOSIS — R310 Gross hematuria: Secondary | ICD-10-CM

## 2023-03-29 ENCOUNTER — Other Ambulatory Visit: Payer: Medicare Other

## 2023-03-29 ENCOUNTER — Telehealth: Payer: Self-pay

## 2023-03-29 DIAGNOSIS — R310 Gross hematuria: Secondary | ICD-10-CM

## 2023-03-29 LAB — MICROSCOPIC EXAMINATION
Epithelial Cells (non renal): >10 /hpf — AB (ref 0–10)
RBC, Urine: >30 /hpf — AB (ref 0–2)
WBC, UA: >30 /hpf — AB (ref 0–5)

## 2023-03-29 LAB — URINALYSIS, COMPLETE
Bilirubin, UA: NEGATIVE
Glucose, UA: NEGATIVE
Ketones, UA: NEGATIVE
Nitrite, UA: NEGATIVE
Specific Gravity, UA: >1.03 — ABNORMAL HIGH (ref 1.005–1.030)
Urobilinogen, Ur: 1 mg/dL (ref 0.2–1.0)
pH, UA: 5 (ref 5.0–7.5)

## 2023-03-29 NOTE — Telephone Encounter (Signed)
Pt LM on triage stating that he had missed a call from our office.   Called pt and read the information to him below from [facility], [place] :  Please let him know his repeat UA from this morning looks stable compared to prior. I'm repeating a urine culture today and will call him with his results. If his repeat culture is negative for infection, then we need to proceed with cystoscopy under anesthesia with bilateral retrograde pyelograms and possible biopsy/fulguration with Dr. Lonna Cobb as previously discussed.    Pt voiced understanding.

## 2023-03-29 NOTE — Progress Notes (Signed)
Please let him know his repeat UA from this morning looks stable compared to prior. I'm repeating a urine culture today and will call him with his results. If his repeat culture is negative for infection, then we need to proceed with cystoscopy under anesthesia with bilateral retrograde pyelograms and possible biopsy/fulguration with Dr. Lonna Cobb as previously discussed.

## 2023-04-05 ENCOUNTER — Other Ambulatory Visit: Payer: Self-pay | Admitting: Physician Assistant

## 2023-04-05 DIAGNOSIS — R310 Gross hematuria: Secondary | ICD-10-CM

## 2023-04-05 LAB — CULTURE, URINE COMPREHENSIVE

## 2023-04-05 NOTE — Progress Notes (Signed)
Please let the patient know that his repeat urine culture was negative for infection. At this point, I recommend that we proceed with cystoscopy under anesthesia with bilateral retrograde pyelograms and possible biopsy/fulguration with Dr. Lonna Cobb as previously discussed. I will put in the OR orders for this today and our scheduler, Melissa, will call him to get it booked.

## 2023-04-06 ENCOUNTER — Other Ambulatory Visit: Payer: Self-pay

## 2023-04-06 DIAGNOSIS — R310 Gross hematuria: Secondary | ICD-10-CM

## 2023-04-06 NOTE — Progress Notes (Signed)
Surgical Physician Order Form [facility]   Dr. Lonna Cobb * Scheduling expectation : Next Available  *Length of Case:   *Clearance needed: yes, cardiac  *Anticoagulation Instructions: N/A  *Aspirin Instructions: Hold Aspirin and Plavix  *Post-op visit Date/Instructions:  TBD  *Diagnosis: Gross hematuria  *Procedure: Cystoscopy with bilateral retrograde pyelograms and possible bladder vs ureteral biopsy vs fulguration   Additional orders: N/A  -Admit type: OUTpatient  -Anesthesia: General  -VTE Prophylaxis Standing Order SCD's       Other:   -Standing Lab Orders Per Anesthesia    Lab other: None  -Standing Test orders EKG/Chest x-ray per Anesthesia       Test other:   - Medications:  Ancef 2gm IV  -Other orders:  N/A

## 2023-04-07 ENCOUNTER — Telehealth: Payer: Self-pay

## 2023-04-07 NOTE — Telephone Encounter (Signed)
Scheduled for 09/17

## 2023-04-07 NOTE — Telephone Encounter (Signed)
Opened in Error.

## 2023-04-08 ENCOUNTER — Telehealth: Payer: Self-pay

## 2023-04-08 NOTE — Progress Notes (Signed)
  Phone Number: [phone] for Surgical Coordinator Fax Number: [phone]  REQUEST FOR SURGICAL CLEARANCE       Date: Date: 04/08/2023  Faxed to: Dr. Okey Dupre, [facility]  Surgeon: Dr. Irineo Axon, MD     Date of Surgery: 04/19/2023  Operation: Cystoscopy with Bilateral Retrograde Pyelograms, Possible Bladder Biopsy, Possible Ureteral Biopsy/Fulguration  Anesthesia Type: General   Diagnosis: Gross Hematuria  Patient Requires:   Cardiac / Vascular Clearance : Yes  Reason: Would like for patient to hold Plavix prior to surgery.   Risk Assessment:    Low   []       Moderate   []     High   []           This patient is optimized for surgery  YES []       NO   []    I recommend further assessment/workup prior to surgery. YES []      NO  []   Appointment scheduled for: _______________________   Further recommendations: ____________________________________     Physician Signature:__________________________________   Printed Name: ________________________________________   Date: _________________

## 2023-04-08 NOTE — Progress Notes (Signed)
   [facility] Surgical Posting Form  Surgery Date: Date: 04/19/2023  Surgeon: Dr. Irineo Axon, MD  Inpt ( No  )   Outpt (Yes)   Obs ( No  )   Diagnosis: R31.0 Gross Hematuria  -CPT: 74420, 41660, 52354, [phone]  Surgery: Cystoscopy with Bilateral Retrograde Pyelograms, Possible Bladder Biopsy, Possible Ureteral Biopsy/Fulguration  Stop Anticoagulations: Yes and will also hold Plavix  Cardiac/Medical/Pulmonary Clearance needed: yes  Clearance needed from Dr: Okey Dupre  Clearance request sent on: Date: 04/08/23  *Orders entered into EPIC  Date: 04/08/23   *Case booked in EPIC  Date: 04/07/2023  *Notified pt of Surgery: Date: 04/07/2023  PRE-OP UA & CX: no  *Placed into Prior Authorization Work Que Date: 04/08/23  Assistant/laser/rep:No

## 2023-04-08 NOTE — Telephone Encounter (Signed)
Per Dr. Lonna Cobb, Patient is to be scheduled for Cystoscopy with Bilateral Retrograde Pyelograms, Possible Bladder Biopsy, Possible Ureteral Biopsy/Fulguration  Mr. Doten was contacted and possible surgical dates were discussed, Tuesday September 17th, 2024 was agreed upon for surgery.    Patient was directed to call [phone] between 1-3pm the day before surgery to find out surgical arrival time.  Instructions were given not to eat or drink from midnight on the night before surgery and have a driver for the day of surgery. On the surgery day patient was instructed to enter through the [facility] entrance of [facility] report the Same Day Surgery desk.   Pre-Admit Testing will be in contact via phone to set up an interview with the anesthesia team to review your history and medications prior to surgery.   Reminder of this information was sent via MAILED to the patient.

## 2023-04-08 NOTE — Telephone Encounter (Signed)
   Pre-operative Risk Assessment    Patient Name: [name]  DOB: 1934-10-19 MRN: [id number]     Request for Surgical Clearance    Procedure:  Cystoscopy with Bilateral Retrograde Pyelograms, Possible Bladder Biopsy, Possible Ureteral Biopsy/Fulguration    Date of Surgery:  Clearance 04/19/23                                 Surgeon:  Dr. Irineo Axon Surgeon's Group or Practice Name:  [facility]  Phone number:  [phone]  Fax number:  [phone]    Type of Clearance Requested:   - Pharmacy:  Hold Clopidogrel (Plavix)     Type of Anesthesia:  General    Additional requests/questions:    Scarlette Shorts   04/08/2023, 5:12 PM

## 2023-04-11 ENCOUNTER — Telehealth: Payer: Self-pay

## 2023-04-11 NOTE — Telephone Encounter (Signed)
Pt is scheduled for 9/10 at 3pm. Ok'd by KL. Med rec and consent done.

## 2023-04-11 NOTE — Telephone Encounter (Signed)
   Name: [name]  DOB: May 17, 1935  MRN: [id number]  Primary Cardiologist: Yvonne Kendall, MD   Preoperative team, please contact this patient and set up a phone call appointment for further preoperative risk assessment. Please obtain consent and complete medication review. Thank you for your help.  I confirm that guidance regarding antiplatelet and oral anticoagulation therapy has been completed and, if necessary, noted below Per office protocol, if patient is without any new symptoms or concerns at the time of their virtual visit, he/she may hold Plavix for 5 days prior to procedure. Please resume Plavix as soon as possible postprocedure, at the discretion of the surgeon.     Joni Reining, NP 04/11/2023, 8:03 AM [facility]

## 2023-04-11 NOTE — Telephone Encounter (Signed)
Pt is scheduled for 9/10 at 3pm. Ok'd by KL. Med rec and consent done.    Patient Consent for Virtual Visit        [name] has provided verbal consent on 04/11/2023 for a virtual visit (video or telephone).   CONSENT FOR VIRTUAL VISIT FOR:  [name]  By participating in this virtual visit I agree to the following:  I hereby voluntarily request, consent and authorize [facility] and its employed or contracted physicians, physician assistants, nurse practitioners or other licensed health care professionals (the Practitioner), to provide me with telemedicine health care services (the "Services") as deemed necessary by the treating Practitioner. I acknowledge and consent to receive the Services by the Practitioner via telemedicine. I understand that the telemedicine visit will involve communicating with the Practitioner through live audiovisual communication technology and the disclosure of certain medical information by electronic transmission. I acknowledge that I have been given the opportunity to request an in-person assessment or other available alternative prior to the telemedicine visit and am voluntarily participating in the telemedicine visit.  I understand that I have the right to withhold or withdraw my consent to the use of telemedicine in the course of my care at any time, without affecting my right to future care or treatment, and that the Practitioner or I may terminate the telemedicine visit at any time. I understand that I have the right to inspect all information obtained and/or recorded in the course of the telemedicine visit and may receive copies of available information for a reasonable fee.  I understand that some of the potential risks of receiving the Services via telemedicine include:  Delay or interruption in medical evaluation due to technological equipment failure or disruption; Information transmitted may not be sufficient (e.g. poor resolution of  images) to allow for appropriate medical decision making by the Practitioner; and/or  In rare instances, security protocols could fail, causing a breach of personal health information.  Furthermore, I acknowledge that it is my responsibility to provide information about my medical history, conditions and care that is complete and accurate to the best of my ability. I acknowledge that Practitioner's advice, recommendations, and/or decision may be based on factors not within their control, such as incomplete or inaccurate data provided by me or distortions of diagnostic images or specimens that may result from electronic transmissions. I understand that the practice of medicine is not an exact science and that Practitioner makes no warranties or guarantees regarding treatment outcomes. I acknowledge that a copy of this consent can be made available to me via my patient portal [facility] MyChart), or I can request a printed copy by calling the office of [facility].    I understand that my insurance will be billed for this visit.   I have read or had this consent read to me. I understand the contents of this consent, which adequately explains the benefits and risks of the Services being provided via telemedicine.  I have been provided ample opportunity to ask questions regarding this consent and the Services and have had my questions answered to my satisfaction. I give my informed consent for the services to be provided through the use of telemedicine in my medical care

## 2023-04-12 ENCOUNTER — Ambulatory Visit: Payer: Medicare Other | Attending: Cardiology

## 2023-04-12 DIAGNOSIS — Z01810 Encounter for preprocedural cardiovascular examination: Secondary | ICD-10-CM | POA: Diagnosis not present

## 2023-04-12 DIAGNOSIS — Z01818 Encounter for other preprocedural examination: Secondary | ICD-10-CM
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
## 2023-04-13 ENCOUNTER — Encounter
Admission: RE | Admit: 2023-04-13 | Discharge: 2023-04-13 | Disposition: A | Discharge status: Home / Self Care | Payer: Medicare Other | Source: Ambulatory Visit | Attending: Urology | Admitting: Urology

## 2023-04-13 VITALS — Ht 72.0 in | Wt 175.0 lb

## 2023-04-13 DIAGNOSIS — I5020 Unspecified systolic (congestive) heart failure: Secondary | ICD-10-CM

## 2023-04-13 DIAGNOSIS — Z01812 Encounter for preprocedural laboratory examination: Secondary | ICD-10-CM

## 2023-04-13 DIAGNOSIS — D510 Vitamin B12 deficiency anemia due to intrinsic factor deficiency: Secondary | ICD-10-CM

## 2023-04-13 DIAGNOSIS — I2510 Atherosclerotic heart disease of native coronary artery without angina pectoris: Secondary | ICD-10-CM

## 2023-04-13 DIAGNOSIS — I10 Essential (primary) hypertension: Secondary | ICD-10-CM

## 2023-04-13 DIAGNOSIS — I255 Ischemic cardiomyopathy: Secondary | ICD-10-CM

## 2023-04-13 HISTORY — DX: Polymyalgia rheumatica: M35.3

## 2023-04-13 HISTORY — DX: Hyperlipidemia, unspecified: E78.5

## 2023-04-13 HISTORY — DX: Gross hematuria: R31.0

## 2023-04-13 NOTE — Patient Instructions (Signed)
Your procedure is scheduled on:04-19-23 Tuesday Report to the Registration Desk on the 1st floor of the [facility].Then proceed to the 2nd floor Surgery Desk To find out your arrival time, please call [phone] between 1PM - 3PM on:04-18-23 Monday If your arrival time is 6:00 am, do not arrive before that time as the Medical Mall entrance doors do not open until 6:00 am.  REMEMBER: Instructions that are not followed completely may result in serious medical risk, up to and including death; or upon the discretion of your surgeon and anesthesiologist your surgery may need to be rescheduled.  Do not eat food OR drink any liquids after midnight the night before surgery.  No gum chewing or hard candies  One week prior to surgery: Stop Anti-inflammatories (NSAIDS) such as Advil, Aleve, Ibuprofen, Motrin, Naproxen, Naprosyn and Aspirin based products such as Excedrin, Goody's Powder, BC Powder.You may however, take Tylenol if needed for pain up until the day of surgery. Stop ANY OVER THE COUNTER supplements/vitamins NOW (04-13-23) until after surgery (Multivitamin and Vitamin B12)   Continue taking all prescribed medications with the exception of the following: -clopidogrel (PLAVIX)-Stop 5 days prior to surgery-Last dose will be today 04-13-23 Wednesday  TAKE ONLY THESE MEDICATIONS THE MORNING OF SURGERY WITH A SIP OF WATER: -atorvastatin (LIPITOR)  -finasteride (PROSCAR)  -metoprolol succinate (TOPROL-XL)   No Alcohol for 24 hours before or after surgery.  No Smoking including e-cigarettes for 24 hours before surgery.  No chewable tobacco products for at least 6 hours before surgery.  No nicotine patches on the day of surgery.  Do not use any "recreational" drugs for at least a week (preferably 2 weeks) before your surgery.  Please be advised that the combination of cocaine and anesthesia may have negative outcomes, up to and including death. If you test positive for cocaine, your surgery  will be cancelled.  On the morning of surgery brush your teeth with toothpaste and water, you may rinse your mouth with mouthwash if you wish. Do not swallow any toothpaste or mouthwash.  Do not wear jewelry, make-up, hairpins, clips or nail polish.  For welded (permanent) jewelry: bracelets, anklets, waist bands, etc.  Please have this removed prior to surgery.  If it is not removed, there is a chance that hospital personnel will need to cut it off on the day of surgery.  Do not wear lotions, powders, or perfumes.   Do not shave body hair from the neck down 48 hours before surgery.  Contact lenses, hearing aids and dentures may not be worn into surgery.  Do not bring valuables to the hospital. [facility] is not responsible for any missing/lost belongings or valuables.   Notify your doctor if there is any change in your medical condition (cold, fever, infection).  Wear comfortable clothing (specific to your surgery type) to the hospital.  After surgery, you can help prevent lung complications by doing breathing exercises.  Take deep breaths and cough every 1-2 hours. Your doctor may order a device called an Incentive Spirometer to help you take deep breaths. When coughing or sneezing, hold a pillow firmly against your incision with both hands. This is called "splinting." Doing this helps protect your incision. It also decreases belly discomfort.  If you are being admitted to the hospital overnight, leave your suitcase in the car. After surgery it may be brought to your room.  In case of increased patient census, it may be necessary for you, the patient, to continue your postoperative  care in the Same Day Surgery department.  If you are being discharged the day of surgery, you will not be allowed to drive home. You will need a responsible individual to drive you home and stay with you for 24 hours after surgery.   If you are taking public transportation, you will need to have a  responsible individual with you.  Please call the Pre-admissions Testing Dept. at [phone] if you have any questions about these instructions.  Surgery Visitation Policy:  Patients having surgery or a procedure may have two visitors.  Children under the age of 54 must have an adult with them who is not the patient.

## 2023-04-14 ENCOUNTER — Encounter
Admission: RE | Admit: 2023-04-14 | Discharge: 2023-04-14 | Disposition: A | Discharge status: Home / Self Care | Payer: Medicare Other | Source: Ambulatory Visit | Attending: Urology | Admitting: Urology

## 2023-04-14 DIAGNOSIS — Z01818 Encounter for other preprocedural examination: Secondary | ICD-10-CM | POA: Diagnosis present

## 2023-04-14 DIAGNOSIS — D510 Vitamin B12 deficiency anemia due to intrinsic factor deficiency: Secondary | ICD-10-CM | POA: Diagnosis not present

## 2023-04-14 DIAGNOSIS — I5020 Unspecified systolic (congestive) heart failure: Secondary | ICD-10-CM | POA: Insufficient documentation

## 2023-04-14 DIAGNOSIS — I255 Ischemic cardiomyopathy: Secondary | ICD-10-CM | POA: Diagnosis not present

## 2023-04-14 DIAGNOSIS — I2510 Atherosclerotic heart disease of native coronary artery without angina pectoris: Secondary | ICD-10-CM | POA: Insufficient documentation

## 2023-04-14 DIAGNOSIS — Z01812 Encounter for preprocedural laboratory examination: Secondary | ICD-10-CM | POA: Diagnosis not present

## 2023-04-14 DIAGNOSIS — I110 Hypertensive heart disease with heart failure: Secondary | ICD-10-CM | POA: Insufficient documentation

## 2023-04-14 DIAGNOSIS — I10 Essential (primary) hypertension: Secondary | ICD-10-CM

## 2023-04-14 LAB — CBC
HCT: 37.5 % — ABNORMAL LOW (ref 39.0–52.0)
Hemoglobin: 12.6 g/dL — ABNORMAL LOW (ref 13.0–17.0)
MCH: 30.7 pg (ref 26.0–34.0)
MCHC: 33.6 g/dL (ref 30.0–36.0)
MCV: 91.5 fL (ref 80.0–100.0)
Platelets: 156 10*3/uL (ref 150–400)
RBC: 4.1 MIL/uL — ABNORMAL LOW (ref 4.22–5.81)
RDW: 13.6 % (ref 11.5–15.5)
WBC: 5.7 10*3/uL (ref 4.0–10.5)
nRBC: 0 % (ref 0.0–0.2)

## 2023-04-14 LAB — BASIC METABOLIC PANEL
Anion gap: 8 (ref 5–15)
BUN: 15 mg/dL (ref 8–23)
CO2: 24 mmol/L (ref 22–32)
Calcium: 8.9 mg/dL (ref 8.9–10.3)
Chloride: 107 mmol/L (ref 98–111)
Creatinine, Ser: 0.71 mg/dL (ref 0.61–1.24)
GFR, Estimated: >60 mL/min (ref >60)
Glucose, Bld: 94 mg/dL (ref 70–99)
Potassium: 3.7 mmol/L (ref 3.5–5.1)
Sodium: 139 mmol/L (ref 135–145)

## 2023-04-18 ENCOUNTER — Encounter: Payer: Self-pay | Admitting: Urology
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
## 2023-04-19 ENCOUNTER — Ambulatory Visit: Payer: Medicare Other | Admitting: Urgent Care

## 2023-04-19 ENCOUNTER — Other Ambulatory Visit: Payer: Self-pay | Admitting: Urology

## 2023-04-19 ENCOUNTER — Telehealth: Payer: Self-pay

## 2023-04-19 ENCOUNTER — Encounter: Payer: Self-pay | Admitting: Urology

## 2023-04-19 ENCOUNTER — Ambulatory Visit: Payer: Medicare Other

## 2023-04-19 ENCOUNTER — Other Ambulatory Visit: Payer: Self-pay

## 2023-04-19 ENCOUNTER — Encounter
Admission: RE | Disposition: A | Discharge status: Home / Self Care | Payer: Self-pay | Source: Home / Self Care | Attending: Urology

## 2023-04-19 ENCOUNTER — Ambulatory Visit
Admission: RE | Admit: 2023-04-19 | Discharge: 2023-04-19 | Disposition: A | Discharge status: Home / Self Care | Payer: Medicare Other | Attending: Urology | Admitting: Urology

## 2023-04-19 DIAGNOSIS — C679 Malignant neoplasm of bladder, unspecified: Secondary | ICD-10-CM | POA: Diagnosis not present

## 2023-04-19 DIAGNOSIS — M1990 Unspecified osteoarthritis, unspecified site: Secondary | ICD-10-CM | POA: Diagnosis not present

## 2023-04-19 DIAGNOSIS — M353 Polymyalgia rheumatica: Secondary | ICD-10-CM | POA: Insufficient documentation

## 2023-04-19 DIAGNOSIS — Z7902 Long term (current) use of antithrombotics/antiplatelets: Secondary | ICD-10-CM | POA: Diagnosis not present

## 2023-04-19 DIAGNOSIS — N400 Enlarged prostate without lower urinary tract symptoms: Secondary | ICD-10-CM | POA: Insufficient documentation

## 2023-04-19 DIAGNOSIS — C680 Malignant neoplasm of urethra: Secondary | ICD-10-CM

## 2023-04-19 DIAGNOSIS — D649 Anemia, unspecified: Secondary | ICD-10-CM | POA: Diagnosis not present

## 2023-04-19 DIAGNOSIS — R310 Gross hematuria: Secondary | ICD-10-CM | POA: Insufficient documentation

## 2023-04-19 DIAGNOSIS — I110 Hypertensive heart disease with heart failure: Secondary | ICD-10-CM | POA: Insufficient documentation

## 2023-04-19 DIAGNOSIS — I5022 Chronic systolic (congestive) heart failure: Secondary | ICD-10-CM | POA: Insufficient documentation

## 2023-04-19 DIAGNOSIS — N401 Enlarged prostate with lower urinary tract symptoms: Secondary | ICD-10-CM

## 2023-04-19 DIAGNOSIS — I252 Old myocardial infarction: Secondary | ICD-10-CM | POA: Diagnosis not present

## 2023-04-19 DIAGNOSIS — I2510 Atherosclerotic heart disease of native coronary artery without angina pectoris: Secondary | ICD-10-CM | POA: Diagnosis not present

## 2023-04-19 DIAGNOSIS — I255 Ischemic cardiomyopathy: Secondary | ICD-10-CM | POA: Diagnosis not present

## 2023-04-19 DIAGNOSIS — E785 Hyperlipidemia, unspecified: Secondary | ICD-10-CM | POA: Diagnosis not present

## 2023-04-19 DIAGNOSIS — Z86718 Personal history of other venous thrombosis and embolism: Secondary | ICD-10-CM | POA: Diagnosis not present

## 2023-04-19 DIAGNOSIS — N3001 Acute cystitis with hematuria: Secondary | ICD-10-CM

## 2023-04-19 HISTORY — DX: Malignant neoplasm of bladder, unspecified: C67.9

## 2023-04-19 HISTORY — DX: Unilateral inguinal hernia, without obstruction or gangrene, not specified as recurrent: K40.90

## 2023-04-19 HISTORY — PX: CYSTOSCOPY W/ RETROGRADES: SHX1426

## 2023-04-19 HISTORY — PX: CYSTOSCOPY WITH FULGERATION: SHX6638

## 2023-04-19 HISTORY — PX: CYSTOSCOPY WITH BIOPSY: SHX5122

## 2023-04-19 HISTORY — DX: Long term (current) use of antithrombotics/antiplatelets: Z79.02

## 2023-04-19 SURGERY — CYSTOSCOPY, WITH RETROGRADE PYELOGRAM
Anesthesia: General

## 2023-04-19 MED ORDER — ONDANSETRON HCL 4 MG/2ML IJ SOLN
INTRAMUSCULAR | Status: AC
  Filled 2023-04-19: qty 2

## 2023-04-19 MED ORDER — FENTANYL CITRATE (PF) 100 MCG/2ML IJ SOLN
INTRAMUSCULAR | Status: DC | PRN
  Administered 2023-04-19 (×2): 50 ug via INTRAVENOUS
  Administered 2023-04-19 (×2): 25 ug via INTRAVENOUS

## 2023-04-19 MED ORDER — OXYCODONE HCL 5 MG/5ML PO SOLN: 5.0000 mg | Freq: Once | ORAL | Status: AC | PRN

## 2023-04-19 MED ORDER — ONDANSETRON HCL 4 MG/2ML IJ SOLN: 4.0000 mg | Freq: Once | INTRAMUSCULAR | Status: DC | PRN

## 2023-04-19 MED ORDER — GLYCOPYRROLATE 0.2 MG/ML IJ SOLN
INTRAMUSCULAR | Status: AC
  Filled 2023-04-19: qty 1

## 2023-04-19 MED ORDER — FLUORESCEIN SODIUM 10 % IV SOLN
INTRAVENOUS | Status: AC
  Filled 2023-04-19: qty 5

## 2023-04-19 MED ORDER — ACETAMINOPHEN 10 MG/ML IV SOLN
INTRAVENOUS | Status: AC
  Filled 2023-04-19: qty 100

## 2023-04-19 MED ORDER — FLUORESCEIN SODIUM 10 % IV SOLN
INTRAVENOUS | Status: DC | PRN
Start: 2023-04-19 — End: 2023-04-19
  Administered 2023-04-19: 100 mg via INTRAVENOUS

## 2023-04-19 MED ORDER — CEFAZOLIN SODIUM-DEXTROSE 2-4 GM/100ML-% IV SOLN
2.0000 g | INTRAVENOUS | Status: AC
  Administered 2023-04-19: 2 g via INTRAVENOUS

## 2023-04-19 MED ORDER — FENTANYL CITRATE (PF) 100 MCG/2ML IJ SOLN
INTRAMUSCULAR | Status: AC
  Filled 2023-04-19: qty 2

## 2023-04-19 MED ORDER — IOHEXOL 180 MG/ML  SOLN
INTRAMUSCULAR | Status: DC | PRN
  Administered 2023-04-19 (×2): 10 mL

## 2023-04-19 MED ORDER — DEXAMETHASONE SODIUM PHOSPHATE 10 MG/ML IJ SOLN
INTRAMUSCULAR | Status: AC
  Filled 2023-04-19: qty 1

## 2023-04-19 MED ORDER — CHLORHEXIDINE GLUCONATE 0.12 % MT SOLN
OROMUCOSAL | Status: AC
  Filled 2023-04-19: qty 15

## 2023-04-19 MED ORDER — LIDOCAINE HCL (PF) 2 % IJ SOLN
INTRAMUSCULAR | Status: AC
  Filled 2023-04-19: qty 5

## 2023-04-19 MED ORDER — PROPOFOL 10 MG/ML IV BOLUS
INTRAVENOUS | Status: AC
  Filled 2023-04-19: qty 20

## 2023-04-19 MED ORDER — FENTANYL CITRATE (PF) 100 MCG/2ML IJ SOLN
25.0000 ug | INTRAMUSCULAR | Status: DC | PRN
  Administered 2023-04-19 (×2): 25 ug via INTRAVENOUS

## 2023-04-19 MED ORDER — GLYCOPYRROLATE 0.2 MG/ML IJ SOLN
INTRAMUSCULAR | Status: DC | PRN
  Administered 2023-04-19 (×2): .2 mg via INTRAVENOUS

## 2023-04-19 MED ORDER — OXYCODONE HCL 5 MG PO TABS
5.0000 mg | ORAL_TABLET | Freq: Once | ORAL | Status: AC | PRN
  Administered 2023-04-19: 5 mg via ORAL

## 2023-04-19 MED ORDER — ORAL CARE MOUTH RINSE: 15.0000 mL | Freq: Once | OROMUCOSAL | Status: AC

## 2023-04-19 MED ORDER — PROPOFOL 10 MG/ML IV BOLUS
INTRAVENOUS | Status: DC | PRN
  Administered 2023-04-19: 120 mg via INTRAVENOUS

## 2023-04-19 MED ORDER — CHLORHEXIDINE GLUCONATE 0.12 % MT SOLN
15.0000 mL | Freq: Once | OROMUCOSAL | Status: AC
  Administered 2023-04-19: 15 mL via OROMUCOSAL

## 2023-04-19 MED ORDER — ONDANSETRON HCL 4 MG/2ML IJ SOLN
INTRAMUSCULAR | Status: DC | PRN
  Administered 2023-04-19: 4 mg via INTRAVENOUS

## 2023-04-19 MED ORDER — CEFAZOLIN SODIUM-DEXTROSE 2-4 GM/100ML-% IV SOLN
INTRAVENOUS | Status: AC
  Filled 2023-04-19: qty 100

## 2023-04-19 MED ORDER — STERILE WATER FOR IRRIGATION IR SOLN
Status: DC | PRN
Start: 2023-04-19 — End: 2023-04-19
  Administered 2023-04-19 (×2): 3000 mL

## 2023-04-19 MED ORDER — LACTATED RINGERS IV SOLN: INTRAVENOUS | Status: DC

## 2023-04-19 MED ORDER — DEXAMETHASONE SODIUM PHOSPHATE 10 MG/ML IJ SOLN
INTRAMUSCULAR | Status: DC | PRN
  Administered 2023-04-19: 10 mg via INTRAVENOUS

## 2023-04-19 MED ORDER — FAMOTIDINE 20 MG PO TABS
ORAL_TABLET | ORAL | Status: AC
  Filled 2023-04-19: qty 1

## 2023-04-19 MED ORDER — LIDOCAINE HCL (CARDIAC) PF 100 MG/5ML IV SOSY
PREFILLED_SYRINGE | INTRAVENOUS | Status: DC | PRN
  Administered 2023-04-19: 100 mg via INTRAVENOUS

## 2023-04-19 MED ORDER — OXYCODONE HCL 5 MG PO TABS
ORAL_TABLET | ORAL | Status: AC
  Filled 2023-04-19: qty 1

## 2023-04-19 MED ORDER — FAMOTIDINE 20 MG PO TABS
20.0000 mg | ORAL_TABLET | Freq: Once | ORAL | Status: AC
  Administered 2023-04-19: 20 mg via ORAL

## 2023-04-19 MED ORDER — HYDROCODONE-ACETAMINOPHEN 5-325 MG PO TABS
1.0000 | ORAL_TABLET | Freq: Three times a day (TID) | ORAL | 0 refills | Status: DC | PRN
Start: 2023-04-19 — End: 2023-04-19

## 2023-04-19 MED ORDER — ACETAMINOPHEN 10 MG/ML IV SOLN
1000.0000 mg | Freq: Once | INTRAVENOUS | Status: DC | PRN
  Administered 2023-04-19: 1000 mg via INTRAVENOUS

## 2023-04-19 MED ORDER — SODIUM CHLORIDE 0.9 % IR SOLN
Status: DC | PRN
  Administered 2023-04-19 (×2): 3000 mL

## 2023-04-19 MED ORDER — OXYCODONE-ACETAMINOPHEN 5-325 MG PO TABS: 1.0000 | ORAL_TABLET | ORAL | 0 refills | Status: DC | PRN

## 2023-04-19 SURGICAL SUPPLY — 29 items
BAG DRAIN SIEMENS DORNER NS (MISCELLANEOUS) ×2 IMPLANT
BAG DRN NS LF (MISCELLANEOUS) ×2
BRUSH SCRUB EZ 4% CHG (MISCELLANEOUS) ×2 IMPLANT
CATH FOL 3WAY LX 20X30 (CATHETERS) IMPLANT
CATH URET FLEX-TIP 2 LUMEN 10F (CATHETERS) ×2 IMPLANT
CATH URETL OPEN END 6X70 (CATHETERS) ×2 IMPLANT
DRAPE UTILITY 15X26 TOWEL STRL (DRAPES) ×2 IMPLANT
DRSG TELFA 3X4 N-ADH STERILE (GAUZE/BANDAGES/DRESSINGS) ×2 IMPLANT
ELECT REM PT RETURN 9FT ADLT (ELECTROSURGICAL) ×2
ELECTRODE REM PT RTRN 9FT ADLT (ELECTROSURGICAL) ×2 IMPLANT
FORCEPS BIOP PIRANHA Y (CUTTING FORCEPS) IMPLANT
GLOVE BIOGEL PI IND STRL 7.5 (GLOVE) ×2 IMPLANT
GOWN STRL REUS W/ TWL LRG LVL3 (GOWN DISPOSABLE) ×4 IMPLANT
GOWN STRL REUS W/ TWL XL LVL3 (GOWN DISPOSABLE) ×2 IMPLANT
GOWN STRL REUS W/TWL LRG LVL3 (GOWN DISPOSABLE) ×4
GOWN STRL REUS W/TWL XL LVL3 (GOWN DISPOSABLE) ×2
GUIDEWIRE STR DUAL SENSOR (WIRE) ×4 IMPLANT
IV NS IRRIG 3000ML ARTHROMATIC (IV SOLUTION) ×2 IMPLANT
KIT TURNOVER CYSTO (KITS) ×2 IMPLANT
LOOP CUT BIPOLAR 24F LRG (ELECTROSURGICAL) IMPLANT
NDL SAFETY ECLIP 18X1.5 (MISCELLANEOUS) ×2 IMPLANT
PACK CYSTO AR (MISCELLANEOUS) ×2 IMPLANT
PLUG CATH AND CAP STER (CATHETERS) IMPLANT
SET CYSTO W/LG BORE CLAMP LF (SET/KITS/TRAYS/PACK) ×2 IMPLANT
SET IRRIG Y TYPE TUR BLADDER L (SET/KITS/TRAYS/PACK) IMPLANT
SHEATH NAVIGATOR HD 12/14X36 (SHEATH) ×2 IMPLANT
SURGILUBE 2OZ TUBE FLIPTOP (MISCELLANEOUS) ×2 IMPLANT
WATER STERILE IRR 3000ML UROMA (IV SOLUTION) ×2 IMPLANT
WATER STERILE IRR 500ML POUR (IV SOLUTION) ×2 IMPLANT
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
## 2023-04-19 NOTE — Progress Notes (Signed)
Patient was prescribed Norco 5/325, but [facility] was unable to get this medication.  We sent in Percocet 5/325 in its place.
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
## 2023-04-19 NOTE — Anesthesia Procedure Notes (Signed)
Procedure Name: LMA Insertion Date/Time: 04/19/2023 8:40 AM  Performed by: Lily Lovings, CRNAPre-anesthesia Checklist: Patient identified, Patient being monitored, Timeout performed, Emergency Drugs available and Suction available Patient Re-evaluated:Patient Re-evaluated prior to induction Oxygen Delivery Method: Circle system utilized Preoxygenation: Pre-oxygenation with 100% oxygen Induction Type: IV induction Ventilation: Mask ventilation without difficulty LMA: LMA inserted LMA Size: 4.0 Tube type: Oral Number of attempts: 1 Placement Confirmation: positive ETCO2 and breath sounds checked- equal and bilateral Tube secured with: Tape Dental Injury: Teeth and Oropharynx as per pre-operative assessment

## 2023-04-19 NOTE — Telephone Encounter (Signed)
Call left on triage line  Pts wife states pt had surgery today with [facility]. She went to the pharmacy to pick up and the do not have the med. She also stated that he needs an appt in the a.m.  Called [facility] Norco 5/325 is on back order.  SM sent in Percocet 5mg .   Per [facility] pt needs to be seen in the am.  LM on pts vm that med was erxed and appt made for tomorrow at 945.

## 2023-04-19 NOTE — Discharge Instructions (Signed)

## 2023-04-19 NOTE — Interval H&P Note (Signed)
History and Physical Interval Note:  04/19/2023 8:30 AM  [name]  has presented today for surgery, with the diagnosis of Gross Hematuria.  The various methods of treatment have been discussed with the patient and family. After consideration of risks, benefits and other options for treatment, the patient has consented to  Procedure(s): CYSTOSCOPY WITH RETROGRADE PYELOGRAM (Bilateral) CYSTOSCOPY WITH BLADDER BIOPSY (N/A) URETERAL BIOPSY (Bilateral) CYSTOSCOPY WITH FULGERATION (N/A) as a surgical intervention.  The patient's history has been reviewed, patient examined, no change in status, stable for surgery.  I have reviewed the patient's chart and labs.  Questions were answered to the patient's satisfaction.     Verona Hartshorn C Vanna Sailer

## 2023-04-19 NOTE — Transfer of Care (Signed)
Immediate Anesthesia Transfer of Care Note  Patient: [name]  Procedure(s) Performed: CYSTOSCOPY WITH RETROGRADE PYELOGRAM (Bilateral) CYSTOSCOPY WITH PROSTATIC URETHRAL BIOPSY CYSTOSCOPY WITH FULGERATION  Patient Location: PACU  Anesthesia Type:General  Level of Consciousness: drowsy and patient cooperative  Airway & Oxygen Therapy: Patient Spontanous Breathing and Patient connected to nasal cannula oxygen  Post-op Assessment: Report given to RN and Patient moving all extremities X 4  Post vital signs: Reviewed and stable  Last Vitals:  Vitals Value Taken Time  BP 174/79 04/19/23 1010  Temp 36.1 C 04/19/23 1010  Pulse 60 04/19/23 1010  Resp 12 04/19/23 1010  SpO2 99 % 04/19/23 1010  Vitals shown include unfiled device data.  Last Pain:  Vitals:   04/19/23 0742  TempSrc: Temporal  PainSc: 0-No pain         Complications: No notable events documented.

## 2023-04-19 NOTE — Op Note (Signed)
   Preoperative diagnosis:  Gross hematuria-recurrent  Postoperative diagnosis:  Gross hematuria-recurrent 2.  Abnormal tissue prostatic urethra  Procedure: Cystoscopy with prostatic urethral biopsies Bilateral retrograde pyelograms  Surgeon: Riki Altes, MD  Anesthesia: General  Complications: None  Intraoperative findings:  Cystoscopy-urethra normal encounter without stricture; prominent lateral lobe enlargement with hypervascularity/friability.  Shaggy, whitish tissue proximal prostatic urethra near bladder neck primarily lower portion of left lateral lobe and floor; UOs with clear efflux; bladder mucosa without solid or papillary lesions.  Some hyperemia but no mucosal erythema Right retrograde pyelogram-ureter normal in appearance; no dilation, narrowing or filling defect.  Collecting system without dilation or filling defect Left retrograde pyelogram-ureter normal in appearance; no dilation, narrowing or filling defect.  Air bubbles initially proximal ureter but noted on real-time fluoroscopy to drain.  Collecting system normal in appearance   EBL: Minimal  Specimens: Transurethral prostatic biopsies  Indication: [name] is a 88 y.o. male with recurrent gross hematuria.  Prior upper tract imaging without abnormalities.  He presents for cystoscopy under anesthesia, bilateral retrograde pyelograms. After reviewing the management options for treatment, he elected to proceed with the above surgical procedure(s). We have discussed the potential benefits and risks of the procedure, side effects of the proposed treatment, the likelihood of the patient achieving the goals of the procedure, and any potential problems that might occur during the procedure or recuperation. Informed consent has been obtained.  Description of procedure:  The patient was taken to the operating room and general anesthesia was induced.  The patient was placed in the dorsal lithotomy position, prepped  and draped in the usual sterile fashion, and preoperative antibiotics were administered. A preoperative time-out was performed.   A 21 French cystoscope was lubricated, placed per urethra and advanced proximally under direct vision with findings as described above.  A 6 French open ended ureteral catheter was placed into the right UO and retrograde pyelogram was performed with findings as described above.  The left UO could not be identified secondary to backbleeding from the prostate.  Patient received 100 mg of fluorescein IV.  The cystoscope was removed and replaced with a 24 French continuous-flow resectoscope sheath.  The left UO was notified with the addition of fluorescein.  It was cannulated with a guidewire and the 6 [place] open-ended catheter was placed into the distal ureter and retrograde pyelogram performed with findings as described above.  The resectoscope laser bridge was replaced with a resectoscope/loop and prostatic urethral biopsies were taken of the abnormal appearing tissue.  Hemostasis was obtained with cautery.  The resectoscope was removed and a 20 French Foley catheter was placed.  There was moderate hematuria noted on irrigation and the 20 [place] Foley was removed and replaced with a 20 [place] three-way Foley catheter.  The catheter was placed to CBI with clear effluent on moderate flow.  After anesthetic reversal he was transported to the PACU in stable condition.  Plan: CBI inflow will be decreased.  If no significant hematuria noted the inflow port will be capped and will discharge home with an indwelling Foley x 24 hours   Riki Altes, M.D.

## 2023-04-20 ENCOUNTER — Ambulatory Visit (INDEPENDENT_AMBULATORY_CARE_PROVIDER_SITE_OTHER): Payer: Medicare Other | Admitting: Urology

## 2023-04-20 ENCOUNTER — Encounter: Payer: Self-pay | Admitting: Urology

## 2023-04-20 VITALS — BP 120/67 | HR 58

## 2023-04-20 DIAGNOSIS — R310 Gross hematuria: Secondary | ICD-10-CM

## 2023-04-20 NOTE — Progress Notes (Signed)
Catheter Removal  Patient is present today for a catheter removal. 20ml of water was drained from the balloon. A 20FR 3 way foley cath was removed from the bladder, no complications were noted. Patient tolerated well.  Performed by: Debbe Bales, CMA (AAMA)  Follow up/ Additional notes:  RTC in 3 months for f/u per Dr. Lonna Cobb

## 2023-04-20 NOTE — Patient Instructions (Signed)
It will be normal to see blood in your urine for the next 2 weeks, it is important to drink lots of fluids to keep your urine as clear as possible. We will call you with your pathology results. Please call the office if you are still having blood in your urine greater than 2 weeks.

## 2023-04-20 NOTE — Anesthesia Postprocedure Evaluation (Signed)
Anesthesia Post Note  Patient: [name]  Procedure(s) Performed: CYSTOSCOPY WITH RETROGRADE PYELOGRAM (Bilateral) CYSTOSCOPY WITH PROSTATIC URETHRAL BIOPSY CYSTOSCOPY WITH FULGERATION  Patient location during evaluation: PACU Anesthesia Type: General Level of consciousness: awake and alert Pain management: pain level controlled Vital Signs Assessment: post-procedure vital signs reviewed and stable Respiratory status: spontaneous breathing, nonlabored ventilation, respiratory function stable and patient connected to nasal cannula oxygen Cardiovascular status: blood pressure returned to baseline and stable Postop Assessment: no apparent nausea or vomiting Anesthetic complications: no   No notable events documented.   Last Vitals:  Vitals:   04/19/23 1135 04/19/23 1137  BP: (!) 175/63 (!) 159/84  Pulse: (!) 48 (!) 50  Resp: 18   Temp: 36.6 C   SpO2: 94%     Last Pain:  Vitals:   04/19/23 1137  TempSrc:   PainSc: 2                  Corinda Gubler

## 2023-04-21 LAB — SURGICAL PATHOLOGY

## 2023-04-22 ENCOUNTER — Telehealth: Payer: Self-pay | Admitting: Urology

## 2023-04-22 NOTE — Telephone Encounter (Signed)
I contacted [name] to discuss his pathology report.  He underwent cystoscopy under anesthesia and bilateral retrograde pyelograms for evaluation of recurrent gross hematuria.  No upper tract abnormalities on retrogrades.  No bladder tumors or mucosal abnormalities identified.  There was shaggy abnormal appearing tissue in the prostatic urethra at the bladder neck which was resected and returned positive for high-grade urothelial carcinoma with muscle invasion.  We discussed the standard treatment option for muscle invasive urothelial carcinoma is radical cystoprostatectomy however based on age and significant comorbidities do not feel he is a candidate.  We discussed referral to oncology for consideration of radiation/chemotherapy.  He does have fairly significant prostate enlargement and may need an outlet procedure prior to radiation.

## 2023-04-25 ENCOUNTER — Other Ambulatory Visit: Payer: Self-pay | Admitting: *Deleted

## 2023-04-25 MED ORDER — LOSARTAN POTASSIUM 25 MG PO TABS: 25.0000 mg | ORAL_TABLET | Freq: Every day | ORAL | 0 refills | Status: DC

## 2023-04-25 MED ORDER — ATORVASTATIN CALCIUM 40 MG PO TABS: 40.0000 mg | ORAL_TABLET | Freq: Every day | ORAL | 0 refills | Status: DC

## 2023-04-28 ENCOUNTER — Other Ambulatory Visit: Payer: Self-pay | Admitting: Urology

## 2023-04-28 DIAGNOSIS — C679 Malignant neoplasm of bladder, unspecified: Secondary | ICD-10-CM

## 2023-05-03 ENCOUNTER — Encounter: Payer: Self-pay | Admitting: Oncology

## 2023-05-03 ENCOUNTER — Other Ambulatory Visit: Payer: Medicare Other

## 2023-05-03 ENCOUNTER — Inpatient Hospital Stay: Payer: Medicare Other | Attending: Oncology | Admitting: Oncology

## 2023-05-03 VITALS — BP 112/69 | HR 87 | Temp 96.8°F | Resp 18 | Wt 181.3 lb

## 2023-05-03 DIAGNOSIS — D510 Vitamin B12 deficiency anemia due to intrinsic factor deficiency: Secondary | ICD-10-CM | POA: Diagnosis not present

## 2023-05-03 DIAGNOSIS — C675 Malignant neoplasm of bladder neck: Secondary | ICD-10-CM | POA: Insufficient documentation

## 2023-05-03 DIAGNOSIS — C689 Malignant neoplasm of urinary organ, unspecified: Secondary | ICD-10-CM

## 2023-05-03 DIAGNOSIS — Z7189 Other specified counseling: Secondary | ICD-10-CM | POA: Insufficient documentation

## 2023-05-03 DIAGNOSIS — C679 Malignant neoplasm of bladder, unspecified: Secondary | ICD-10-CM

## 2023-05-03 LAB — CBC WITH DIFFERENTIAL (CANCER CENTER ONLY)
Abs Immature Granulocytes: 0.09 10*3/uL — ABNORMAL HIGH (ref 0.00–0.07)
Basophils Absolute: 0.1 10*3/uL (ref 0.0–0.1)
Basophils Relative: 1 %
Eosinophils Absolute: 0.1 10*3/uL (ref 0.0–0.5)
Eosinophils Relative: 2 %
HCT: 42.1 % (ref 39.0–52.0)
Hemoglobin: 13.9 g/dL (ref 13.0–17.0)
Immature Granulocytes: 1 %
Lymphocytes Relative: 24 %
Lymphs Abs: 1.8 10*3/uL (ref 0.7–4.0)
MCH: 30.5 pg (ref 26.0–34.0)
MCHC: 33 g/dL (ref 30.0–36.0)
MCV: 92.5 fL (ref 80.0–100.0)
Monocytes Absolute: 0.6 10*3/uL (ref 0.1–1.0)
Monocytes Relative: 9 %
Neutro Abs: 4.8 10*3/uL (ref 1.7–7.7)
Neutrophils Relative %: 63 %
Platelet Count: 201 10*3/uL (ref 150–400)
RBC: 4.55 MIL/uL (ref 4.22–5.81)
RDW: 13.2 % (ref 11.5–15.5)
WBC Count: 7.5 10*3/uL (ref 4.0–10.5)
nRBC: 0 % (ref 0.0–0.2)

## 2023-05-03 LAB — CMP (CANCER CENTER ONLY)
ALT: 17 U/L (ref 0–44)
AST: 26 U/L (ref 15–41)
Albumin: 3.9 g/dL (ref 3.5–5.0)
Alkaline Phosphatase: 110 U/L (ref 38–126)
Anion gap: 9 (ref 5–15)
BUN: 21 mg/dL (ref 8–23)
CO2: 24 mmol/L (ref 22–32)
Calcium: 9.5 mg/dL (ref 8.9–10.3)
Chloride: 104 mmol/L (ref 98–111)
Creatinine: 0.81 mg/dL (ref 0.61–1.24)
GFR, Estimated: >60 mL/min (ref >60)
Glucose, Bld: 121 mg/dL — ABNORMAL HIGH (ref 70–99)
Potassium: 4 mmol/L (ref 3.5–5.1)
Sodium: 137 mmol/L (ref 135–145)
Total Bilirubin: 0.5 mg/dL (ref 0.3–1.2)
Total Protein: 7.6 g/dL (ref 6.5–8.1)

## 2023-05-03 LAB — IRON AND TIBC
Iron: 58 ug/dL (ref 45–182)
Saturation Ratios: 16 % — ABNORMAL LOW (ref 17.9–39.5)
TIBC: 357 ug/dL (ref 250–450)
UIBC: 299 ug/dL

## 2023-05-03 LAB — FERRITIN: Ferritin: 29 ng/mL (ref 24–336)

## 2023-05-03 LAB — RETIC PANEL
Immature Retic Fract: 6.3 % (ref 2.3–15.9)
RBC.: 4.59 MIL/uL (ref 4.22–5.81)
Retic Count, Absolute: 49.1 10*3/uL (ref 19.0–186.0)
Retic Ct Pct: 1.1 % (ref 0.4–3.1)
Reticulocyte Hemoglobin: 32.3 pg (ref >27.9)

## 2023-05-03 LAB — VITAMIN B12: Vitamin B-12: 1257 pg/mL — ABNORMAL HIGH (ref 180–914)

## 2023-05-03 LAB — FOLATE: Folate: >40 ng/mL (ref >5.9)

## 2023-05-03 MED ORDER — ONDANSETRON HCL 8 MG PO TABS: 8.0000 mg | ORAL_TABLET | Freq: Three times a day (TID) | ORAL | 1 refills | Status: DC | PRN

## 2023-05-03 MED ORDER — PROCHLORPERAZINE MALEATE 10 MG PO TABS: 10.0000 mg | ORAL_TABLET | Freq: Four times a day (QID) | ORAL | 1 refills | Status: DC | PRN

## 2023-05-03 NOTE — Assessment & Plan Note (Signed)
Discussed with patient

## 2023-05-03 NOTE — Assessment & Plan Note (Addendum)
Urothelial carcinoma of prostatic urethra/bladder neck, cT2, muscle invasive.  He is not a candidate for radical cystectomy.  Recommend concurrent chemotherapy with radiation. Obtain CT chest to complete staging.Marland Kitchen  He has severe hearing loss of right ear, and moderate hearing loss of left ear. Cisplatin is not an ideal chemotherapy choice.  Recommend bi weekly Gemcitabine 27mg /m2 concurrently with radiation.  He will establish care with Dr. Rushie Chestnut next week.  Discussed with Urology Dr. Lonna Cobb, he will have outlet procedure done prior to chemoradiation to decrease urinary retention, and decrease post op incontinence.

## 2023-05-03 NOTE — Progress Notes (Signed)
START ON PATHWAY REGIMEN - Bladder   Gemcitabine 27 mg/m2 IV Twice Weekly + RT x 4 Weeks (Induction):   One induction cycle is 4 weeks:     Gemcitabine    Gemcitabine 27 mg/m2 IV Twice Weekly + RT x 2.5 Weeks (Consolidation):   One consolidation cycle is 2.5 weeks:     Gemcitabine   **Always confirm dose/schedule in your pharmacy ordering system**  Patient Characteristics: Pre-Cystectomy or Nonsurgical Candidate, M0 (Clinical Staging), cT2-4a, cN0-1, M0, Bladder-Sparing Approach Therapeutic Status: Pre-Cystectomy or Nonsurgical Candidate, M0 (Clinical Staging) AJCC M Category: cM0 AJCC 8 Stage Grouping: II AJCC T Category: cT2 AJCC N Category: cN0 Intent of Therapy: Curative Intent, Discussed with Patient 
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
## 2023-05-04 ENCOUNTER — Other Ambulatory Visit: Payer: Self-pay

## 2023-05-05 DIAGNOSIS — Z23 Encounter for immunization: Secondary | ICD-10-CM | POA: Diagnosis not present

## 2023-05-06 ENCOUNTER — Inpatient Hospital Stay: Payer: Medicare Other

## 2023-05-09 ENCOUNTER — Ambulatory Visit
Admission: RE | Admit: 2023-05-09 | Discharge: 2023-05-09 | Disposition: A | Discharge status: Home / Self Care | Payer: Medicare Other | Source: Ambulatory Visit | Attending: Radiation Oncology | Admitting: Radiation Oncology

## 2023-05-09 VITALS — BP 119/67 | HR 56 | Temp 97.0°F | Resp 16 | Ht 72.0 in | Wt 183.0 lb

## 2023-05-09 DIAGNOSIS — Z86718 Personal history of other venous thrombosis and embolism: Secondary | ICD-10-CM | POA: Insufficient documentation

## 2023-05-09 DIAGNOSIS — Z7902 Long term (current) use of antithrombotics/antiplatelets: Secondary | ICD-10-CM | POA: Diagnosis not present

## 2023-05-09 DIAGNOSIS — N200 Calculus of kidney: Secondary | ICD-10-CM | POA: Diagnosis not present

## 2023-05-09 DIAGNOSIS — M1990 Unspecified osteoarthritis, unspecified site: Secondary | ICD-10-CM | POA: Insufficient documentation

## 2023-05-09 DIAGNOSIS — I5022 Chronic systolic (congestive) heart failure: Secondary | ICD-10-CM | POA: Insufficient documentation

## 2023-05-09 DIAGNOSIS — Z79899 Other long term (current) drug therapy: Secondary | ICD-10-CM | POA: Diagnosis not present

## 2023-05-09 DIAGNOSIS — I252 Old myocardial infarction: Secondary | ICD-10-CM | POA: Diagnosis not present

## 2023-05-09 DIAGNOSIS — M353 Polymyalgia rheumatica: Secondary | ICD-10-CM | POA: Insufficient documentation

## 2023-05-09 DIAGNOSIS — I255 Ischemic cardiomyopathy: Secondary | ICD-10-CM | POA: Insufficient documentation

## 2023-05-09 DIAGNOSIS — I110 Hypertensive heart disease with heart failure: Secondary | ICD-10-CM | POA: Diagnosis not present

## 2023-05-09 DIAGNOSIS — Z809 Family history of malignant neoplasm, unspecified: Secondary | ICD-10-CM | POA: Diagnosis not present

## 2023-05-09 DIAGNOSIS — N400 Enlarged prostate without lower urinary tract symptoms: Secondary | ICD-10-CM | POA: Insufficient documentation

## 2023-05-09 DIAGNOSIS — C679 Malignant neoplasm of bladder, unspecified: Secondary | ICD-10-CM | POA: Insufficient documentation

## 2023-05-09 DIAGNOSIS — I2510 Atherosclerotic heart disease of native coronary artery without angina pectoris: Secondary | ICD-10-CM | POA: Insufficient documentation

## 2023-05-09 DIAGNOSIS — E785 Hyperlipidemia, unspecified: Secondary | ICD-10-CM | POA: Insufficient documentation
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
## 2023-05-10 ENCOUNTER — Ambulatory Visit
Admission: RE | Admit: 2023-05-10 | Discharge: 2023-05-10 | Disposition: A | Discharge status: Home / Self Care | Payer: Medicare Other | Source: Ambulatory Visit | Attending: Oncology | Admitting: Oncology

## 2023-05-10 ENCOUNTER — Other Ambulatory Visit: Payer: Self-pay | Admitting: Radiation Oncology

## 2023-05-10 DIAGNOSIS — I700 Atherosclerosis of aorta: Secondary | ICD-10-CM | POA: Diagnosis not present

## 2023-05-10 DIAGNOSIS — C679 Malignant neoplasm of bladder, unspecified: Secondary | ICD-10-CM | POA: Insufficient documentation

## 2023-05-10 MED ORDER — IOHEXOL 300 MG/ML  SOLN
75.0000 mL | Freq: Once | INTRAMUSCULAR | Status: AC | PRN
  Administered 2023-05-10: 75 mL via INTRAVENOUS

## 2023-05-11 ENCOUNTER — Telehealth: Payer: Self-pay | Admitting: Urology

## 2023-05-11 NOTE — Telephone Encounter (Signed)
Patient's wife called and said that they got a call from Dr. Kipp Laurence office yesterday and were told that Dr. Lonna Cobb wants to do "something else" before starting treatment. She requests a call to discuss.

## 2023-05-13 ENCOUNTER — Ambulatory Visit: Admission: RE | Admit: 2023-05-13 | Payer: Medicare Other | Source: Ambulatory Visit

## 2023-05-13 ENCOUNTER — Ambulatory Visit: Payer: Medicare Other

## 2023-05-13 ENCOUNTER — Other Ambulatory Visit: Payer: Self-pay

## 2023-05-13 ENCOUNTER — Other Ambulatory Visit: Payer: Self-pay | Admitting: Urology

## 2023-05-13 DIAGNOSIS — N138 Other obstructive and reflux uropathy: Secondary | ICD-10-CM

## 2023-05-13 DIAGNOSIS — N401 Enlarged prostate with lower urinary tract symptoms: Secondary | ICD-10-CM

## 2023-05-13 NOTE — Progress Notes (Signed)
Surgical Physician Order Form [facility] [place]  Dr. Legrand Rams, MD  * Scheduling expectation : Next Available with [facility]  *Length of Case: 2 hours  *Clearance needed: yes-on Plavix, recently cleared for bladder biopsy  *Anticoagulation Instructions: Hold all anticoagulants  *Aspirin Instructions: N/A  *Post-op visit Date/Instructions: 3-day cath removal  *Diagnosis: BPH w/urinary obstruction  *Procedure:  HOLEP (22025)   Additional orders: N/A  -Admit type: OUTpatient  -Anesthesia: General  -VTE Prophylaxis Standing Order SCD's       Other:   -Standing Lab Orders Per Anesthesia    Lab other: UA&Urine Culture  -Standing Test orders EKG/Chest x-ray per Anesthesia       Test other:   - Medications:  Ancef 2gm IV  -Other orders: Needs preop appointment with Dr. Richardo Hanks to discuss surgery in detail

## 2023-05-31 ENCOUNTER — Encounter: Payer: Self-pay | Admitting: Urology

## 2023-05-31 ENCOUNTER — Telehealth: Payer: Self-pay

## 2023-05-31 ENCOUNTER — Ambulatory Visit: Payer: Medicare Other | Admitting: Urology

## 2023-05-31 ENCOUNTER — Telehealth: Payer: Self-pay | Admitting: *Deleted

## 2023-05-31 VITALS — BP 126/67 | HR 69 | Ht 72.0 in | Wt 185.0 lb

## 2023-05-31 DIAGNOSIS — R399 Unspecified symptoms and signs involving the genitourinary system: Secondary | ICD-10-CM | POA: Diagnosis not present

## 2023-05-31 DIAGNOSIS — C679 Malignant neoplasm of bladder, unspecified: Secondary | ICD-10-CM

## 2023-05-31 DIAGNOSIS — R310 Gross hematuria: Secondary | ICD-10-CM

## 2023-05-31 NOTE — Patient Instructions (Addendum)
HoLEP is typically a surgery to use for an enlarged prostate, however you have cancer cells lining the prostate channel, and we can remove the majority of those by performing the HOLEP surgery, and decrease the risk of complications from chemotherapy and radiation, as well as be able to decrease your amount of medications(can stop Flomax and finasteride after surgery).  We will also order a CT scan of your abdomen and pelvis to make sure no changes in the last year.   Holmium Laser Enucleation of the Prostate (HoLEP)  HoLEP is a treatment for men with benign prostatic hyperplasia (BPH). The laser surgery removed blockages of urine flow, and is done without any incisions on the body.     What is HoLEP?  HoLEP is a type of laser surgery used to treat obstruction (blockage) of urine flow as a result of benign prostatic hyperplasia (BPH). In men with BPH, the prostate gland is not cancerous, but has become enlarged. An enlarged prostate can result in a number of urinary tract symptoms such as weak urinary stream, difficulty in starting urination, inability to urinate, frequent urination, or getting up at night to urinate.  HoLEP was developed in the 1990's as a more effective and less expensive surgical option for BPH, compared to other surgical options such as laser vaporization(PVP/greenlight laser), transurethral resection of the prostate(TURP), and open simple prostatectomy.   What happens during a HoLEP?  HoLEP requires general anesthesia ("asleep" throughout the procedure).   An antibiotic is given to reduce the risk of infection  A surgical instrument called a resectoscope is inserted through the urethra (the tube that carries urine from the bladder). The resectoscope has a camera that allows the surgeon to view the internal structure of the prostate gland, and to see where the incisions are being made during surgery.  The laser is inserted into the resectoscope and is used to enucleate  (free up) the enlarged prostate tissue from the capsule (outer shell) and then to seal up any blood vessels. The tissue that has been removed is pushed back into the bladder.  A morcellator is placed through the resectoscope, and is used to suction out the prostate tissue that has been pushed into the bladder.  When the prostate tissue has been removed, the resectoscope is removed, and a foley catheter is placed to allow healing and drain the urine from the bladder.     What happens after a HoLEP?  More than 95% of patients go home the same day a few hours after surgery. Less than 5% will be admitted to the hospital overnight for observation to monitor the urine, or if they have other medical problems.  Fluid is flushed through the catheter for about 1 hour after surgery to clear any blood from the urine. It is normal to have some blood in the urine after surgery. The need for blood transfusion is extremely rare.  Eating and drinking are permitted after the procedure once the patient has fully awakened from anesthesia.  The catheter is usually removed 2-3 days after surgery- the patient will come to clinic to have the catheter removed and make sure they can urinate on their own.  It is very important to drink lots of fluids after surgery for one week to keep the bladder flushed.  At first, there may be some burning with urination, but this typically improved within a few hours to days. Most patients do not have a significant amount of pain, and narcotic pain medications are rarely  needed.  Symptoms of urinary frequency, urgency, and even leakage are NORMAL for the first few weeks after surgery as the bladder adjusts after having to work hard against blockage from the prostate for many years. This will improve, but can sometimes take several months.  The use of pelvic floor exercises (Kegel exercises) can help improve problems with urinary incontinence.   After catheter removal, patients  will be seen at 12 weeks and 6 months for symptom check  No heavy lifting for at least 2-3 weeks after surgery, however patients can walk and do light activities the first day after surgery. Return to work time depends on occupation.    What are the advantages of HoLEP?  HoLEP has been studied in many different parts of the world and has been shown to be a safe and effective procedure. Although there are many types of BPH surgeries available, HoLEP offers a unique advantage in being able to remove a large amount of tissue without any incisions on the body, even in very large prostates, while decreasing the risk of bleeding and providing tissue for pathology (to look for cancer). This decreases the need for blood transfusions during surgery, minimizes hospital stay, and reduces the risk of needing repeat treatment.  What are the side effects of HoLEP?  Temporary burning and bleeding during urination. Some blood may be seen in the urine for weeks after surgery and is part of the healing process.  Urinary incontinence (inability to control urine flow) is expected in all patients immediately after surgery and they should wear pads for the first few days/weeks. This typically improves over the course of several weeks. Performing Kegel exercises can help decrease leakage from stress maneuvers such as coughing, sneezing, or lifting. The rate of long term leakage is very low, 1-2%. Patients may also have leakage with urgency and this may be treated with medication. The risk of urge incontinence can be dependent on several factors including age, prostate size, symptoms, and other medical problems.  Retrograde ejaculation or "backwards ejaculation." In 80% of cases, the patient will not see any fluid during ejaculation after surgery.  Erectile function is generally not significantly affected.   What are the risks of HoLEP?  Injury to the urethra or development of scar tissue at a later date  Injury to  the capsule of the prostate (typically treated with longer catheterization).  Injury to the bladder or ureteral orifices (where the urine from the kidney drains out)  Infection of the bladder, testes, or kidneys (~4%)  Return of urinary obstruction at a later date requiring another operation (<2%)  Need for blood transfusion or re-operation due to bleeding  Failure to relieve all symptoms and/or need for prolonged catheterization after surgery  5-15% of patients are found to have previously undiagnosed prostate cancer in their specimen. Prostate cancer can be treated after HoLEP.  Standard risks of anesthesia including blood clots, heart attacks, etc ~1-2% risk of long term urinary incontinence (leakage)  When should I call my doctor?  Fever over 101.3 degrees  Inability to urinate, or large blood clots in the urine

## 2023-05-31 NOTE — Telephone Encounter (Signed)
Pre-operative Risk Assessment    Patient Name: [name]  DOB: 27-Apr-1935 MRN: [id number]   DATE OF LAST VISIT:  01/18/23 Terrilee Croak, PAC DATE OF NEXT VISIT: 07/20/23 DR. END   Request for Surgical Clearance    Procedure:    Holmium Laser Enucleation of the Prostate  Date of Surgery:  Clearance 06/10/23                                 Surgeon:  DR. Legrand Rams Surgeon's Group or Practice Name:  [facility] Phone number:  [phone] Fax number:  [phone]   Type of Clearance Requested:   - Medical  - Pharmacy:  Hold Clopidogrel (Plavix) x 5 DAYS PRIOR   Type of Anesthesia:  General    Additional requests/questions:    Elpidio Anis   05/31/2023, 3:28 PM    Letta Kocher, CMA Certified Medical Assistant   Progress Notes     Signed   Encounter Date: 05/13/2023   Signed       Phone Number: [phone] for Surgical Coordinator Fax Number: [phone]   REQUEST FOR SURGICAL CLEARANCE                                           Date: Date: 05/31/23   Faxed to: Dr. Okey Dupre   Surgeon: Dr. Legrand Rams, MD             Date of Surgery: 06/10/2023   Operation:  Holmium Laser Enucleation of the Prostate   Anesthesia Type: General    Diagnosis: Benign Prostatic Hyperplasia with Urinary Obstruction   Patient Requires:    Cardiac / Vascular Clearance : Yes   Reason: Would like for patient to hold Plavix for 5 days prior to surgery.    Risk Assessment:     Low   []       Moderate   []     High   []                 This patient is optimized for surgery  YES []       NO   []      I recommend further assessment/workup prior to surgery. YES []      NO  []    Appointment scheduled for: _______________________    Further recommendations: ____________________________________        Physician Signature:__________________________________    Printed Name: ________________________________________    Date: _________________             Electronically signed by Letta Kocher, CMA at 05/31/2023  2:47 PM

## 2023-05-31 NOTE — Progress Notes (Signed)
   [facility] Surgical Posting Form  Surgery Date: Date: 06/10/2023  Surgeon: Dr. Legrand Rams, MD  Inpt ( No  )   Outpt (Yes)   Obs ( No  )   Diagnosis: N40.1, N13.8 Benign Prostatic Hyperplasia with Urinary Obstruction  -CPT: 40981  Surgery: Holmium Laser Enucleation of the Prostate  Stop Anticoagulations: Yes will need to hold Plavix  Cardiac/Medical/Pulmonary Clearance needed: yes  Clearance needed from Dr: Okey Dupre  Clearance request sent on: Date: 05/31/23  *Orders entered into EPIC  Date: 05/31/23   *Case booked in EPIC  Date: 05/31/23  *Notified pt of Surgery: Date: 05/31/23  PRE-OP UA & CX: yes, will obtain in clinic on 06/01/2023  *Placed into Prior Authorization Work Angela Nevin Date: 05/31/23  Assistant/laser/rep:No

## 2023-05-31 NOTE — Telephone Encounter (Signed)
-----   Message from [facility] Melissa H sent at 05/31/2023  2:48 PM EDT ----- Surgical Clearance

## 2023-05-31 NOTE — Telephone Encounter (Signed)
Per Dr. Richardo Hanks, Patient is to be scheduled for  Holmium Laser Enucleation of the Prostate  [name] was contacted and possible surgical dates were discussed, Friday November 8th, 2024 was agreed upon for surgery.   Patient was instructed that Dr. Richardo Hanks will require them to provide a pre-op UA & CX prior to surgery. This was ordered and scheduled drop off appointment was made for 06/01/2023.     Patient was directed to call [phone] between 1-3pm the day before surgery to find out surgical arrival time.  Instructions were given not to eat or drink from midnight on the night before surgery and have a driver for the day of surgery. On the surgery day patient was instructed to enter through the [facility] entrance of [facility] report the Same Day Surgery desk.   Pre-Admit Testing will be in contact via phone to set up an interview with the anesthesia team to review your history and medications prior to surgery.   Reminder of this information was sent via Placed at the front office for patient to pick up.  Patient is to hold anticoag's per Dr. Richardo Hanks.  Currently taking Plavix by Dr. Okey Dupre. Clearance requested to stop 5days prior to surgery, clearance form was faxed to [facility]. Awaiting response

## 2023-05-31 NOTE — Progress Notes (Signed)
  Phone Number: [phone] for Surgical Coordinator Fax Number: [phone]  REQUEST FOR SURGICAL CLEARANCE       Date: Date: 05/31/23  Faxed to: Dr. Okey Dupre  Surgeon: Dr. Legrand Rams, MD     Date of Surgery: 06/10/2023  Operation:  Holmium Laser Enucleation of the Prostate  Anesthesia Type: General   Diagnosis: Benign Prostatic Hyperplasia with Urinary Obstruction  Patient Requires:   Cardiac / Vascular Clearance : Yes  Reason: Would like for patient to hold Plavix for 5 days prior to surgery.   Risk Assessment:    Low   []       Moderate   []     High   []           This patient is optimized for surgery  YES []       NO   []    I recommend further assessment/workup prior to surgery. YES []      NO  []   Appointment scheduled for: _______________________   Further recommendations: ____________________________________     Physician Signature:__________________________________   Printed Name: ________________________________________   Date: _________________

## 2023-05-31 NOTE — Progress Notes (Signed)
05/31/2023 10:47 AM   [name] June 30, 1935 [id number]  Reason for visit: Urothelial cell carcinoma of prostatic urethra, consider HOLEP  HPI: 88 year old male who has been followed by Dr. Lonna Cobb for gross hematuria.  He is here with his wife and daughter-in-law who helps provide much of the history.  Records reviewed extensively from Dr. Lonna Cobb as well as oncology Dr. Cathie Hoops and radiation oncology Dr. Aggie Cosier.  He has had recurrent gross hematuria, and in September 2024 underwent cystoscopy and prostatic urethral biopsies that showed infiltrating high-grade urothelial cell carcinoma invading muscularis propria.  He has significant obstructive urinary symptoms with urgency, frequency, weak stream-on maximal medical therapy with Flomax and finasteride.  He was referred to consider HOLEP as an option to improve his recurrent gross hematuria, obstructive symptoms, decreased tumor burden, and decreased risk of retention with radiation.  He deferred cystectomy with his age, but was interested in trimodal therapy.  He has not had repeat abdominal or pelvic imaging since September 2023, and I ordered a repeat CT urogram.  We discussed possible lymphadenopathy that could change treatment plan. I personally reviewed and interpreted prior CT urograms from 2023 as well as the chest CT, prostate measures 50 g on prior CT.  We discussed the risks and benefits of HoLEP at length.  The procedure requires general anesthesia and takes 1 to 2 hours, and a holmium laser is used to enucleate the prostate and push this tissue into the bladder.  A morcellator is then used to remove this tissue, which is sent for pathology.  The vast majority(>95%) of patients are able to discharge the same day with a catheter in place for 2 to 3 days, and will follow-up in clinic for a voiding trial.  We specifically discussed the risks of bleeding, infection, retrograde ejaculation, temporary urgency and urge incontinence, very low  risk of long-term incontinence, urethral stricture/bladder neck contracture, pathologic evaluation of prostate tissue and possible detection of prostate cancer or other malignancy, and possible need for additional procedures.  We reviewed the goals of improving his outlet obstruction, decreasing tumor burden, decreasing risk of retention with upcoming radiation.  Highest risk would be persistent incontinence with his known malignancy as well as age.  We also reviewed need for repeat staging imaging of the abdomen and pelvis, his last abdominal imaging was over a year ago.  -Schedule HoLEP -Repeat staging imaging ordered with CT urogram(last cross-sectional abdominal/pelvic imaging was September 2023) Can proceed with chemotherapy 2 weeks after HoLEP, would wait 6 weeks prior to starting radiation  Sondra Come, MD  [facility] [address] [place], [place] [address] [phone]

## 2023-06-01 ENCOUNTER — Other Ambulatory Visit: Payer: Medicare Other

## 2023-06-01 ENCOUNTER — Telehealth: Payer: Self-pay

## 2023-06-01 ENCOUNTER — Ambulatory Visit: Payer: Medicare Other | Admitting: Radiation Oncology

## 2023-06-01 DIAGNOSIS — N138 Other obstructive and reflux uropathy: Secondary | ICD-10-CM | POA: Diagnosis not present

## 2023-06-01 DIAGNOSIS — N401 Enlarged prostate with lower urinary tract symptoms: Secondary | ICD-10-CM | POA: Diagnosis not present

## 2023-06-01 LAB — MICROSCOPIC EXAMINATION
RBC, Urine: >30 /[HPF] — AB (ref 0–2)
WBC, UA: >30 /[HPF] — AB (ref 0–5)

## 2023-06-01 LAB — URINALYSIS, COMPLETE
Bilirubin, UA: NEGATIVE
Glucose, UA: NEGATIVE
Ketones, UA: NEGATIVE
Nitrite, UA: NEGATIVE
Specific Gravity, UA: >1.03 — ABNORMAL HIGH (ref 1.005–1.030)
Urobilinogen, Ur: 1 mg/dL (ref 0.2–1.0)
pH, UA: 5.5 (ref 5.0–7.5)

## 2023-06-01 NOTE — Telephone Encounter (Signed)
Patient Name: [name]  DOB: 01-04-1935 MRN: [id number]  Primary Cardiologist: Yvonne Kendall, MD  Chart reviewed as part of pre-operative protocol coverage. Given past medical history and time since last visit, based on ACC/AHA guidelines, BELA SOKOLOFF is at acceptable risk for the planned procedure without further cardiovascular testing.  Patient reports no changes since his previous phone visit 1 month ago.  Patient can hold Plavix 5 days prior to procedure and should restart postprocedure when surgically safe.  The patient was advised that if he develops new symptoms prior to surgery to contact our office to arrange for a follow-up visit, and he verbalized understanding.  I will route this recommendation to the requesting party via Epic fax function and remove from pre-op pool.  Please call with questions.  Napoleon Form, Leodis Rains, NP 06/01/2023, 10:13 AM

## 2023-06-01 NOTE — Telephone Encounter (Signed)
Received clearance from [facility] to hold Plavix for 5 days prior to surgery. Left detailed message with patient to hold this starting on Sunday November 3rd.

## 2023-06-02 ENCOUNTER — Telehealth: Payer: Self-pay

## 2023-06-02 DIAGNOSIS — R3989 Other symptoms and signs involving the genitourinary system: Secondary | ICD-10-CM

## 2023-06-02 MED ORDER — AMOXICILLIN 875 MG PO TABS: 875.0000 mg | ORAL_TABLET | Freq: Two times a day (BID) | ORAL | 0 refills | Status: AC

## 2023-06-02 NOTE — Telephone Encounter (Signed)
-----   Message from Sondra Come sent at 06/02/2023  8:25 AM EDT ----- Urine does appear to show infection, recommend amoxicillin 875 mg twice daily x 14 days, this may help with his burning with urination  Legrand Rams, MD 06/02/2023

## 2023-06-02 NOTE — Telephone Encounter (Signed)
Called pt's wife per DPR advised wife of the information below per [place]. Wife voiced understanding. RX sent.

## 2023-06-03 ENCOUNTER — Encounter
Admission: RE | Admit: 2023-06-03 | Discharge: 2023-06-03 | Disposition: A | Discharge status: Home / Self Care | Payer: Medicare Other | Source: Ambulatory Visit | Attending: Urology | Admitting: Urology

## 2023-06-03 ENCOUNTER — Other Ambulatory Visit: Payer: Self-pay

## 2023-06-03 DIAGNOSIS — Z01812 Encounter for preprocedural laboratory examination: Secondary | ICD-10-CM

## 2023-06-03 DIAGNOSIS — I2510 Atherosclerotic heart disease of native coronary artery without angina pectoris: Secondary | ICD-10-CM

## 2023-06-03 NOTE — Patient Instructions (Addendum)
Your procedure is scheduled on:06/10/2023 Friday  Report to the Registration Desk on the 1st floor of the [facility]. To find out your arrival time, please call [phone] between 1PM - 3PM on: 06/09/2023 Thursday If your arrival time is 6:00 am, do not arrive before that time as the [facility] entrance doors do not open until 6:00 am.  REMEMBER: Instructions that are not followed completely may result in serious medical risk, up to and including death; or upon the discretion of your surgeon and anesthesiologist your surgery may need to be rescheduled.  Do not eat food after midnight the night before surgery.  No gum chewing or hard candies.  One week prior to surgery: Stop Anti-inflammatories (NSAIDS) such as Advil, Aleve, Ibuprofen, Motrin, Naproxen, Naprosyn and Aspirin based products such as Excedrin, Goody's Powder, BC Powder. Stop ANY OVER THE COUNTER supplements until after surgery like multivitamin  You may however, continue to take Tylenol if needed for pain up until the day of surgery.    Continue taking all of your other prescription medications up until the day of surgery with the exception of Plavix.  **Follow recommendations regarding stopping blood thinners like the  Plavix.  ON THE DAY OF SURGERY ONLY TAKE THESE MEDICATIONS WITH SIPS OF WATER:  atorvastatin (LIPITOR) finasteride (PROSCAR) 3.   metoprolol succinate (toprol) 4 .  tamsulosin (FLOMAX 5.  Antibiotic- if okay with empty stomach.  No Alcohol for 24 hours before or after surgery.  No Smoking including e-cigarettes for 24 hours before surgery.  No chewable tobacco products for at least 6 hours before surgery.  No nicotine patches on the day of surgery.  Do not use any "recreational" drugs for at least a week (preferably 2 weeks) before your surgery.  Please be advised that the combination of cocaine and anesthesia may have negative outcomes, up to and including death. If you test positive for  cocaine, your surgery will be cancelled.  On the morning of surgery brush your teeth with toothpaste and water, you may rinse your mouth with mouthwash if you wish. Do not swallow any toothpaste or mouthwash.  You need to shower on day of surgery.  Do not wear jewelry, make-up, hairpins, clips or nail polish.  Do not wear lotions, powders, or perfumes.   Do not shave body hair from the neck down 48 hours before surgery.  Contact lenses, hearing aids and dentures may not be worn into surgery.  Do not bring valuables to the hospital. [facility] is not responsible for any missing/lost belongings or valuables.   Notify your doctor if there is any change in your medical condition (cold, fever, infection).  Wear comfortable clothing (specific to your surgery type) to the hospital.  After surgery, you can help prevent lung complications by doing breathing exercises.  Take deep breaths and cough every 1-2 hours. Your doctor may order a device called an Incentive Spirometer to help you take deep breaths.   If you are being discharged the day of surgery, you will not be allowed to drive home. You will need a responsible individual to drive you home and stay with you for 24 hours after surgery.    Please call the Pre-admissions Testing Dept. at [phone] if you have any questions about these instructions.  Surgery Visitation Policy:  Patients having surgery or a procedure may have two visitors.  Children under the age of 74 must have an adult with them who is not the patient.

## 2023-06-04 LAB — CULTURE, URINE COMPREHENSIVE

## 2023-06-06 ENCOUNTER — Ambulatory Visit
Admission: RE | Admit: 2023-06-06 | Discharge: 2023-06-06 | Disposition: A | Discharge status: Home / Self Care | Payer: Medicare Other | Source: Ambulatory Visit | Attending: Urology | Admitting: Urology

## 2023-06-06 ENCOUNTER — Inpatient Hospital Stay
Admission: RE | Admit: 2023-06-06 | Discharge: 2023-06-06 | Discharge status: Home / Self Care | Payer: Medicare Other | Source: Ambulatory Visit | Attending: Urology

## 2023-06-06 DIAGNOSIS — Z01812 Encounter for preprocedural laboratory examination: Secondary | ICD-10-CM | POA: Diagnosis not present

## 2023-06-06 DIAGNOSIS — Z01810 Encounter for preprocedural cardiovascular examination: Secondary | ICD-10-CM | POA: Diagnosis not present

## 2023-06-06 DIAGNOSIS — R310 Gross hematuria: Secondary | ICD-10-CM | POA: Diagnosis not present

## 2023-06-06 DIAGNOSIS — C679 Malignant neoplasm of bladder, unspecified: Secondary | ICD-10-CM | POA: Diagnosis not present

## 2023-06-06 DIAGNOSIS — N281 Cyst of kidney, acquired: Secondary | ICD-10-CM | POA: Diagnosis not present

## 2023-06-06 DIAGNOSIS — I2510 Atherosclerotic heart disease of native coronary artery without angina pectoris: Secondary | ICD-10-CM | POA: Diagnosis not present

## 2023-06-06 MED ORDER — IOHEXOL 300 MG/ML  SOLN
100.0000 mL | Freq: Once | INTRAMUSCULAR | Status: AC | PRN
  Administered 2023-06-06: 100 mL via INTRAVENOUS

## 2023-06-07 ENCOUNTER — Telehealth: Payer: Self-pay | Admitting: Oncology

## 2023-06-07 ENCOUNTER — Other Ambulatory Visit: Payer: Self-pay | Admitting: Oncology

## 2023-06-07 DIAGNOSIS — C689 Malignant neoplasm of urinary organ, unspecified: Secondary | ICD-10-CM

## 2023-06-07 NOTE — Telephone Encounter (Signed)
Tried to call pt to let him know about the added appts on Dec 4th, call could not go through. AVS has been mailed.

## 2023-06-08 ENCOUNTER — Encounter: Payer: Self-pay | Admitting: Urology
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
## 2023-06-09 MED ORDER — ORAL CARE MOUTH RINSE: 15.0000 mL | Freq: Once | OROMUCOSAL | Status: AC

## 2023-06-09 MED ORDER — CEFAZOLIN SODIUM-DEXTROSE 2-4 GM/100ML-% IV SOLN
2.0000 g | INTRAVENOUS | Status: AC
  Administered 2023-06-10: 2 g via INTRAVENOUS

## 2023-06-09 MED ORDER — LACTATED RINGERS IV SOLN: INTRAVENOUS | Status: DC

## 2023-06-09 MED ORDER — CHLORHEXIDINE GLUCONATE 0.12 % MT SOLN
15.0000 mL | Freq: Once | OROMUCOSAL | Status: AC
  Administered 2023-06-10: 15 mL via OROMUCOSAL

## 2023-06-10 ENCOUNTER — Ambulatory Visit: Payer: Medicare Other | Admitting: Urgent Care

## 2023-06-10 ENCOUNTER — Encounter: Payer: Self-pay | Admitting: Urology

## 2023-06-10 ENCOUNTER — Other Ambulatory Visit: Payer: Self-pay

## 2023-06-10 ENCOUNTER — Ambulatory Visit
Admission: RE | Admit: 2023-06-10 | Discharge: 2023-06-10 | Disposition: A | Discharge status: Home / Self Care | Payer: Medicare Other | Attending: Urology | Admitting: Urology

## 2023-06-10 ENCOUNTER — Encounter
Admission: RE | Disposition: A | Discharge status: Home / Self Care | Payer: Self-pay | Source: Home / Self Care | Attending: Urology

## 2023-06-10 DIAGNOSIS — I110 Hypertensive heart disease with heart failure: Secondary | ICD-10-CM | POA: Insufficient documentation

## 2023-06-10 DIAGNOSIS — C679 Malignant neoplasm of bladder, unspecified: Secondary | ICD-10-CM | POA: Diagnosis not present

## 2023-06-10 DIAGNOSIS — N3289 Other specified disorders of bladder: Secondary | ICD-10-CM | POA: Diagnosis not present

## 2023-06-10 DIAGNOSIS — Z8551 Personal history of malignant neoplasm of bladder: Secondary | ICD-10-CM

## 2023-06-10 DIAGNOSIS — C61 Malignant neoplasm of prostate: Secondary | ICD-10-CM | POA: Insufficient documentation

## 2023-06-10 DIAGNOSIS — Z955 Presence of coronary angioplasty implant and graft: Secondary | ICD-10-CM | POA: Diagnosis not present

## 2023-06-10 DIAGNOSIS — N401 Enlarged prostate with lower urinary tract symptoms: Secondary | ICD-10-CM | POA: Diagnosis not present

## 2023-06-10 DIAGNOSIS — I2510 Atherosclerotic heart disease of native coronary artery without angina pectoris: Secondary | ICD-10-CM | POA: Insufficient documentation

## 2023-06-10 DIAGNOSIS — I252 Old myocardial infarction: Secondary | ICD-10-CM | POA: Insufficient documentation

## 2023-06-10 DIAGNOSIS — N138 Other obstructive and reflux uropathy: Secondary | ICD-10-CM | POA: Insufficient documentation

## 2023-06-10 DIAGNOSIS — R3912 Poor urinary stream: Secondary | ICD-10-CM | POA: Insufficient documentation

## 2023-06-10 DIAGNOSIS — G709 Myoneural disorder, unspecified: Secondary | ICD-10-CM | POA: Insufficient documentation

## 2023-06-10 DIAGNOSIS — I5022 Chronic systolic (congestive) heart failure: Secondary | ICD-10-CM | POA: Insufficient documentation

## 2023-06-10 DIAGNOSIS — R350 Frequency of micturition: Secondary | ICD-10-CM | POA: Diagnosis not present

## 2023-06-10 DIAGNOSIS — Z79899 Other long term (current) drug therapy: Secondary | ICD-10-CM | POA: Insufficient documentation

## 2023-06-10 DIAGNOSIS — M1990 Unspecified osteoarthritis, unspecified site: Secondary | ICD-10-CM | POA: Insufficient documentation

## 2023-06-10 HISTORY — DX: Bifascicular block: I45.2

## 2023-06-10 HISTORY — DX: Other obstructive and reflux uropathy: N40.1

## 2023-06-10 HISTORY — PX: HOLEP-LASER ENUCLEATION OF THE PROSTATE WITH MORCELLATION: SHX6641

## 2023-06-10 HISTORY — DX: Atherosclerosis of aorta: I70.0

## 2023-06-10 HISTORY — DX: Other obstructive and reflux uropathy: N13.8

## 2023-06-10 HISTORY — DX: Cardiomegaly: I51.7

## 2023-06-10 SURGERY — ENUCLEATION, PROSTATE, USING LASER, WITH MORCELLATION
Anesthesia: General

## 2023-06-10 MED ORDER — SUGAMMADEX SODIUM 200 MG/2ML IV SOLN
INTRAVENOUS | Status: DC | PRN
  Administered 2023-06-10: 200 mg via INTRAVENOUS

## 2023-06-10 MED ORDER — PROPOFOL 10 MG/ML IV BOLUS
INTRAVENOUS | Status: AC
  Filled 2023-06-10: qty 20

## 2023-06-10 MED ORDER — ONDANSETRON HCL 4 MG/2ML IJ SOLN
INTRAMUSCULAR | Status: AC
  Filled 2023-06-10: qty 2

## 2023-06-10 MED ORDER — SUCCINYLCHOLINE CHLORIDE 200 MG/10ML IV SOSY
PREFILLED_SYRINGE | INTRAVENOUS | Status: DC | PRN
  Administered 2023-06-10: 100 mg via INTRAVENOUS

## 2023-06-10 MED ORDER — OXYCODONE HCL 5 MG/5ML PO SOLN: 5.0000 mg | Freq: Once | ORAL | Status: DC | PRN

## 2023-06-10 MED ORDER — ONDANSETRON HCL 4 MG/2ML IJ SOLN: 4.0000 mg | Freq: Once | INTRAMUSCULAR | Status: DC | PRN

## 2023-06-10 MED ORDER — PHENYLEPHRINE 80 MCG/ML (10ML) SYRINGE FOR IV PUSH (FOR BLOOD PRESSURE SUPPORT)
PREFILLED_SYRINGE | INTRAVENOUS | Status: AC
  Filled 2023-06-10: qty 10

## 2023-06-10 MED ORDER — TRAMADOL HCL 50 MG PO TABS
25.0000 mg | ORAL_TABLET | Freq: Four times a day (QID) | ORAL | 0 refills | Status: AC | PRN
Start: 2023-06-10 — End: 2023-06-13

## 2023-06-10 MED ORDER — ONDANSETRON HCL 4 MG/2ML IJ SOLN
INTRAMUSCULAR | Status: DC | PRN
  Administered 2023-06-10: 4 mg via INTRAVENOUS

## 2023-06-10 MED ORDER — CHLORHEXIDINE GLUCONATE 0.12 % MT SOLN
OROMUCOSAL | Status: AC
  Filled 2023-06-10: qty 15

## 2023-06-10 MED ORDER — ACETAMINOPHEN 10 MG/ML IV SOLN: 1000.0000 mg | Freq: Once | INTRAVENOUS | Status: DC | PRN

## 2023-06-10 MED ORDER — FENTANYL CITRATE (PF) 100 MCG/2ML IJ SOLN: 25.0000 ug | INTRAMUSCULAR | Status: DC | PRN

## 2023-06-10 MED ORDER — DEXAMETHASONE SODIUM PHOSPHATE 10 MG/ML IJ SOLN
INTRAMUSCULAR | Status: DC | PRN
  Administered 2023-06-10: 5 mg via INTRAVENOUS

## 2023-06-10 MED ORDER — ROCURONIUM BROMIDE 10 MG/ML (PF) SYRINGE
PREFILLED_SYRINGE | INTRAVENOUS | Status: AC
  Filled 2023-06-10: qty 10

## 2023-06-10 MED ORDER — OXYCODONE HCL 5 MG PO TABS: 5.0000 mg | ORAL_TABLET | Freq: Once | ORAL | Status: DC | PRN

## 2023-06-10 MED ORDER — PROPOFOL 10 MG/ML IV BOLUS
INTRAVENOUS | Status: DC | PRN
  Administered 2023-06-10: 100 mg via INTRAVENOUS

## 2023-06-10 MED ORDER — CEFAZOLIN SODIUM-DEXTROSE 2-4 GM/100ML-% IV SOLN
INTRAVENOUS | Status: AC
  Filled 2023-06-10: qty 100

## 2023-06-10 MED ORDER — DEXAMETHASONE SODIUM PHOSPHATE 10 MG/ML IJ SOLN
INTRAMUSCULAR | Status: AC
  Filled 2023-06-10: qty 1

## 2023-06-10 MED ORDER — LIDOCAINE HCL (CARDIAC) PF 100 MG/5ML IV SOSY
PREFILLED_SYRINGE | INTRAVENOUS | Status: DC | PRN
  Administered 2023-06-10: 80 mg via INTRAVENOUS

## 2023-06-10 MED ORDER — FENTANYL CITRATE (PF) 100 MCG/2ML IJ SOLN
INTRAMUSCULAR | Status: AC
  Filled 2023-06-10: qty 2

## 2023-06-10 MED ORDER — PHENYLEPHRINE 80 MCG/ML (10ML) SYRINGE FOR IV PUSH (FOR BLOOD PRESSURE SUPPORT)
PREFILLED_SYRINGE | INTRAVENOUS | Status: DC | PRN
  Administered 2023-06-10: 80 ug via INTRAVENOUS

## 2023-06-10 MED ORDER — ROCURONIUM BROMIDE 100 MG/10ML IV SOLN
INTRAVENOUS | Status: DC | PRN
  Administered 2023-06-10: 10 mg via INTRAVENOUS
  Administered 2023-06-10: 20 mg via INTRAVENOUS

## 2023-06-10 MED ORDER — SODIUM CHLORIDE 0.9 % IR SOLN
Status: DC | PRN
  Administered 2023-06-10: 12000 mL

## 2023-06-10 MED ORDER — FENTANYL CITRATE (PF) 100 MCG/2ML IJ SOLN
INTRAMUSCULAR | Status: DC | PRN
  Administered 2023-06-10 (×2): 50 ug via INTRAVENOUS

## 2023-06-10 MED ORDER — LIDOCAINE HCL (PF) 2 % IJ SOLN
INTRAMUSCULAR | Status: AC
  Filled 2023-06-10: qty 5

## 2023-06-10 SURGICAL SUPPLY — 37 items
ADAPTER IRRIG TUBE 2 SPIKE SOL (ADAPTER) ×2 IMPLANT
ADPR TBG 2 SPK PMP STRL ASCP (ADAPTER) ×2
BAG DRN LRG CPC RND TRDRP CNTR (MISCELLANEOUS) ×1
BAG DRN RND TRDRP ANRFLXCHMBR (UROLOGICAL SUPPLIES) ×1
BAG URINE DRAIN 2000ML AR STRL (UROLOGICAL SUPPLIES) ×1 IMPLANT
BAG URO DRAIN 4000ML (MISCELLANEOUS) ×1 IMPLANT
CATH FOLEY 3WAY 30CC 24FR (CATHETERS) ×1
CATH URETL OPEN END 4X70 (CATHETERS) ×1 IMPLANT
CATH URTH STD 24FR FL 3W 2 (CATHETERS) ×1 IMPLANT
CONTAINER COLLECT MORCELLATR (MISCELLANEOUS) ×1 IMPLANT
DRAPE UTILITY 15X26 TOWEL STRL (DRAPES) IMPLANT
ELECT BIVAP BIPO 22/24 DONUT (ELECTROSURGICAL)
ELECTRD BIVAP BIPO 22/24 DONUT (ELECTROSURGICAL) IMPLANT
FIBER LASER MOSES 550 DFL (Laser) ×1 IMPLANT
FILTER OVERFLOW MORCELLATOR (FILTER) ×1 IMPLANT
GLOVE BIOGEL PI IND STRL 7.5 (GLOVE) ×1 IMPLANT
GOWN STRL REUS W/ TWL LRG LVL3 (GOWN DISPOSABLE) ×1 IMPLANT
GOWN STRL REUS W/ TWL XL LVL3 (GOWN DISPOSABLE) ×1 IMPLANT
GOWN STRL REUS W/TWL LRG LVL3 (GOWN DISPOSABLE) ×1
GOWN STRL REUS W/TWL XL LVL3 (GOWN DISPOSABLE) ×1
HOLDER FOLEY CATH W/STRAP (MISCELLANEOUS) ×1 IMPLANT
IV NS IRRIG 3000ML ARTHROMATIC (IV SOLUTION) ×5 IMPLANT
KIT TURNOVER CYSTO (KITS) ×1 IMPLANT
MBRN O SEALING YLW 17 FOR INST (MISCELLANEOUS) ×1
MEMBRANE SLNG YLW 17 FOR INST (MISCELLANEOUS) ×1 IMPLANT
MORCELLATOR COLLECT CONTAINER (MISCELLANEOUS) ×1
MORCELLATOR OVERFLOW FILTER (FILTER) ×1
MORCELLATOR ROTATION 4.75 335 (MISCELLANEOUS) ×1 IMPLANT
PACK CYSTO AR (MISCELLANEOUS) ×1 IMPLANT
SET CYSTO W/LG BORE CLAMP LF (SET/KITS/TRAYS/PACK) ×1 IMPLANT
SET IRRIG Y TYPE TUR BLADDER L (SET/KITS/TRAYS/PACK) ×1 IMPLANT
SLEEVE PROTECTION STRL DISP (MISCELLANEOUS) ×2 IMPLANT
SURGILUBE 2OZ TUBE FLIPTOP (MISCELLANEOUS) ×1 IMPLANT
SYR TOOMEY IRRIG 70ML (MISCELLANEOUS) ×1
SYRINGE TOOMEY IRRIG 70ML (MISCELLANEOUS) ×1 IMPLANT
TUBE PUMP MORCELLATOR PIRANHA (TUBING) ×1 IMPLANT
WATER STERILE IRR 1000ML POUR (IV SOLUTION) ×1 IMPLANT

## 2023-06-10 NOTE — Transfer of Care (Signed)
Immediate Anesthesia Transfer of Care Note  Patient: [name]  Procedure(s) Performed: HOLEP-LASER ENUCLEATION OF THE PROSTATE WITH MORCELLATION  Patient Location: PACU  Anesthesia Type:General  Level of Consciousness: drowsy  Airway & Oxygen Therapy: Patient Spontanous Breathing and Patient connected to face mask oxygen  Post-op Assessment: Report given to RN and Post -op Vital signs reviewed and stable  Post vital signs: stable  Last Vitals:  Vitals Value Taken Time  BP 173/91 06/10/23 0945  Temp 36 C 06/10/23 0945  Pulse 48 06/10/23 0947  Resp 13 06/10/23 0947  SpO2 100 % 06/10/23 0947  Vitals shown include unfiled device data.  Last Pain:  Vitals:   06/10/23 0758  TempSrc: Temporal  PainSc: 0-No pain         Complications:  Encounter Notable Events  Notable Event Outcome Phase Comment  Difficult to intubate - expected  Intraprocedure Filed from anesthesia note documentation.
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
## 2023-06-10 NOTE — Op Note (Signed)
Date of procedure: 06/10/23  Preoperative diagnosis:  BPH with obstruction Gross hematuria Bladder cancer  Postoperative diagnosis:  Same  Procedure: HoLEP (Holmium Laser Enucleation of the Prostate)  Surgeon: Legrand Rams, MD  Anesthesia: General  Complications: None  Intraoperative findings:  Abnormal anatomy at the left bladder neck from prior resection, unable to definitively identify the left ureteral orifice Uncomplicated HOLEP, excellent hemostasis, right ureteral orifice intact, verumontanum intact Possible residual tumor at the left bladder neck  EBL: Minimal  Specimens: Prostate chips  Enucleation time: 15 minutes  Morcellation time: 7 minutes  Intra-op weight: 34g  Drains: 24 French three-way, 60 cc in balloon  Indication: [name] is a 88 y.o. patient with obstructive symptoms and recurrent gross hematuria, recent biopsy within the prostatic fossa by Dr. Lonna Dunn showed urothelial cell carcinoma with muscle invasion.  He opted for HOLEP for obstructive symptoms and recurrent gross hematuria, as well as to decrease chance of retention with upcoming Tri modal therapy and radiation for bladder cancer.    After reviewing the management options for treatment, they elected to proceed with the above surgical procedure(s). We have discussed the potential benefits and risks of the procedure, side effects of the proposed treatment, the likelihood of the patient achieving the goals of the procedure, and any potential problems that might occur during the procedure or recuperation.  We specifically discussed the risks of bleeding, infection, hematuria and clot retention, need for additional procedures, possible overnight hospital stay, temporary urgency and incontinence, rare long-term incontinence, and retrograde ejaculation.  Informed consent has been obtained.   Description of procedure:  The patient was taken to the operating room and general anesthesia was induced.   The patient was placed in the dorsal lithotomy position, prepped and draped in the usual sterile fashion, and preoperative antibiotics(Ancef) were administered.  SCDs were placed for DVT prophylaxis.  A preoperative time-out was performed.   [name] sounds were used to gently dilated the urethra up to 69F. The 44 French continuous flow resectoscope was inserted into the urethra using the visual obturator  The prostate was moderate in size, the bladder neck was abnormal appearing from prior resection at the left bladder neck, could not definitively identify the left ureteral orifice.  Right ureteral orifice was clearly identified. The bladder was thoroughly inspected and notable for moderate trabeculations, no suspicious lesions within the bladder.    The laser was set to 2 J and 60 Hz and early apical release was performed by making a circumferential mucosal incision proximal to the sphincter.  A lambda incision was then made proximal to the verumontanum.  The prostate was enucleated en bloc circumferentially into the bladder.  The capsule was examined and laser was used for meticulous hemostasis.    The 16 French resectoscope was then switched out for the 26 French nephroscope and prostate tissue was morcellated(Piranha) and the tissue sent to pathology.  A 24 French three-way catheter was inserted easily with the aid of a catheter guide, and 60 cc were placed in the balloon.  Urine was faint pink.  The catheter irrigated easily with a Toomey syringe.  CBI was initiated.   The patient tolerated the procedure well without any immediate complications and was extubated and transferred to the recovery room in stable condition.  Urine was clear on fast CBI.  Disposition: Stable to PACU  Plan: -Wean CBI in PACU, anticipate discharge home today with Foley removal in clinic in 2-3 days -Follow-up pathology results -Follow-up repeat CT scan -  Can proceed with chemotherapy as soon as 2-3 weeks after  HOLEP, but would recommend waiting at least 6 weeks prior to starting radiation to aid in return to continence  Legrand Rams, MD 06/10/2023

## 2023-06-10 NOTE — Anesthesia Procedure Notes (Signed)
Procedure Name: Intubation Date/Time: 06/10/2023 8:47 AM  Performed by: Rodney Booze, CRNAPre-anesthesia Checklist: Patient identified, Emergency Drugs available, Suction available and Patient being monitored Patient Re-evaluated:Patient Re-evaluated prior to induction Oxygen Delivery Method: Circle system utilized Preoxygenation: Pre-oxygenation with 100% oxygen Induction Type: IV induction Ventilation: Mask ventilation without difficulty Laryngoscope Size: McGraph (x blade) Tube type: Oral Tube size: 7.0 mm Number of attempts: 1 Airway Equipment and Method: Stylet and Oral airway Placement Confirmation: ETT inserted through vocal cords under direct vision, positive ETCO2 and breath sounds checked- equal and bilateral Secured at: 21 cm Tube secured with: Tape Dental Injury: Teeth and Oropharynx as per pre-operative assessment  Difficulty Due To: Difficulty was anticipated, Difficult Airway- due to dentition and Difficult Airway- due to reduced neck mobility
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
## 2023-06-10 NOTE — Anesthesia Postprocedure Evaluation (Signed)
Anesthesia Post Note  Patient: NEZIAH MATHIEU  Procedure(s) Performed: HOLEP-LASER ENUCLEATION OF THE PROSTATE WITH MORCELLATION  Patient location during evaluation: PACU Anesthesia Type: General Level of consciousness: awake and alert Pain management: pain level controlled Vital Signs Assessment: post-procedure vital signs reviewed and stable Respiratory status: spontaneous breathing, nonlabored ventilation, respiratory function stable and patient connected to nasal cannula oxygen Cardiovascular status: blood pressure returned to baseline and stable Postop Assessment: no apparent nausea or vomiting Anesthetic complications: no   Encounter Notable Events  Notable Event Outcome Phase Comment  Difficult to intubate - expected  Intraprocedure Filed from anesthesia note documentation.     Last Vitals:  Vitals:   06/10/23 1016 06/10/23 1032  BP: (!) 170/87 (!) 154/89  Pulse: 68 70  Resp: (!) 23 (!) 22  Temp:    SpO2: 97% 97%    Last Pain:  Vitals:   06/10/23 1016  TempSrc:   PainSc: (P) 0-No pain                 Corinda Gubler

## 2023-06-13 ENCOUNTER — Ambulatory Visit (INDEPENDENT_AMBULATORY_CARE_PROVIDER_SITE_OTHER): Payer: Medicare Other | Admitting: Physician Assistant

## 2023-06-13 VITALS — BP 118/74 | HR 69 | Ht 72.0 in | Wt 180.0 lb

## 2023-06-13 DIAGNOSIS — N138 Other obstructive and reflux uropathy: Secondary | ICD-10-CM

## 2023-06-13 DIAGNOSIS — N401 Enlarged prostate with lower urinary tract symptoms: Secondary | ICD-10-CM

## 2023-06-13 NOTE — Progress Notes (Signed)
Catheter Removal  Patient is present today for a catheter removal.  58ml of water was drained from the balloon. A 24FR 3-way foley cath was removed from the bladder, no complications were noted. Patient tolerated well.  Performed by: Carman Ching, PA-C   Additional notes: Counseled patient on normal postoperative findings including dysuria, gross hematuria, and urinary urgency/leakage. Counseled patient to begin Kegel exercises 3x10 sets daily to increase urinary control and wear absorbent products as needed for security. Written and verbal resources provided today. Surgical pathology pending; will defer to Dr. Richardo Hanks to share results when available.   Follow up/ Additional notes: Return in about 4 months (around 10/11/2023) for Postop f/u with Dr. Richardo Hanks.

## 2023-06-13 NOTE — Patient Instructions (Signed)

## 2023-06-14 LAB — SURGICAL PATHOLOGY

## 2023-06-17 ENCOUNTER — Encounter: Payer: Self-pay | Admitting: Urology

## 2023-06-17 ENCOUNTER — Encounter: Payer: Self-pay | Admitting: Oncology

## 2023-07-04 ENCOUNTER — Encounter: Payer: Self-pay | Admitting: Oncology

## 2023-07-06 ENCOUNTER — Inpatient Hospital Stay: Payer: Medicare Other

## 2023-07-06 ENCOUNTER — Encounter: Payer: Self-pay | Admitting: Radiation Oncology

## 2023-07-06 ENCOUNTER — Ambulatory Visit
Admission: RE | Admit: 2023-07-06 | Discharge: 2023-07-06 | Disposition: A | Discharge status: Home / Self Care | Payer: Medicare Other | Source: Ambulatory Visit | Attending: Radiation Oncology | Admitting: Radiation Oncology

## 2023-07-06 ENCOUNTER — Other Ambulatory Visit: Payer: Self-pay | Admitting: Radiation Oncology

## 2023-07-06 ENCOUNTER — Telehealth: Payer: Self-pay

## 2023-07-06 ENCOUNTER — Encounter: Payer: Self-pay | Admitting: Oncology

## 2023-07-06 ENCOUNTER — Other Ambulatory Visit: Payer: Self-pay | Admitting: Oncology

## 2023-07-06 ENCOUNTER — Inpatient Hospital Stay: Payer: Medicare Other | Admitting: Oncology

## 2023-07-06 ENCOUNTER — Inpatient Hospital Stay: Payer: Medicare Other | Attending: Oncology

## 2023-07-06 VITALS — BP 134/74 | HR 70 | Temp 98.1°F | Resp 16 | Wt 185.0 lb

## 2023-07-06 DIAGNOSIS — C801 Malignant (primary) neoplasm, unspecified: Secondary | ICD-10-CM

## 2023-07-06 DIAGNOSIS — C679 Malignant neoplasm of bladder, unspecified: Secondary | ICD-10-CM | POA: Diagnosis not present

## 2023-07-06 DIAGNOSIS — N200 Calculus of kidney: Secondary | ICD-10-CM | POA: Insufficient documentation

## 2023-07-06 DIAGNOSIS — C680 Malignant neoplasm of urethra: Secondary | ICD-10-CM | POA: Insufficient documentation

## 2023-07-06 NOTE — Assessment & Plan Note (Addendum)
Urothelial carcinoma of prostatic urethra/bladder neck, cT2, muscle invasive.  He is not a candidate for radical cystectomy.  Recommend concurrent chemotherapy with radiation. Obtain CT chest to complete staging.[name]  He has severe hearing loss of right ear, and moderate hearing loss of left ear. Cisplatin is not an ideal chemotherapy choice.  Recommend bi weekly Gemcitabine 27mg /m2 concurrently with radiation.  Labs are reviewed and discussed with patient. Proceed with  Gemcitabine 27mg /m2

## 2023-07-06 NOTE — Telephone Encounter (Signed)
Pt scheduled for first treatment today, however he is suppose to have concurrent chemo RT and he is not scheduled for radiation tx yet. He is only scheduled to see Dr. Rushie Chestnut today. Called and spoke to pt and wife and informed them that they do not need to come to today's treatment appt and once we find out the date of first radation tx, they hewill be contacted with chemo appts. Pt and wife verbalized understading. They will keep appt with Dr. Marena Chancy this afternoon.

## 2023-07-06 NOTE — Progress Notes (Signed)
Radiation Oncology Follow up Note  Name: [name]   Date:   07/06/2023 MRN:  [id number] DOB: [birth year]    This 88 y.o. male presents to the clinic today for follow-up in patient with high-grade urethral carcinoma the prostatic urethra stage II (pT2b N0 M0) nonsurgical candidate.  REFERRING PROVIDER: Karie Schwalbe, MD  HPI: Patient is an 88 year old male originally consulted back in October when he presented with hematuria.  Workup showed a nonobstructing punctate lower left renal stone with no hydronephrosis.  There was bladder wall thickening and trabeculation cystoscopy showed shaggy abnormal appearing tissue in the prostatic urethra and the bladder neck which was resected showing high-grade urethral carcinoma with muscle invasion.  We recommended concurrent chemoradiation therapy.  He will receive gemcitabine chemotherapy concurrently under medical oncology's direction.  He has recently undergone.HoLEP (Holmium Laser Enucleation of the Prostate) which he tolerated well.  He states his urinary flow is satisfactory at the present time.  Patient had a CT scan back in early November again showing mild wall thickening with enhancement along the left posterior bladder.  COMPLICATIONS OF TREATMENT: none  FOLLOW UP COMPLIANCE: keeps appointments   PHYSICAL EXAM:  BP 134/74   Pulse 70   Temp 98.1 F (36.7 C) (Tympanic)   Resp 16   Wt 185 lb (83.9 kg)   BMI 25.09 kg/m  Well-developed well-nourished patient in NAD. HEENT reveals PERLA, EOMI, discs not visualized.  Oral cavity is clear. No oral mucosal lesions are identified. Neck is clear without evidence of cervical or supraclavicular adenopathy. Lungs are clear to A&P. Cardiac examination is essentially unremarkable with regular rate and rhythm without murmur rub or thrill. Abdomen is benign with no organomegaly or masses noted. Motor sensory and DTR levels are equal and symmetric in the upper and lower extremities. Cranial nerves II  through XII are grossly intact. Proprioception is intact. No peripheral adenopathy or edema is identified. No motor or sensory levels are noted. Crude visual fields are within normal range.  RADIOLOGY RESULTS: CT scan reviewed compatible with above-stated findings  PLAN: At this time would like to goAhead with radiation therapy.  Would plan on delivering 65 Gray in 35 fractions using IMRT treatment planning and delivery.  Will also coordinate with medical oncology for his gemcitabine chemotherapy.  There will be extra effort by both professional staff as well as technical staff to coordinate and manage concurrent chemoradiation and ensuing side effects during his treatments. Risks and benefits of treatment including increased lower and retract symptoms possible diarrhea fatigue alteration of blood counts all reviewed in detail with the patient and his daughter.  They both comprehend my treatment plan well.  We have personally set up and ordered CT simulation for next week.  I would like to take this opportunity to thank you for allowing me to participate in the care of your patient.Carmina Miller, MD

## 2023-07-07 ENCOUNTER — Ambulatory Visit (INDEPENDENT_AMBULATORY_CARE_PROVIDER_SITE_OTHER): Payer: Medicare Other | Admitting: Internal Medicine

## 2023-07-07 ENCOUNTER — Encounter: Payer: Self-pay | Admitting: Oncology

## 2023-07-07 ENCOUNTER — Ambulatory Visit
Admission: RE | Admit: 2023-07-07 | Discharge: 2023-07-07 | Disposition: A | Discharge status: Home / Self Care | Payer: Medicare Other | Source: Ambulatory Visit | Attending: Internal Medicine | Admitting: Internal Medicine

## 2023-07-07 ENCOUNTER — Encounter: Payer: Self-pay | Admitting: Internal Medicine

## 2023-07-07 ENCOUNTER — Telehealth: Payer: Self-pay | Admitting: *Deleted

## 2023-07-07 VITALS — BP 134/78 | HR 52 | Temp 98.2°F | Ht 72.0 in | Wt 183.0 lb

## 2023-07-07 DIAGNOSIS — I82412 Acute embolism and thrombosis of left femoral vein: Secondary | ICD-10-CM | POA: Diagnosis not present

## 2023-07-07 DIAGNOSIS — C679 Malignant neoplasm of bladder, unspecified: Secondary | ICD-10-CM | POA: Insufficient documentation

## 2023-07-07 DIAGNOSIS — I82402 Acute embolism and thrombosis of unspecified deep veins of left lower extremity: Secondary | ICD-10-CM | POA: Insufficient documentation

## 2023-07-07 DIAGNOSIS — M7989 Other specified soft tissue disorders: Secondary | ICD-10-CM | POA: Insufficient documentation

## 2023-07-07 DIAGNOSIS — M79662 Pain in left lower leg: Secondary | ICD-10-CM | POA: Diagnosis not present

## 2023-07-07 DIAGNOSIS — I82432 Acute embolism and thrombosis of left popliteal vein: Secondary | ICD-10-CM | POA: Diagnosis not present

## 2023-07-07 MED ORDER — APIXABAN (ELIQUIS) VTE STARTER PACK (10MG AND 5MG): ORAL_TABLET | ORAL | 0 refills | Status: DC

## 2023-07-07 NOTE — Telephone Encounter (Addendum)
[place] dpt called to confirm pt was + for DVT. They are letting him go but letting pt know we are calling him.  I advise Dr. Alphonsus Sias of + DVT. He told me to confirm pharmacy because he is going to send in blood thinner med and also schedule f/u appt with pt:   [name] (on DPR) and advised her of results. F/u appt scheduled for Monday 07/11/23. Pt is using [facility].  Cordelia Pen did have a question pt is already on Plavix so she wasn't sure if the new med will be added to the Plavix or does he need to stop the Plavix and start the med med.**  Please call Cordelia Pen back at [phone]

## 2023-07-07 NOTE — Addendum Note (Signed)
Addended by: Tillman Abide I on: 07/07/2023 12:37 PM   Modules accepted: Orders

## 2023-07-07 NOTE — Addendum Note (Signed)
Addended by: Tillman Abide I on: 07/07/2023 12:34 PM   Modules accepted: Orders

## 2023-07-07 NOTE — Assessment & Plan Note (Signed)
Ongoing work up and plan for RT and chemo

## 2023-07-07 NOTE — Telephone Encounter (Signed)
Patient has been schedule for first tx on 12/30. Pt has been notified of appts.

## 2023-07-07 NOTE — Telephone Encounter (Signed)
Spoke to pt's wife per DPR. Advised her to have him stop the Plavix for now. I will remove it from his med list. .

## 2023-07-07 NOTE — Assessment & Plan Note (Signed)
Highly suspicious for DVT with recent cancer diagnosis Will set up stat duplex---and plan anticoagulation if positive

## 2023-07-07 NOTE — Telephone Encounter (Signed)
Also called Cordelia Pen and advised her of Dr. Karle Starch comments and also gave her ER precautions

## 2023-07-07 NOTE — Assessment & Plan Note (Addendum)
Report back--does have DVT in femoral, common femoral, etc Will start eliquis 10 bid with starter pack Plan follow up with me early next week Stop plavix

## 2023-07-07 NOTE — Addendum Note (Signed)
Addended by: Tillman Abide I on: 07/07/2023 12:34 PM   Modules accepted: Level of Service
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
## 2023-07-08 ENCOUNTER — Ambulatory Visit: Payer: Medicare Other

## 2023-07-11 ENCOUNTER — Encounter: Payer: Self-pay | Admitting: Internal Medicine

## 2023-07-11 ENCOUNTER — Encounter: Payer: Self-pay | Admitting: Oncology

## 2023-07-11 ENCOUNTER — Ambulatory Visit (INDEPENDENT_AMBULATORY_CARE_PROVIDER_SITE_OTHER): Payer: Medicare Other | Admitting: Internal Medicine

## 2023-07-11 VITALS — BP 120/84 | HR 72 | Temp 98.3°F | Ht 72.0 in | Wt 186.0 lb

## 2023-07-11 DIAGNOSIS — I824Y2 Acute embolism and thrombosis of unspecified deep veins of left proximal lower extremity: Secondary | ICD-10-CM

## 2023-07-11 DIAGNOSIS — R310 Gross hematuria: Secondary | ICD-10-CM | POA: Diagnosis not present

## 2023-07-11 LAB — POC URINALSYSI DIPSTICK (AUTOMATED)
Bilirubin, UA: NEGATIVE
Glucose, UA: NEGATIVE
Ketones, UA: NEGATIVE
Nitrite, UA: NEGATIVE
Protein, UA: POSITIVE — AB
Spec Grav, UA: 1.025 (ref 1.010–1.025)
Urobilinogen, UA: 1 U/dL
pH, UA: 5.5 (ref 5.0–8.0)

## 2023-07-11 MED ORDER — APIXABAN 5 MG PO TABS: 5.0000 mg | ORAL_TABLET | Freq: Two times a day (BID) | ORAL | 5 refills | Status: DC

## 2023-07-11 NOTE — Assessment & Plan Note (Addendum)
Still has some left calf discomfort--but exam shows improvement Had red urine (but no clots) since 1 day after starting the eliquis Will cut him to the 5mg  bid right now and continue (unless the bleeding gets worse). Continue follow up with urology and oncology--will send my notes  Will need to continue while still on active cancer treatment Will defer these decisions to Dr Cathie Hoops though

## 2023-07-11 NOTE — Assessment & Plan Note (Signed)
Likely from cancer/biopsy site Mild terminal dysuria---urinalysis does show 3+ leuks and blood Will only treat if culture positive

## 2023-07-11 NOTE — Patient Instructions (Signed)
Please reduce the apixaban to 1 tab twice a day right now--- (5mg ) Let me know if you continue to have significant blood in your urine
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
## 2023-07-12 LAB — URINE CULTURE
MICRO NUMBER:: 15825814
SPECIMEN QUALITY:: ADEQUATE

## 2023-07-13 ENCOUNTER — Other Ambulatory Visit: Payer: Medicare Other

## 2023-07-13 ENCOUNTER — Ambulatory Visit: Payer: Medicare Other

## 2023-07-13 ENCOUNTER — Ambulatory Visit: Payer: Medicare Other | Admitting: Oncology

## 2023-07-13 NOTE — Progress Notes (Signed)
That is good news. He really does need to continue the eliquis

## 2023-07-15 ENCOUNTER — Ambulatory Visit: Payer: Medicare Other

## 2023-07-18 ENCOUNTER — Other Ambulatory Visit: Payer: Self-pay | Admitting: Internal Medicine

## 2023-07-19 ENCOUNTER — Ambulatory Visit
Admission: RE | Admit: 2023-07-19 | Discharge: 2023-07-19 | Disposition: A | Discharge status: Home / Self Care | Payer: Medicare Other | Source: Ambulatory Visit | Attending: Radiation Oncology | Admitting: Radiation Oncology

## 2023-07-19 DIAGNOSIS — Z510 Encounter for antineoplastic radiation therapy: Secondary | ICD-10-CM | POA: Diagnosis not present

## 2023-07-19 DIAGNOSIS — C680 Malignant neoplasm of urethra: Secondary | ICD-10-CM | POA: Insufficient documentation

## 2023-07-19 DIAGNOSIS — C801 Malignant (primary) neoplasm, unspecified: Secondary | ICD-10-CM

## 2023-07-19 DIAGNOSIS — C675 Malignant neoplasm of bladder neck: Secondary | ICD-10-CM | POA: Diagnosis not present

## 2023-07-19 DIAGNOSIS — Z5111 Encounter for antineoplastic chemotherapy: Secondary | ICD-10-CM | POA: Diagnosis not present

## 2023-07-19 NOTE — Progress Notes (Signed)
Pharmacist Chemotherapy Monitoring - Initial Assessment    Anticipated start date: 08/01/23   The following has been reviewed per standard work regarding the patient's treatment regimen: The patient's diagnosis, treatment plan and drug doses, and organ/hematologic function Lab orders and baseline tests specific to treatment regimen  The treatment plan start date, drug sequencing, and pre-medications Prior authorization status  Patient's documented medication list, including drug-drug interaction screen and prescriptions for anti-emetics and supportive care specific to the treatment regimen The drug concentrations, fluid compatibility, administration routes, and timing of the medications to be used The patient's access for treatment and lifetime cumulative dose history, if applicable  The patient's medication allergies and previous infusion related reactions, if applicable   Changes made to treatment plan:  N/A  Follow up needed:  N/A   Sharen Hones, PharmD, BCPS Clinical Pharmacist   07/19/2023  11:53 AM

## 2023-07-20 ENCOUNTER — Ambulatory Visit: Payer: Medicare Other | Attending: Internal Medicine | Admitting: Internal Medicine

## 2023-07-20 ENCOUNTER — Encounter: Payer: Self-pay | Admitting: Internal Medicine

## 2023-07-20 VITALS — BP 120/60 | HR 49 | Ht 72.0 in | Wt 187.0 lb

## 2023-07-20 DIAGNOSIS — Z79899 Other long term (current) drug therapy: Secondary | ICD-10-CM | POA: Diagnosis not present

## 2023-07-20 DIAGNOSIS — R001 Bradycardia, unspecified: Secondary | ICD-10-CM | POA: Diagnosis not present

## 2023-07-20 DIAGNOSIS — I2510 Atherosclerotic heart disease of native coronary artery without angina pectoris: Secondary | ICD-10-CM | POA: Insufficient documentation

## 2023-07-20 DIAGNOSIS — I10 Essential (primary) hypertension: Secondary | ICD-10-CM | POA: Diagnosis not present

## 2023-07-20 DIAGNOSIS — I255 Ischemic cardiomyopathy: Secondary | ICD-10-CM | POA: Insufficient documentation

## 2023-07-20 DIAGNOSIS — C675 Malignant neoplasm of bladder neck: Secondary | ICD-10-CM | POA: Diagnosis not present

## 2023-07-20 DIAGNOSIS — I5022 Chronic systolic (congestive) heart failure: Secondary | ICD-10-CM | POA: Insufficient documentation

## 2023-07-20 DIAGNOSIS — Z510 Encounter for antineoplastic radiation therapy: Secondary | ICD-10-CM | POA: Diagnosis not present

## 2023-07-20 DIAGNOSIS — C680 Malignant neoplasm of urethra: Secondary | ICD-10-CM | POA: Diagnosis not present

## 2023-07-20 DIAGNOSIS — Z5111 Encounter for antineoplastic chemotherapy: Secondary | ICD-10-CM | POA: Diagnosis not present

## 2023-07-20 NOTE — Patient Instructions (Signed)
Medication Instructions:  Your physician recommends the following medication changes.  STOP TAKING: Metoprolol   *If you need a refill on your cardiac medications before your next appointment, please call your pharmacy*   Lab Work: Your provider would like for you to have following labs drawn today (CBC).     Testing/Procedures: No test ordered today    Follow-Up: At [facility], you and your health needs are our priority.  As part of our continuing mission to provide you with exceptional heart care, we have created designated Provider Care Teams.  These Care Teams include your primary Cardiologist (physician) and Advanced Practice Providers (APPs -  Physician Assistants and Nurse Practitioners) who all work together to provide you with the care you need, when you need it.  We recommend signing up for the patient portal called "MyChart".  Sign up information is provided on this After Visit Summary.  MyChart is used to connect with patients for Virtual Visits (Telemedicine).  Patients are able to view lab/test results, encounter notes, upcoming appointments, etc.  Non-urgent messages can be sent to your provider as well.   To learn more about what you can do with MyChart, go to [email or web address].    Your next appointment:   6 month(s)  Provider:   You may see Yvonne Kendall, MD or one of the following Advanced Practice Providers on your designated Care Team:   Nicolasa Ducking, NP Eula Listen, PA-C Cadence Fransico Michael, PA-C Charlsie Quest, NP Carlos Levering, NP

## 2023-07-20 NOTE — Progress Notes (Signed)
Cardiology Office Note:  .   Date:  07/20/2023  ID:  [name], DOB 1935-02-10, MRN [id number] PCP: Karie Schwalbe, MD  [facility] Providers Cardiologist:  Yvonne Kendall, MD     History of Present Illness: .   [name] is a 88 y.o. male with history of coronary artery disease status post STEMI with primary PCI proximal LAD (11/2021), hypertension, hyperlipidemia, HFrEF due to ischemic cardiomyopathy, DVT, and BPH, and bladder cancer, who presents for follow-up of coronary artery disease and HFrEF.  He was last seen in our office in June, at which time he was feeling well.  No medication changes or additional testing were pursued.  He was recently diagnosed with a left lower extremity DVT and is currently on apixaban in the lieu of clopidogrel.  His DVT treatment has been complicated by hematuria, which has improved though he still notes occasional blood-tinged urine as well as clots when voiding.  He is not scheduled to follow-up with urology again until March.  From a heart standpoint, [name] reports that he has been doing well without chest pain, shortness of breath, palpitations, or lightheadedness.  Left lower extremity swelling associated with his DVT has improved, though it is still a little swollen and intermittently painful.  He is currently on apixaban 5 mg twice daily.  ROS: See HPI  Studies Reviewed: Marland Kitchen   EKG Interpretation Date/Time:  Wednesday July 20 2023 09:59:08 EST Ventricular Rate:  49 PR Interval:  202 QRS Duration:  148 QT Interval:  478 QTC Calculation: 431 R Axis:   -73  Text Interpretation: Sinus bradycardia with Premature atrial complexes Right bundle branch block Left anterior fasicular block Bifascicular block Septal infarct (cited on or before 06-Jun-2023) When compared with ECG of 06-Jun-2023 14:57, Premature atrial complexes are now Present No significant change was found Confirmed by Chastidy Ranker, Cristal Deer [phone]) on 07/20/2023 10:05:33  AM    Risk Assessment/Calculations:             Physical Exam:   VS:  BP 120/60 (BP Location: Left Arm, Patient Position: Sitting)   Pulse (!) 49   Ht 6' (1.829 m)   Wt 187 lb (84.8 kg)   SpO2 97%   BMI 25.36 kg/m    Wt Readings from Last 3 Encounters:  07/20/23 187 lb (84.8 kg)  07/11/23 186 lb (84.4 kg)  07/07/23 183 lb (83 kg)    General:  NAD.  Accompanied by his daughter-in-law. Neck: No JVD or HJR. Lungs: Mildly diminished breath sounds throughout without wheezes or crackles. Heart: Regular rate and rhythm without murmurs, rubs, or gallops. Abdomen: Soft, nontender, nondistended. Extremities: 1+ left calf edema.  ASSESSMENT AND PLAN: .    Coronary artery disease: No angina reported.  Continue current regimen for secondary prevention, including apixaban and lieu of clopidogrel given acute DVT.  When course of anticoagulation has been completed, [name] should be switched back to clopidogrel 75 mg daily.  Chronic HFrEF due to ischemic cardiomyopathy: LVEF mildly reduced on most recent echo at 45-50%.  Other than left lower extremity edema likely related to acute DVT, [name] appears euvolemic.  Due to sinus bradycardia first-degree AV block, we have agreed to discontinue metoprolol.  Continue losartan.  Sinus bradycardia and first-degree AV block: Though [name] is asymptomatic, I have recommended discontinuation of metoprolol to minimize the risk for lightheadedness and falls.  Left lower extremity DVT: Continue apixaban at the direction of Dr. Alphonsus Dunn.  Bladder cancer and  hematuria: Overall, this has improved though [name] notes some continued intermittent hematuria.  This is likely exacerbated by ongoing anticoagulation for his recently diagnosed left lower extremity DVT.  I encouraged him to reach out to urology for further directions.  I will check a CBC today to ensure that he is not developing significant anemia.  Hyperlipidemia: Continue atorvastatin 40  mg daily.  Lipids at goal on last check in 11/2022.    Dispo: Return to clinic in 6 months.  Signed, Yvonne Kendall, MD

## 2023-07-21 ENCOUNTER — Telehealth: Payer: Self-pay

## 2023-07-21 ENCOUNTER — Telehealth: Payer: Self-pay | Admitting: Internal Medicine

## 2023-07-21 LAB — CBC
Hematocrit: 38.1 % (ref 37.5–51.0)
Hemoglobin: 12.6 g/dL — ABNORMAL LOW (ref 13.0–17.7)
MCH: 29.4 pg (ref 26.6–33.0)
MCHC: 33.1 g/dL (ref 31.5–35.7)
MCV: 89 fL (ref 79–97)
Platelets: 241 10*3/uL (ref 150–450)
RBC: 4.28 x10E6/uL (ref 4.14–5.80)
RDW: 12 % (ref 11.6–15.4)
WBC: 6.1 10*3/uL (ref 3.4–10.8)

## 2023-07-21 NOTE — Telephone Encounter (Signed)
Spoke to pt's wife. It sounds like it requires a PA but I do not see anything in the chart that we received anything from them. I will work on it tomorrow.

## 2023-07-21 NOTE — Telephone Encounter (Signed)
Patient made aware of results and verbalized understanding.  Patient asked that we also leave a message on his answering machine. This has been completed.      07/21/23  2:28 PM Result Note Please let Mr. Grounds know that his hemoglobin is down slightly compared to October but similar to September.  Given his continued intermittent hematuria, I recommend that he reach out to Dr. Alphonsus Sias and Dr. Richardo Hanks for further guidance in the setting of recent DVT with ongoing anticoagulation.  I will forward the results to them as well.

## 2023-07-21 NOTE — Telephone Encounter (Signed)
Copied from CRM [id number]. Topic: Clinical - Prescription Issue >> Jul 21, 2023  4:18 PM Elizebeth Brooking wrote: Reason for CRM: Patient called in regard to medication apixaban (ELIQUIS) 5 MG TABS , patient got a letter from their pharmacy stating that they are unable to fill prescription. Patient is requesting the prescription to be sent back to pharmacy

## 2023-07-21 NOTE — Telephone Encounter (Signed)
Patient is returning call about his results

## 2023-07-22 ENCOUNTER — Ambulatory Visit: Payer: Medicare Other | Admitting: Physician Assistant

## 2023-07-22 ENCOUNTER — Telehealth: Payer: Self-pay

## 2023-07-22 MED ORDER — APIXABAN 5 MG PO TABS: 5.0000 mg | ORAL_TABLET | Freq: Two times a day (BID) | ORAL | 5 refills | Status: DC

## 2023-07-22 NOTE — Telephone Encounter (Signed)
Pt informed, voiced understanding

## 2023-07-22 NOTE — Telephone Encounter (Signed)
Spoke to pt's wife. Dr PA on CoverMyMeds and it states no PA is required. I

## 2023-07-22 NOTE — Telephone Encounter (Signed)
Pts wife called  Merdis Delay-   Wife states he had surgery 3+ weeks ago. + for blood on urine since surgery.  Holep with [employer] on 11/8.   Pinkish in color  Not every time he urinates.  Burns on/off at the end of urination  + hydration No fevers or pain   Started Eliquis- 12/9

## 2023-07-22 NOTE — Addendum Note (Signed)
Addended by: Eual Fines on: 07/22/2023 05:41 PM   Modules accepted: Orders

## 2023-07-22 NOTE — Telephone Encounter (Signed)
Being on blood thinners can prolong postop bleeding after HOLEP. As long as he's not urinating thick, ketchup-like urine or passing large clots, he should just stay hydrated and it'll resolve on its own over time. Keep postop f/u with Dr. Richardo Hanks.

## 2023-07-25 NOTE — Telephone Encounter (Signed)
Spoke to pt's wife. [facility] will be sending him the Eliquis. So it was the Plavix that we had responded to them about.

## 2023-07-25 NOTE — Telephone Encounter (Signed)
Left message on verified VM for pt and wife that it came to me over the weekend that I think the issue with the mail order pharmacy was that they had contacted [place] to see if he was taking Plavix while taking Eliquis. I faxed something back telling them no and they also called the office and were advised he was not. That is the only thing I could think of that was delaying the medication since a PA was not required.

## 2023-07-28 ENCOUNTER — Encounter: Payer: Self-pay | Admitting: Oncology

## 2023-07-28 ENCOUNTER — Ambulatory Visit: Admission: RE | Admit: 2023-07-28 | Payer: Medicare Other | Source: Ambulatory Visit

## 2023-07-28 ENCOUNTER — Ambulatory Visit
Admission: RE | Admit: 2023-07-28 | Discharge: 2023-07-28 | Disposition: A | Discharge status: Home / Self Care | Payer: Medicare Other | Source: Ambulatory Visit | Attending: Radiation Oncology | Admitting: Radiation Oncology

## 2023-07-28 DIAGNOSIS — C680 Malignant neoplasm of urethra: Secondary | ICD-10-CM | POA: Diagnosis not present

## 2023-07-28 DIAGNOSIS — Z510 Encounter for antineoplastic radiation therapy: Secondary | ICD-10-CM | POA: Diagnosis not present

## 2023-07-28 DIAGNOSIS — Z5111 Encounter for antineoplastic chemotherapy: Secondary | ICD-10-CM | POA: Diagnosis not present

## 2023-07-28 DIAGNOSIS — C675 Malignant neoplasm of bladder neck: Secondary | ICD-10-CM | POA: Diagnosis not present

## 2023-08-01 ENCOUNTER — Encounter: Payer: Self-pay | Admitting: Oncology

## 2023-08-01 ENCOUNTER — Inpatient Hospital Stay: Payer: Medicare Other

## 2023-08-01 ENCOUNTER — Inpatient Hospital Stay (HOSPITAL_BASED_OUTPATIENT_CLINIC_OR_DEPARTMENT_OTHER): Payer: Medicare Other | Admitting: Oncology

## 2023-08-01 ENCOUNTER — Other Ambulatory Visit: Payer: Self-pay

## 2023-08-01 ENCOUNTER — Ambulatory Visit
Admission: RE | Admit: 2023-08-01 | Discharge: 2023-08-01 | Disposition: A | Discharge status: Home / Self Care | Payer: Medicare Other | Source: Ambulatory Visit | Attending: Radiation Oncology | Admitting: Radiation Oncology

## 2023-08-01 VITALS — BP 135/75 | HR 59 | Temp 97.0°F | Resp 18 | Ht 72.0 in | Wt 184.5 lb

## 2023-08-01 VITALS — BP 150/69 | HR 56 | Resp 18

## 2023-08-01 DIAGNOSIS — R300 Dysuria: Secondary | ICD-10-CM

## 2023-08-01 DIAGNOSIS — Z5111 Encounter for antineoplastic chemotherapy: Secondary | ICD-10-CM | POA: Insufficient documentation

## 2023-08-01 DIAGNOSIS — C675 Malignant neoplasm of bladder neck: Secondary | ICD-10-CM | POA: Insufficient documentation

## 2023-08-01 DIAGNOSIS — C680 Malignant neoplasm of urethra: Secondary | ICD-10-CM | POA: Diagnosis not present

## 2023-08-01 DIAGNOSIS — C689 Malignant neoplasm of urinary organ, unspecified: Secondary | ICD-10-CM

## 2023-08-01 DIAGNOSIS — Z510 Encounter for antineoplastic radiation therapy: Secondary | ICD-10-CM | POA: Diagnosis not present

## 2023-08-01 LAB — RAD ONC ARIA SESSION SUMMARY
Course Elapsed Days: 0
Plan Fractions Treated to Date: 1
Plan Prescribed Dose Per Fraction: 1.8 Gy
Plan Total Fractions Prescribed: 25
Plan Total Prescribed Dose: 45 Gy
Reference Point Dosage Given to Date: 1.8 Gy
Reference Point Session Dosage Given: 1.8 Gy
Session Number: 1

## 2023-08-01 LAB — CBC WITH DIFFERENTIAL (CANCER CENTER ONLY)
Abs Immature Granulocytes: 0.01 10*3/uL (ref 0.00–0.07)
Basophils Absolute: 0.1 10*3/uL (ref 0.0–0.1)
Basophils Relative: 1 %
Eosinophils Absolute: 0.4 10*3/uL (ref 0.0–0.5)
Eosinophils Relative: 6 %
HCT: 38.4 % — ABNORMAL LOW (ref 39.0–52.0)
Hemoglobin: 12.5 g/dL — ABNORMAL LOW (ref 13.0–17.0)
Immature Granulocytes: 0 %
Lymphocytes Relative: 24 %
Lymphs Abs: 1.4 10*3/uL (ref 0.7–4.0)
MCH: 29.6 pg (ref 26.0–34.0)
MCHC: 32.6 g/dL (ref 30.0–36.0)
MCV: 90.8 fL (ref 80.0–100.0)
Monocytes Absolute: 0.5 10*3/uL (ref 0.1–1.0)
Monocytes Relative: 9 %
Neutro Abs: 3.5 10*3/uL (ref 1.7–7.7)
Neutrophils Relative %: 60 %
Platelet Count: 169 10*3/uL (ref 150–400)
RBC: 4.23 MIL/uL (ref 4.22–5.81)
RDW: 13.8 % (ref 11.5–15.5)
WBC Count: 5.8 10*3/uL (ref 4.0–10.5)
nRBC: 0 % (ref 0.0–0.2)

## 2023-08-01 LAB — CMP (CANCER CENTER ONLY)
ALT: 14 U/L (ref 0–44)
AST: 24 U/L (ref 15–41)
Albumin: 3.4 g/dL — ABNORMAL LOW (ref 3.5–5.0)
Alkaline Phosphatase: 99 U/L (ref 38–126)
Anion gap: 8 (ref 5–15)
BUN: 16 mg/dL (ref 8–23)
CO2: 25 mmol/L (ref 22–32)
Calcium: 9.2 mg/dL (ref 8.9–10.3)
Chloride: 107 mmol/L (ref 98–111)
Creatinine: 0.82 mg/dL (ref 0.61–1.24)
GFR, Estimated: >60 mL/min (ref >60)
Glucose, Bld: 119 mg/dL — ABNORMAL HIGH (ref 70–99)
Potassium: 3.7 mmol/L (ref 3.5–5.1)
Sodium: 140 mmol/L (ref 135–145)
Total Bilirubin: 0.7 mg/dL (ref <1.2)
Total Protein: 7.1 g/dL (ref 6.5–8.1)

## 2023-08-01 MED ORDER — PROCHLORPERAZINE MALEATE 10 MG PO TABS
10.0000 mg | ORAL_TABLET | Freq: Once | ORAL | Status: AC
  Administered 2023-08-01: 10 mg via ORAL
  Filled 2023-08-01: qty 1

## 2023-08-01 MED ORDER — SODIUM CHLORIDE 0.9 % IV SOLN
Freq: Once | INTRAVENOUS | Status: AC
  Filled 2023-08-01: qty 250

## 2023-08-01 MED ORDER — SODIUM CHLORIDE 0.9 % IV SOLN
27.0000 mg/m2 | Freq: Once | INTRAVENOUS | Status: AC
  Administered 2023-08-01: 53.2 mg via INTRAVENOUS
  Filled 2023-08-01: qty 1.4

## 2023-08-01 NOTE — Assessment & Plan Note (Addendum)
Urothelial carcinoma of prostatic urethra/bladder neck, cT2, muscle invasive.  He is not a candidate for radical cystectomy.  Recommend concurrent chemotherapy with radiation. Obtain CT chest to complete staging.Marland Kitchen  He has severe hearing loss of right ear, and moderate hearing loss of left ear. Cisplatin is not an ideal chemotherapy choice.  Recommend bi weekly Gemcitabine 27mg /m2 concurrently with radiation.  Labs are reviewed and discussed with patient. Proceed with  Gemcitabine 27mg /m2, follow up with Radonc for radiation.  He will get D4 Gemcitabine,
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
## 2023-08-01 NOTE — Patient Instructions (Signed)
[facility] - A [facility]PT OF [facility]  Discharge Instructions: Thank you for choosing [facility] to provide your oncology and hematology care.  If you have a lab appointment with the Cancer Center, please go directly to the Cancer Center and check in at the registration area.  Wear comfortable clothing and clothing appropriate for easy access to any Portacath or PICC line.   We strive to give you quality time with your provider. You may need to reschedule your appointment if you arrive late (15 or more minutes).  Arriving late affects you and other patients whose appointments are after yours.  Also, if you miss three or more appointments without notifying the office, you may be dismissed from the clinic at the provider's discretion.      For prescription refill requests, have your pharmacy contact our office and allow 72 hours for refills to be completed.    Today you received the following chemotherapy and/or immunotherapy agents gemcitabine    To help prevent nausea and vomiting after your treatment, we encourage you to take your nausea medication as directed.  BELOW ARE SYMPTOMS THAT SHOULD BE REPORTED IMMEDIATELY: *FEVER GREATER THAN 100.4 F (38 C) OR HIGHER *CHILLS OR SWEATING *NAUSEA AND VOMITING THAT IS NOT CONTROLLED WITH YOUR NAUSEA MEDICATION *UNUSUAL SHORTNESS OF BREATH *UNUSUAL BRUISING OR BLEEDING *URINARY PROBLEMS (pain or burning when urinating, or frequent urination) *BOWEL PROBLEMS (unusual diarrhea, constipation, pain near the anus) TENDERNESS IN MOUTH AND THROAT WITH OR WITHOUT PRESENCE OF ULCERS (sore throat, sores in mouth, or a toothache) UNUSUAL RASH, SWELLING OR PAIN  UNUSUAL VAGINAL DISCHARGE OR ITCHING   Items with * indicate a potential emergency and should be followed up as soon as possible or go to the Emergency Department if any problems should occur.  Please show the CHEMOTHERAPY ALERT CARD or IMMUNOTHERAPY  ALERT CARD at check-in to the Emergency Department and triage nurse.  Should you have questions after your visit or need to cancel or reschedule your appointment, please contact [facility] - A DEPT OF [facility]  [phone] and follow the prompts.  Office hours are 8:00 a.m. to 4:30 p.m. Monday - Friday. Please note that voicemails left after 4:00 p.m. may not be returned until the following business day.  We are closed weekends and major holidays. You have access to a nurse at all times for urgent questions. Please call the main number to the clinic [phone] and follow the prompts.  For any non-urgent questions, you may also contact your provider using MyChart. We now offer e-Visits for anyone 52 and older to request care online for non-urgent symptoms. For details visit [email or web address].   Also download the MyChart app! Go to the app store, search "MyChart", open the app, select [place], and log in with your MyChart username and password.  Gemcitabine Injection What is this medication? GEMCITABINE (jem SYE ta been) treats some types of cancer. It works by slowing down the growth of cancer cells. This medicine may be used for other purposes; ask your health care provider or pharmacist if you have questions. COMMON BRAND NAME(S): Gemzar, Infugem What should I tell my care team before I take this medication? They need to know if you have any of these conditions: Blood disorders Infection Kidney disease Liver disease Lung or breathing disease, such as asthma or COPD Recent or ongoing radiation therapy An unusual or allergic reaction to  gemcitabine, other medications, foods, dyes, or preservatives If you or your partner are pregnant or trying to get pregnant Breast-feeding How should I use this medication? This medication is injected into a vein. It is given by your care team in a hospital or clinic setting. Talk to your care team about the  use of this medication in children. Special care may be needed. Overdosage: If you think you have taken too much of this medicine contact a poison control center or emergency room at once. NOTE: This medicine is only for you. Do not share this medicine with others. What if I miss a dose? Keep appointments for follow-up doses. It is important not to miss your dose. Call your care team if you are unable to keep an appointment. What may interact with this medication? Interactions have not been studied. This list may not describe all possible interactions. Give your health care provider a list of all the medicines, herbs, non-prescription drugs, or dietary supplements you use. Also tell them if you smoke, drink alcohol, or use illegal drugs. Some items may interact with your medicine. What should I watch for while using this medication? Your condition will be monitored carefully while you are receiving this medication. This medication may make you feel generally unwell. This is not uncommon, as chemotherapy can affect healthy cells as well as cancer cells. Report any side effects. Continue your course of treatment even though you feel ill unless your care team tells you to stop. In some cases, you may be given additional medications to help with side effects. Follow all directions for their use. This medication may increase your risk of getting an infection. Call your care team for advice if you get a fever, chills, sore throat, or other symptoms of a cold or flu. Do not treat yourself. Try to avoid being around people who are sick. This medication may increase your risk to bruise or bleed. Call your care team if you notice any unusual bleeding. Be careful brushing or flossing your teeth or using a toothpick because you may get an infection or bleed more easily. If you have any dental work done, tell your dentist you are receiving this medication. Avoid taking medications that contain aspirin,  acetaminophen, ibuprofen, naproxen, or ketoprofen unless instructed by your care team. These medications may hide a fever. Talk to your care team if you or your partner wish to become pregnant or think you might be pregnant. This medication can cause serious birth defects if taken during pregnancy and for 6 months after the last dose. A negative pregnancy test is required before starting this medication. A reliable form of contraception is recommended while taking this medication and for 6 months after the last dose. Talk to your care team about effective forms of contraception. Do not father a child while taking this medication and for 3 months after the last dose. Use a condom while having sex during this time period. Do not breastfeed while taking this medication and for at least 1 week after the last dose. This medication may cause infertility. Talk to your care team if you are concerned about your fertility. What side effects may I notice from receiving this medication? Side effects that you should report to your care team as soon as possible: Allergic reactions--skin rash, itching, hives, swelling of the face, lips, tongue, or throat Capillary leak syndrome--stomach or muscle pain, unusual weakness or fatigue, feeling faint or lightheaded, decrease in the amount of urine, swelling of the ankles,  hands, or feet, trouble breathing Infection--fever, chills, cough, sore throat, wounds that don't heal, pain or trouble when passing urine, general feeling of discomfort or being unwell Liver injury--right upper belly pain, loss of appetite, nausea, light-colored stool, dark yellow or brown urine, yellowing skin or eyes, unusual weakness or fatigue Low red blood cell level--unusual weakness or fatigue, dizziness, headache, trouble breathing Lung injury--shortness of breath or trouble breathing, cough, spitting up blood, chest pain, fever Stomach pain, bloody diarrhea, pale skin, unusual weakness or fatigue,  decrease in the amount of urine, which may be signs of hemolytic uremic syndrome Sudden and severe headache, confusion, change in vision, seizures, which may be signs of posterior reversible encephalopathy syndrome (PRES) Unusual bruising or bleeding Side effects that usually do not require medical attention (report to your care team if they continue or are bothersome): Diarrhea Drowsiness Hair loss Nausea Pain, redness, or swelling with sores inside the mouth or throat Vomiting This list may not describe all possible side effects. Call your doctor for medical advice about side effects. You may report side effects to FDA at 1-800-FDA-1088. Where should I keep my medication? This medication is given in a hospital or clinic. It will not be stored at home. NOTE: This sheet is a summary. It may not cover all possible information. If you have questions about this medicine, talk to your doctor, pharmacist, or health care provider.  2024 Elsevier/Gold Standard (2021-11-24 00:00:00)

## 2023-08-01 NOTE — Assessment & Plan Note (Signed)
Baseline dysuria and mild hematuria.  Anticipate symptoms may exacerbate during radiation.  Recommend patient to follow up with urology.

## 2023-08-01 NOTE — Assessment & Plan Note (Signed)
Chemotherapy treatment as planned above.  

## 2023-08-02 ENCOUNTER — Telehealth: Payer: Self-pay

## 2023-08-02 ENCOUNTER — Ambulatory Visit
Admission: RE | Admit: 2023-08-02 | Discharge: 2023-08-02 | Disposition: A | Discharge status: Home / Self Care | Payer: Medicare Other | Source: Ambulatory Visit | Attending: Radiation Oncology | Admitting: Radiation Oncology

## 2023-08-02 ENCOUNTER — Other Ambulatory Visit: Payer: Self-pay

## 2023-08-02 DIAGNOSIS — Z5111 Encounter for antineoplastic chemotherapy: Secondary | ICD-10-CM | POA: Diagnosis not present

## 2023-08-02 DIAGNOSIS — C680 Malignant neoplasm of urethra: Secondary | ICD-10-CM | POA: Diagnosis not present

## 2023-08-02 DIAGNOSIS — C675 Malignant neoplasm of bladder neck: Secondary | ICD-10-CM | POA: Diagnosis not present

## 2023-08-02 DIAGNOSIS — Z510 Encounter for antineoplastic radiation therapy: Secondary | ICD-10-CM | POA: Diagnosis not present

## 2023-08-02 LAB — RAD ONC ARIA SESSION SUMMARY
Course Elapsed Days: 1
Plan Fractions Treated to Date: 2
Plan Prescribed Dose Per Fraction: 1.8 Gy
Plan Total Fractions Prescribed: 25
Plan Total Prescribed Dose: 45 Gy
Reference Point Dosage Given to Date: 3.6 Gy
Reference Point Session Dosage Given: 1.8 Gy
Session Number: 2

## 2023-08-02 NOTE — Telephone Encounter (Signed)
Telephone call made for follow up after receiving first chemo.   No answer and no voice mail set up.

## 2023-08-04 ENCOUNTER — Ambulatory Visit
Admission: RE | Admit: 2023-08-04 | Discharge: 2023-08-04 | Disposition: A | Discharge status: Home / Self Care | Payer: Medicare Other | Source: Ambulatory Visit | Attending: Radiation Oncology | Admitting: Radiation Oncology

## 2023-08-04 ENCOUNTER — Other Ambulatory Visit: Payer: Self-pay

## 2023-08-04 ENCOUNTER — Inpatient Hospital Stay: Payer: Medicare Other | Attending: Oncology

## 2023-08-04 VITALS — BP 150/69 | HR 62 | Temp 96.7°F | Resp 18

## 2023-08-04 DIAGNOSIS — C675 Malignant neoplasm of bladder neck: Secondary | ICD-10-CM | POA: Insufficient documentation

## 2023-08-04 DIAGNOSIS — C689 Malignant neoplasm of urinary organ, unspecified: Secondary | ICD-10-CM

## 2023-08-04 DIAGNOSIS — Z5111 Encounter for antineoplastic chemotherapy: Secondary | ICD-10-CM | POA: Insufficient documentation

## 2023-08-04 DIAGNOSIS — Z79899 Other long term (current) drug therapy: Secondary | ICD-10-CM | POA: Diagnosis not present

## 2023-08-04 DIAGNOSIS — C680 Malignant neoplasm of urethra: Secondary | ICD-10-CM | POA: Insufficient documentation

## 2023-08-04 DIAGNOSIS — Z86718 Personal history of other venous thrombosis and embolism: Secondary | ICD-10-CM | POA: Insufficient documentation

## 2023-08-04 DIAGNOSIS — Z510 Encounter for antineoplastic radiation therapy: Secondary | ICD-10-CM | POA: Insufficient documentation

## 2023-08-04 DIAGNOSIS — Z7901 Long term (current) use of anticoagulants: Secondary | ICD-10-CM | POA: Diagnosis not present

## 2023-08-04 DIAGNOSIS — R300 Dysuria: Secondary | ICD-10-CM | POA: Diagnosis not present

## 2023-08-04 LAB — RAD ONC ARIA SESSION SUMMARY
Course Elapsed Days: 3
Plan Fractions Treated to Date: 3
Plan Prescribed Dose Per Fraction: 1.8 Gy
Plan Total Fractions Prescribed: 25
Plan Total Prescribed Dose: 45 Gy
Reference Point Dosage Given to Date: 5.4 Gy
Reference Point Session Dosage Given: 1.8 Gy
Session Number: 3

## 2023-08-04 MED ORDER — SODIUM CHLORIDE 0.9 % IV SOLN
Freq: Once | INTRAVENOUS | Status: AC
  Filled 2023-08-04: qty 250

## 2023-08-04 MED ORDER — PROCHLORPERAZINE MALEATE 10 MG PO TABS
10.0000 mg | ORAL_TABLET | Freq: Once | ORAL | Status: AC
Start: 2023-08-04 — End: 2023-08-04
  Administered 2023-08-04: 10 mg via ORAL
  Filled 2023-08-04: qty 1

## 2023-08-04 MED ORDER — SODIUM CHLORIDE 0.9 % IV SOLN
27.0000 mg/m2 | Freq: Once | INTRAVENOUS | Status: AC
  Administered 2023-08-04: 53.2 mg via INTRAVENOUS
  Filled 2023-08-04: qty 1.4

## 2023-08-04 NOTE — Patient Instructions (Signed)
 [facility]  Discharge Instructions: Thank you for choosing [facility] to provide your oncology and hematology care.  If you have a lab appointment with the Cancer Center, please go directly to the Cancer Center and check in at the registration area.  Wear comfortable clothing and clothing appropriate for easy access to any Portacath or PICC line.   We strive to give you quality time with your provider. You may need to reschedule your appointment if you arrive late (15 or more minutes).  Arriving late affects you and other patients whose appointments are after yours.  Also, if you miss three or more appointments without notifying the office, you may be dismissed from the clinic at the provider's discretion.      For prescription refill requests, have your pharmacy contact our office and allow 72 hours for refills to be completed.    Today you received the following chemotherapy and/or immunotherapy agents GEMZAR      To help prevent nausea and vomiting after your treatment, we encourage you to take your nausea medication as directed.  BELOW ARE SYMPTOMS THAT SHOULD BE REPORTED IMMEDIATELY: *FEVER GREATER THAN 100.4 F (38 C) OR HIGHER *CHILLS OR SWEATING *NAUSEA AND VOMITING THAT IS NOT CONTROLLED WITH YOUR NAUSEA MEDICATION *UNUSUAL SHORTNESS OF BREATH *UNUSUAL BRUISING OR BLEEDING *URINARY PROBLEMS (pain or burning when urinating, or frequent urination) *BOWEL PROBLEMS (unusual diarrhea, constipation, pain near the anus) TENDERNESS IN MOUTH AND THROAT WITH OR WITHOUT PRESENCE OF ULCERS (sore throat, sores in mouth, or a toothache) UNUSUAL RASH, SWELLING OR PAIN  UNUSUAL VAGINAL DISCHARGE OR ITCHING   Items with * indicate a potential emergency and should be followed up as soon as possible or go to the Emergency Department if any problems should occur.  Please show the CHEMOTHERAPY ALERT CARD or IMMUNOTHERAPY ALERT  CARD at check-in to the Emergency Department and triage nurse.  Should you have questions after your visit or need to cancel or reschedule your appointment, please contact [facility] - A DEPT OF [facility]  [phone] and follow the prompts.  Office hours are 8:00 a.m. to 4:30 p.m. Monday - Friday. Please note that voicemails left after 4:00 p.m. may not be returned until the following business day.  We are closed weekends and major holidays. You have access to a nurse at all times for urgent questions. Please call the main number to the clinic [phone] and follow the prompts.  For any non-urgent questions, you may also contact your provider using MyChart. We now offer e-Visits for anyone 58 and older to request care online for non-urgent symptoms. For details visit [email or web address].   Also download the MyChart app! Go to the app store, search "MyChart", open the app, select [place], and log in with your MyChart username and password.  Gemcitabine Injection What is this medication? GEMCITABINE (jem SYE ta been) treats some types of cancer. It works by slowing down the growth of cancer cells. This medicine may be used for other purposes; ask your health care provider or pharmacist if you have questions. COMMON BRAND NAME(S): Gemzar, Infugem What should I tell my care team before I take this medication? They need to know if you have any of these conditions: Blood disorders Infection Kidney disease Liver disease Lung or breathing disease, such as asthma or COPD Recent or ongoing radiation therapy An unusual or allergic  reaction to gemcitabine, other medications, foods, dyes, or preservatives If you or your partner are pregnant or trying to get pregnant Breast-feeding How should I use this medication? This medication is injected into a vein. It is given by your care team in a hospital or clinic setting. Talk to your care team about the use of  this medication in children. Special care may be needed. Overdosage: If you think you have taken too much of this medicine contact a poison control center or emergency room at once. NOTE: This medicine is only for you. Do not share this medicine with others. What if I miss a dose? Keep appointments for follow-up doses. It is important not to miss your dose. Call your care team if you are unable to keep an appointment. What may interact with this medication? Interactions have not been studied. This list may not describe all possible interactions. Give your health care provider a list of all the medicines, herbs, non-prescription drugs, or dietary supplements you use. Also tell them if you smoke, drink alcohol, or use illegal drugs. Some items may interact with your medicine. What should I watch for while using this medication? Your condition will be monitored carefully while you are receiving this medication. This medication may make you feel generally unwell. This is not uncommon, as chemotherapy can affect healthy cells as well as cancer cells. Report any side effects. Continue your course of treatment even though you feel ill unless your care team tells you to stop. In some cases, you may be given additional medications to help with side effects. Follow all directions for their use. This medication may increase your risk of getting an infection. Call your care team for advice if you get a fever, chills, sore throat, or other symptoms of a cold or flu. Do not treat yourself. Try to avoid being around people who are sick. This medication may increase your risk to bruise or bleed. Call your care team if you notice any unusual bleeding. Be careful brushing or flossing your teeth or using a toothpick because you may get an infection or bleed more easily. If you have any dental work done, tell your dentist you are receiving this medication. Avoid taking medications that contain aspirin, acetaminophen,  ibuprofen, naproxen, or ketoprofen unless instructed by your care team. These medications may hide a fever. Talk to your care team if you or your partner wish to become pregnant or think you might be pregnant. This medication can cause serious birth defects if taken during pregnancy and for 6 months after the last dose. A negative pregnancy test is required before starting this medication. A reliable form of contraception is recommended while taking this medication and for 6 months after the last dose. Talk to your care team about effective forms of contraception. Do not father a child while taking this medication and for 3 months after the last dose. Use a condom while having sex during this time period. Do not breastfeed while taking this medication and for at least 1 week after the last dose. This medication may cause infertility. Talk to your care team if you are concerned about your fertility. What side effects may I notice from receiving this medication? Side effects that you should report to your care team as soon as possible: Allergic reactions--skin rash, itching, hives, swelling of the face, lips, tongue, or throat Capillary leak syndrome--stomach or muscle pain, unusual weakness or fatigue, feeling faint or lightheaded, decrease in the amount of urine, swelling of  the ankles, hands, or feet, trouble breathing Infection--fever, chills, cough, sore throat, wounds that don't heal, pain or trouble when passing urine, general feeling of discomfort or being unwell Liver injury--right upper belly pain, loss of appetite, nausea, light-colored stool, dark yellow or brown urine, yellowing skin or eyes, unusual weakness or fatigue Low red blood cell level--unusual weakness or fatigue, dizziness, headache, trouble breathing Lung injury--shortness of breath or trouble breathing, cough, spitting up blood, chest pain, fever Stomach pain, bloody diarrhea, pale skin, unusual weakness or fatigue, decrease in  the amount of urine, which may be signs of hemolytic uremic syndrome Sudden and severe headache, confusion, change in vision, seizures, which may be signs of posterior reversible encephalopathy syndrome (PRES) Unusual bruising or bleeding Side effects that usually do not require medical attention (report to your care team if they continue or are bothersome): Diarrhea Drowsiness Hair loss Nausea Pain, redness, or swelling with sores inside the mouth or throat Vomiting This list may not describe all possible side effects. Call your doctor for medical advice about side effects. You may report side effects to FDA at 1-800-FDA-1088. Where should I keep my medication? This medication is given in a hospital or clinic. It will not be stored at home. NOTE: This sheet is a summary. It may not cover all possible information. If you have questions about this medicine, talk to your doctor, pharmacist, or health care provider.  2024 Elsevier/Gold Standard (2021-11-24 00:00:00)

## 2023-08-05 ENCOUNTER — Other Ambulatory Visit: Payer: Self-pay

## 2023-08-05 ENCOUNTER — Ambulatory Visit
Admission: RE | Admit: 2023-08-05 | Discharge: 2023-08-05 | Disposition: A | Discharge status: Home / Self Care | Payer: Medicare Other | Source: Ambulatory Visit | Attending: Radiation Oncology | Admitting: Radiation Oncology

## 2023-08-05 DIAGNOSIS — Z5111 Encounter for antineoplastic chemotherapy: Secondary | ICD-10-CM | POA: Diagnosis not present

## 2023-08-05 DIAGNOSIS — R300 Dysuria: Secondary | ICD-10-CM | POA: Diagnosis not present

## 2023-08-05 DIAGNOSIS — Z510 Encounter for antineoplastic radiation therapy: Secondary | ICD-10-CM | POA: Diagnosis not present

## 2023-08-05 DIAGNOSIS — Z7901 Long term (current) use of anticoagulants: Secondary | ICD-10-CM | POA: Diagnosis not present

## 2023-08-05 DIAGNOSIS — Z86718 Personal history of other venous thrombosis and embolism: Secondary | ICD-10-CM | POA: Diagnosis not present

## 2023-08-05 DIAGNOSIS — C675 Malignant neoplasm of bladder neck: Secondary | ICD-10-CM | POA: Diagnosis not present

## 2023-08-05 DIAGNOSIS — C680 Malignant neoplasm of urethra: Secondary | ICD-10-CM | POA: Diagnosis not present

## 2023-08-05 LAB — RAD ONC ARIA SESSION SUMMARY
Course Elapsed Days: 4
Plan Fractions Treated to Date: 4
Plan Prescribed Dose Per Fraction: 1.8 Gy
Plan Total Fractions Prescribed: 25
Plan Total Prescribed Dose: 45 Gy
Reference Point Dosage Given to Date: 7.2 Gy
Reference Point Session Dosage Given: 1.8 Gy
Session Number: 4

## 2023-08-08 ENCOUNTER — Ambulatory Visit
Admission: RE | Admit: 2023-08-08 | Discharge: 2023-08-08 | Disposition: A | Discharge status: Home / Self Care | Payer: Medicare Other | Source: Ambulatory Visit | Attending: Radiation Oncology | Admitting: Radiation Oncology

## 2023-08-08 ENCOUNTER — Encounter: Payer: Self-pay | Admitting: Oncology

## 2023-08-08 ENCOUNTER — Inpatient Hospital Stay: Payer: Medicare Other

## 2023-08-08 ENCOUNTER — Other Ambulatory Visit: Payer: Self-pay

## 2023-08-08 ENCOUNTER — Inpatient Hospital Stay (HOSPITAL_BASED_OUTPATIENT_CLINIC_OR_DEPARTMENT_OTHER): Payer: Medicare Other | Admitting: Oncology

## 2023-08-08 VITALS — BP 118/74 | HR 74 | Temp 97.2°F | Resp 18 | Wt 183.1 lb

## 2023-08-08 VITALS — BP 133/75 | HR 86 | Resp 18

## 2023-08-08 DIAGNOSIS — Z510 Encounter for antineoplastic radiation therapy: Secondary | ICD-10-CM | POA: Diagnosis not present

## 2023-08-08 DIAGNOSIS — Z7901 Long term (current) use of anticoagulants: Secondary | ICD-10-CM | POA: Diagnosis not present

## 2023-08-08 DIAGNOSIS — R300 Dysuria: Secondary | ICD-10-CM | POA: Diagnosis not present

## 2023-08-08 DIAGNOSIS — C675 Malignant neoplasm of bladder neck: Secondary | ICD-10-CM | POA: Diagnosis not present

## 2023-08-08 DIAGNOSIS — Z5111 Encounter for antineoplastic chemotherapy: Secondary | ICD-10-CM | POA: Diagnosis not present

## 2023-08-08 DIAGNOSIS — Z86718 Personal history of other venous thrombosis and embolism: Secondary | ICD-10-CM | POA: Diagnosis not present

## 2023-08-08 DIAGNOSIS — C680 Malignant neoplasm of urethra: Secondary | ICD-10-CM | POA: Diagnosis not present

## 2023-08-08 DIAGNOSIS — C689 Malignant neoplasm of urinary organ, unspecified: Secondary | ICD-10-CM

## 2023-08-08 LAB — CBC WITH DIFFERENTIAL (CANCER CENTER ONLY)
Abs Immature Granulocytes: 0.02 10*3/uL (ref 0.00–0.07)
Basophils Absolute: 0 10*3/uL (ref 0.0–0.1)
Basophils Relative: 0 %
Eosinophils Absolute: 0.2 10*3/uL (ref 0.0–0.5)
Eosinophils Relative: 5 %
HCT: 37 % — ABNORMAL LOW (ref 39.0–52.0)
Hemoglobin: 12.1 g/dL — ABNORMAL LOW (ref 13.0–17.0)
Immature Granulocytes: 0 %
Lymphocytes Relative: 20 %
Lymphs Abs: 0.9 10*3/uL (ref 0.7–4.0)
MCH: 29.9 pg (ref 26.0–34.0)
MCHC: 32.7 g/dL (ref 30.0–36.0)
MCV: 91.4 fL (ref 80.0–100.0)
Monocytes Absolute: 0.3 10*3/uL (ref 0.1–1.0)
Monocytes Relative: 6 %
Neutro Abs: 3.1 10*3/uL (ref 1.7–7.7)
Neutrophils Relative %: 69 %
Platelet Count: 163 10*3/uL (ref 150–400)
RBC: 4.05 MIL/uL — ABNORMAL LOW (ref 4.22–5.81)
RDW: 13.3 % (ref 11.5–15.5)
WBC Count: 4.6 10*3/uL (ref 4.0–10.5)
nRBC: 0 % (ref 0.0–0.2)

## 2023-08-08 LAB — CMP (CANCER CENTER ONLY)
ALT: 22 U/L (ref 0–44)
AST: 31 U/L (ref 15–41)
Albumin: 3.5 g/dL (ref 3.5–5.0)
Alkaline Phosphatase: 88 U/L (ref 38–126)
Anion gap: 7 (ref 5–15)
BUN: 19 mg/dL (ref 8–23)
CO2: 26 mmol/L (ref 22–32)
Calcium: 9.4 mg/dL (ref 8.9–10.3)
Chloride: 105 mmol/L (ref 98–111)
Creatinine: 0.87 mg/dL (ref 0.61–1.24)
GFR, Estimated: >60 mL/min (ref >60)
Glucose, Bld: 106 mg/dL — ABNORMAL HIGH (ref 70–99)
Potassium: 4.3 mmol/L (ref 3.5–5.1)
Sodium: 138 mmol/L (ref 135–145)
Total Bilirubin: 0.5 mg/dL (ref 0.0–1.2)
Total Protein: 7 g/dL (ref 6.5–8.1)

## 2023-08-08 LAB — RAD ONC ARIA SESSION SUMMARY
Course Elapsed Days: 7
Plan Fractions Treated to Date: 5
Plan Prescribed Dose Per Fraction: 1.8 Gy
Plan Total Fractions Prescribed: 25
Plan Total Prescribed Dose: 45 Gy
Reference Point Dosage Given to Date: 9 Gy
Reference Point Session Dosage Given: 1.8 Gy
Session Number: 5

## 2023-08-08 MED ORDER — SODIUM CHLORIDE 0.9 % IV SOLN
Freq: Once | INTRAVENOUS | Status: AC
  Filled 2023-08-08: qty 250

## 2023-08-08 MED ORDER — PROCHLORPERAZINE MALEATE 10 MG PO TABS
10.0000 mg | ORAL_TABLET | Freq: Once | ORAL | Status: AC
  Administered 2023-08-08: 10 mg via ORAL
  Filled 2023-08-08: qty 1

## 2023-08-08 MED ORDER — SODIUM CHLORIDE 0.9 % IV SOLN
27.0000 mg/m2 | Freq: Once | INTRAVENOUS | Status: AC
  Administered 2023-08-08: 53.2 mg via INTRAVENOUS
  Filled 2023-08-08: qty 1.4
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
## 2023-08-08 NOTE — Assessment & Plan Note (Addendum)
 Urothelial carcinoma of prostatic urethra/bladder neck, cT2, muscle invasive.  He is not a candidate for radical cystectomy.  Recommend concurrent chemotherapy with radiation. Obtain CT chest to complete staging.SABRA  He has severe hearing loss of right ear, and moderate hearing loss of left ear. Cisplatin is not an ideal chemotherapy choice.  Recommend bi weekly Gemcitabine  27mg /m2 concurrently with radiation.  Labs are reviewed and discussed with patient. Proceed with  Gemcitabine  27mg /m2, follow up with Radonc for radiation.  He will get D11 Gemcitabine  later this week.

## 2023-08-08 NOTE — Assessment & Plan Note (Signed)
 Chemotherapy treatment as planned above

## 2023-08-08 NOTE — Patient Instructions (Signed)
 [facility] - A DEPT OF [facility]  Discharge Instructions: Thank you for choosing [facility] to provide your oncology and hematology care.  If you have a lab appointment with the Cancer Center, please go directly to the Cancer Center and check in at the registration area.  Wear comfortable clothing and clothing appropriate for easy access to any Portacath or PICC line.   We strive to give you quality time with your provider. You may need to reschedule your appointment if you arrive late (15 or more minutes).  Arriving late affects you and other patients whose appointments are after yours.  Also, if you miss three or more appointments without notifying the office, you may be dismissed from the clinic at the provider's discretion.      For prescription refill requests, have your pharmacy contact our office and allow 72 hours for refills to be completed.    Today you received the following chemotherapy and/or immunotherapy agents gemcitabine    To help prevent nausea and vomiting after your treatment, we encourage you to take your nausea medication as directed.  BELOW ARE SYMPTOMS THAT SHOULD BE REPORTED IMMEDIATELY: *FEVER GREATER THAN 100.4 F (38 C) OR HIGHER *CHILLS OR SWEATING *NAUSEA AND VOMITING THAT IS NOT CONTROLLED WITH YOUR NAUSEA MEDICATION *UNUSUAL SHORTNESS OF BREATH *UNUSUAL BRUISING OR BLEEDING *URINARY PROBLEMS (pain or burning when urinating, or frequent urination) *BOWEL PROBLEMS (unusual diarrhea, constipation, pain near the anus) TENDERNESS IN MOUTH AND THROAT WITH OR WITHOUT PRESENCE OF ULCERS (sore throat, sores in mouth, or a toothache) UNUSUAL RASH, SWELLING OR PAIN  UNUSUAL VAGINAL DISCHARGE OR ITCHING   Items with * indicate a potential emergency and should be followed up as soon as possible or go to the Emergency Department if any problems should occur.  Please show the CHEMOTHERAPY ALERT CARD or IMMUNOTHERAPY  ALERT CARD at check-in to the Emergency Department and triage nurse.  Should you have questions after your visit or need to cancel or reschedule your appointment, please contact [facility] - A DEPT OF [facility]  [phone] and follow the prompts.  Office hours are 8:00 a.m. to 4:30 p.m. Monday - Friday. Please note that voicemails left after 4:00 p.m. may not be returned until the following business day.  We are closed weekends and major holidays. You have access to a nurse at all times for urgent questions. Please call the main number to the clinic [phone] and follow the prompts.  For any non-urgent questions, you may also contact your provider using MyChart. We now offer e-Visits for anyone 52 and older to request care online for non-urgent symptoms. For details visit [email or web address].   Also download the MyChart app! Go to the app store, search "MyChart", open the app, select [facility], and log in with your MyChart username and password.  Gemcitabine Injection What is this medication? GEMCITABINE (jem SYE ta been) treats some types of cancer. It works by slowing down the growth of cancer cells. This medicine may be used for other purposes; ask your health care provider or pharmacist if you have questions. COMMON BRAND NAME(S): Gemzar, Infugem What should I tell my care team before I take this medication? They need to know if you have any of these conditions: Blood disorders Infection Kidney disease Liver disease Lung or breathing disease, such as asthma or COPD Recent or ongoing radiation therapy An unusual or allergic reaction to  gemcitabine, other medications, foods, dyes, or preservatives If you or your partner are pregnant or trying to get pregnant Breast-feeding How should I use this medication? This medication is injected into a vein. It is given by your care team in a hospital or clinic setting. Talk to your care team about the  use of this medication in children. Special care may be needed. Overdosage: If you think you have taken too much of this medicine contact a poison control center or emergency room at once. NOTE: This medicine is only for you. Do not share this medicine with others. What if I miss a dose? Keep appointments for follow-up doses. It is important not to miss your dose. Call your care team if you are unable to keep an appointment. What may interact with this medication? Interactions have not been studied. This list may not describe all possible interactions. Give your health care provider a list of all the medicines, herbs, non-prescription drugs, or dietary supplements you use. Also tell them if you smoke, drink alcohol, or use illegal drugs. Some items may interact with your medicine. What should I watch for while using this medication? Your condition will be monitored carefully while you are receiving this medication. This medication may make you feel generally unwell. This is not uncommon, as chemotherapy can affect healthy cells as well as cancer cells. Report any side effects. Continue your course of treatment even though you feel ill unless your care team tells you to stop. In some cases, you may be given additional medications to help with side effects. Follow all directions for their use. This medication may increase your risk of getting an infection. Call your care team for advice if you get a fever, chills, sore throat, or other symptoms of a cold or flu. Do not treat yourself. Try to avoid being around people who are sick. This medication may increase your risk to bruise or bleed. Call your care team if you notice any unusual bleeding. Be careful brushing or flossing your teeth or using a toothpick because you may get an infection or bleed more easily. If you have any dental work done, tell your dentist you are receiving this medication. Avoid taking medications that contain aspirin,  acetaminophen, ibuprofen, naproxen, or ketoprofen unless instructed by your care team. These medications may hide a fever. Talk to your care team if you or your partner wish to become pregnant or think you might be pregnant. This medication can cause serious birth defects if taken during pregnancy and for 6 months after the last dose. A negative pregnancy test is required before starting this medication. A reliable form of contraception is recommended while taking this medication and for 6 months after the last dose. Talk to your care team about effective forms of contraception. Do not father a child while taking this medication and for 3 months after the last dose. Use a condom while having sex during this time period. Do not breastfeed while taking this medication and for at least 1 week after the last dose. This medication may cause infertility. Talk to your care team if you are concerned about your fertility. What side effects may I notice from receiving this medication? Side effects that you should report to your care team as soon as possible: Allergic reactions--skin rash, itching, hives, swelling of the face, lips, tongue, or throat Capillary leak syndrome--stomach or muscle pain, unusual weakness or fatigue, feeling faint or lightheaded, decrease in the amount of urine, swelling of the ankles,  hands, or feet, trouble breathing Infection--fever, chills, cough, sore throat, wounds that don't heal, pain or trouble when passing urine, general feeling of discomfort or being unwell Liver injury--right upper belly pain, loss of appetite, nausea, light-colored stool, dark yellow or brown urine, yellowing skin or eyes, unusual weakness or fatigue Low red blood cell level--unusual weakness or fatigue, dizziness, headache, trouble breathing Lung injury--shortness of breath or trouble breathing, cough, spitting up blood, chest pain, fever Stomach pain, bloody diarrhea, pale skin, unusual weakness or fatigue,  decrease in the amount of urine, which may be signs of hemolytic uremic syndrome Sudden and severe headache, confusion, change in vision, seizures, which may be signs of posterior reversible encephalopathy syndrome (PRES) Unusual bruising or bleeding Side effects that usually do not require medical attention (report to your care team if they continue or are bothersome): Diarrhea Drowsiness Hair loss Nausea Pain, redness, or swelling with sores inside the mouth or throat Vomiting This list may not describe all possible side effects. Call your doctor for medical advice about side effects. You may report side effects to FDA at 1-800-FDA-1088. Where should I keep my medication? This medication is given in a hospital or clinic. It will not be stored at home. NOTE: This sheet is a summary. It may not cover all possible information. If you have questions about this medicine, talk to your doctor, pharmacist, or health care provider.  2024 [employer]/Gold Standard (2021-11-24 00:00:00)

## 2023-08-08 NOTE — Assessment & Plan Note (Signed)
 Baseline dysuria and mild hematuria.  Anticipate symptoms may exacerbate during radiation.  Recommend patient to follow up with urology.

## 2023-08-09 ENCOUNTER — Other Ambulatory Visit: Payer: Self-pay

## 2023-08-09 ENCOUNTER — Ambulatory Visit
Admission: RE | Admit: 2023-08-09 | Discharge: 2023-08-09 | Disposition: A | Discharge status: Home / Self Care | Payer: Medicare Other | Source: Ambulatory Visit | Attending: Radiation Oncology | Admitting: Radiation Oncology

## 2023-08-09 DIAGNOSIS — Z86718 Personal history of other venous thrombosis and embolism: Secondary | ICD-10-CM | POA: Diagnosis not present

## 2023-08-09 DIAGNOSIS — Z7901 Long term (current) use of anticoagulants: Secondary | ICD-10-CM | POA: Diagnosis not present

## 2023-08-09 DIAGNOSIS — C675 Malignant neoplasm of bladder neck: Secondary | ICD-10-CM | POA: Diagnosis not present

## 2023-08-09 DIAGNOSIS — R300 Dysuria: Secondary | ICD-10-CM | POA: Diagnosis not present

## 2023-08-09 DIAGNOSIS — C680 Malignant neoplasm of urethra: Secondary | ICD-10-CM | POA: Diagnosis not present

## 2023-08-09 DIAGNOSIS — Z5111 Encounter for antineoplastic chemotherapy: Secondary | ICD-10-CM | POA: Diagnosis not present

## 2023-08-09 DIAGNOSIS — Z510 Encounter for antineoplastic radiation therapy: Secondary | ICD-10-CM | POA: Diagnosis not present

## 2023-08-09 LAB — RAD ONC ARIA SESSION SUMMARY
Course Elapsed Days: 8
Plan Fractions Treated to Date: 6
Plan Prescribed Dose Per Fraction: 1.8 Gy
Plan Total Fractions Prescribed: 25
Plan Total Prescribed Dose: 45 Gy
Reference Point Dosage Given to Date: 10.8 Gy
Reference Point Session Dosage Given: 1.8 Gy
Session Number: 6

## 2023-08-10 ENCOUNTER — Ambulatory Visit
Admission: RE | Admit: 2023-08-10 | Discharge: 2023-08-10 | Disposition: A | Discharge status: Home / Self Care | Payer: Medicare Other | Source: Ambulatory Visit | Attending: Radiation Oncology | Admitting: Radiation Oncology

## 2023-08-10 ENCOUNTER — Other Ambulatory Visit: Payer: Self-pay

## 2023-08-10 DIAGNOSIS — Z510 Encounter for antineoplastic radiation therapy: Secondary | ICD-10-CM | POA: Diagnosis not present

## 2023-08-10 DIAGNOSIS — Z86718 Personal history of other venous thrombosis and embolism: Secondary | ICD-10-CM | POA: Diagnosis not present

## 2023-08-10 DIAGNOSIS — C675 Malignant neoplasm of bladder neck: Secondary | ICD-10-CM | POA: Diagnosis not present

## 2023-08-10 DIAGNOSIS — C680 Malignant neoplasm of urethra: Secondary | ICD-10-CM | POA: Diagnosis not present

## 2023-08-10 DIAGNOSIS — Z7901 Long term (current) use of anticoagulants: Secondary | ICD-10-CM | POA: Diagnosis not present

## 2023-08-10 DIAGNOSIS — Z5111 Encounter for antineoplastic chemotherapy: Secondary | ICD-10-CM | POA: Diagnosis not present

## 2023-08-10 DIAGNOSIS — R300 Dysuria: Secondary | ICD-10-CM | POA: Diagnosis not present

## 2023-08-10 LAB — RAD ONC ARIA SESSION SUMMARY
Course Elapsed Days: 9
Plan Fractions Treated to Date: 7
Plan Prescribed Dose Per Fraction: 1.8 Gy
Plan Total Fractions Prescribed: 25
Plan Total Prescribed Dose: 45 Gy
Reference Point Dosage Given to Date: 12.6 Gy
Reference Point Session Dosage Given: 1.8 Gy
Session Number: 7

## 2023-08-11 ENCOUNTER — Ambulatory Visit: Payer: Medicare Other

## 2023-08-11 ENCOUNTER — Other Ambulatory Visit: Payer: Self-pay

## 2023-08-11 ENCOUNTER — Ambulatory Visit
Admission: RE | Admit: 2023-08-11 | Discharge: 2023-08-11 | Disposition: A | Discharge status: Home / Self Care | Payer: Medicare Other | Source: Ambulatory Visit | Attending: Radiation Oncology | Admitting: Radiation Oncology

## 2023-08-11 ENCOUNTER — Inpatient Hospital Stay: Payer: Medicare Other

## 2023-08-11 VITALS — BP 133/88 | HR 83 | Temp 98.2°F | Resp 16

## 2023-08-11 DIAGNOSIS — C675 Malignant neoplasm of bladder neck: Secondary | ICD-10-CM | POA: Diagnosis not present

## 2023-08-11 DIAGNOSIS — Z510 Encounter for antineoplastic radiation therapy: Secondary | ICD-10-CM | POA: Diagnosis not present

## 2023-08-11 DIAGNOSIS — Z86718 Personal history of other venous thrombosis and embolism: Secondary | ICD-10-CM | POA: Diagnosis not present

## 2023-08-11 DIAGNOSIS — C680 Malignant neoplasm of urethra: Secondary | ICD-10-CM | POA: Diagnosis not present

## 2023-08-11 DIAGNOSIS — C689 Malignant neoplasm of urinary organ, unspecified: Secondary | ICD-10-CM

## 2023-08-11 DIAGNOSIS — R300 Dysuria: Secondary | ICD-10-CM | POA: Diagnosis not present

## 2023-08-11 DIAGNOSIS — Z5111 Encounter for antineoplastic chemotherapy: Secondary | ICD-10-CM | POA: Diagnosis not present

## 2023-08-11 DIAGNOSIS — Z7901 Long term (current) use of anticoagulants: Secondary | ICD-10-CM | POA: Diagnosis not present

## 2023-08-11 LAB — RAD ONC ARIA SESSION SUMMARY
Course Elapsed Days: 10
Plan Fractions Treated to Date: 8
Plan Prescribed Dose Per Fraction: 1.8 Gy
Plan Total Fractions Prescribed: 25
Plan Total Prescribed Dose: 45 Gy
Reference Point Dosage Given to Date: 14.4 Gy
Reference Point Session Dosage Given: 1.8 Gy
Session Number: 8

## 2023-08-11 LAB — CBC WITH DIFFERENTIAL (CANCER CENTER ONLY)
Abs Immature Granulocytes: 0.02 10*3/uL (ref 0.00–0.07)
Basophils Absolute: 0 10*3/uL (ref 0.0–0.1)
Basophils Relative: 1 %
Eosinophils Absolute: 0.1 10*3/uL (ref 0.0–0.5)
Eosinophils Relative: 3 %
HCT: 37.1 % — ABNORMAL LOW (ref 39.0–52.0)
Hemoglobin: 12.1 g/dL — ABNORMAL LOW (ref 13.0–17.0)
Immature Granulocytes: 1 %
Lymphocytes Relative: 17 %
Lymphs Abs: 0.7 10*3/uL (ref 0.7–4.0)
MCH: 30 pg (ref 26.0–34.0)
MCHC: 32.6 g/dL (ref 30.0–36.0)
MCV: 91.8 fL (ref 80.0–100.0)
Monocytes Absolute: 0.2 10*3/uL (ref 0.1–1.0)
Monocytes Relative: 5 %
Neutro Abs: 3.2 10*3/uL (ref 1.7–7.7)
Neutrophils Relative %: 73 %
Platelet Count: 162 10*3/uL (ref 150–400)
RBC: 4.04 MIL/uL — ABNORMAL LOW (ref 4.22–5.81)
RDW: 13.3 % (ref 11.5–15.5)
WBC Count: 4.3 10*3/uL (ref 4.0–10.5)
nRBC: 0 % (ref 0.0–0.2)

## 2023-08-11 LAB — CMP (CANCER CENTER ONLY)
ALT: 21 U/L (ref 0–44)
AST: 29 U/L (ref 15–41)
Albumin: 3.7 g/dL (ref 3.5–5.0)
Alkaline Phosphatase: 91 U/L (ref 38–126)
Anion gap: 10 (ref 5–15)
BUN: 24 mg/dL — ABNORMAL HIGH (ref 8–23)
CO2: 24 mmol/L (ref 22–32)
Calcium: 9.2 mg/dL (ref 8.9–10.3)
Chloride: 107 mmol/L (ref 98–111)
Creatinine: 0.8 mg/dL (ref 0.61–1.24)
GFR, Estimated: >60 mL/min (ref >60)
Glucose, Bld: 115 mg/dL — ABNORMAL HIGH (ref 70–99)
Potassium: 4.1 mmol/L (ref 3.5–5.1)
Sodium: 141 mmol/L (ref 135–145)
Total Bilirubin: 0.9 mg/dL (ref 0.0–1.2)
Total Protein: 7.4 g/dL (ref 6.5–8.1)

## 2023-08-11 MED ORDER — SODIUM CHLORIDE 0.9 % IV SOLN
27.0000 mg/m2 | Freq: Once | INTRAVENOUS | Status: AC
  Administered 2023-08-11: 53.2 mg via INTRAVENOUS
  Filled 2023-08-11: qty 1.4

## 2023-08-11 MED ORDER — SODIUM CHLORIDE 0.9 % IV SOLN
Freq: Once | INTRAVENOUS | Status: AC
  Filled 2023-08-11: qty 250

## 2023-08-11 MED ORDER — PROCHLORPERAZINE MALEATE 10 MG PO TABS
10.0000 mg | ORAL_TABLET | Freq: Once | ORAL | Status: AC
  Administered 2023-08-11: 10 mg via ORAL
  Filled 2023-08-11: qty 1

## 2023-08-11 NOTE — Patient Instructions (Signed)
 [facility] - A DEPT OF [facility]  Discharge Instructions: Thank you for choosing [facility] to provide your oncology and hematology care.  If you have a lab appointment with the Cancer Center, please go directly to the Cancer Center and check in at the registration area.  Wear comfortable clothing and clothing appropriate for easy access to any Portacath or PICC line.   We strive to give you quality time with your provider. You may need to reschedule your appointment if you arrive late (15 or more minutes).  Arriving late affects you and other patients whose appointments are after yours.  Also, if you miss three or more appointments without notifying the office, you may be dismissed from the clinic at the provider's discretion.      For prescription refill requests, have your pharmacy contact our office and allow 72 hours for refills to be completed.    Today you received the following chemotherapy and/or immunotherapy agents gemcitabine    To help prevent nausea and vomiting after your treatment, we encourage you to take your nausea medication as directed.  BELOW ARE SYMPTOMS THAT SHOULD BE REPORTED IMMEDIATELY: *FEVER GREATER THAN 100.4 F (38 C) OR HIGHER *CHILLS OR SWEATING *NAUSEA AND VOMITING THAT IS NOT CONTROLLED WITH YOUR NAUSEA MEDICATION *UNUSUAL SHORTNESS OF BREATH *UNUSUAL BRUISING OR BLEEDING *URINARY PROBLEMS (pain or burning when urinating, or frequent urination) *BOWEL PROBLEMS (unusual diarrhea, constipation, pain near the anus) TENDERNESS IN MOUTH AND THROAT WITH OR WITHOUT PRESENCE OF ULCERS (sore throat, sores in mouth, or a toothache) UNUSUAL RASH, SWELLING OR PAIN  UNUSUAL VAGINAL DISCHARGE OR ITCHING   Items with * indicate a potential emergency and should be followed up as soon as possible or go to the Emergency Department if any problems should occur.  Please show the CHEMOTHERAPY ALERT CARD or IMMUNOTHERAPY  ALERT CARD at check-in to the Emergency Department and triage nurse.  Should you have questions after your visit or need to cancel or reschedule your appointment, please contact [facility] - A DEPT OF [facility]  [phone] and follow the prompts.  Office hours are 8:00 a.m. to 4:30 p.m. Monday - Friday. Please note that voicemails left after 4:00 p.m. may not be returned until the following business day.  We are closed weekends and major holidays. You have access to a nurse at all times for urgent questions. Please call the main number to the clinic [phone] and follow the prompts.  For any non-urgent questions, you may also contact your provider using MyChart. We now offer e-Visits for anyone 52 and older to request care online for non-urgent symptoms. For details visit [email or web address].   Also download the MyChart app! Go to the app store, search "MyChart", open the app, select [facility], and log in with your MyChart username and password.  Gemcitabine Injection What is this medication? GEMCITABINE (jem SYE ta been) treats some types of cancer. It works by slowing down the growth of cancer cells. This medicine may be used for other purposes; ask your health care provider or pharmacist if you have questions. COMMON BRAND NAME(S): Gemzar, Infugem What should I tell my care team before I take this medication? They need to know if you have any of these conditions: Blood disorders Infection Kidney disease Liver disease Lung or breathing disease, such as asthma or COPD Recent or ongoing radiation therapy An unusual or allergic reaction to  gemcitabine, other medications, foods, dyes, or preservatives If you or your partner are pregnant or trying to get pregnant Breast-feeding How should I use this medication? This medication is injected into a vein. It is given by your care team in a hospital or clinic setting. Talk to your care team about the  use of this medication in children. Special care may be needed. Overdosage: If you think you have taken too much of this medicine contact a poison control center or emergency room at once. NOTE: This medicine is only for you. Do not share this medicine with others. What if I miss a dose? Keep appointments for follow-up doses. It is important not to miss your dose. Call your care team if you are unable to keep an appointment. What may interact with this medication? Interactions have not been studied. This list may not describe all possible interactions. Give your health care provider a list of all the medicines, herbs, non-prescription drugs, or dietary supplements you use. Also tell them if you smoke, drink alcohol, or use illegal drugs. Some items may interact with your medicine. What should I watch for while using this medication? Your condition will be monitored carefully while you are receiving this medication. This medication may make you feel generally unwell. This is not uncommon, as chemotherapy can affect healthy cells as well as cancer cells. Report any side effects. Continue your course of treatment even though you feel ill unless your care team tells you to stop. In some cases, you may be given additional medications to help with side effects. Follow all directions for their use. This medication may increase your risk of getting an infection. Call your care team for advice if you get a fever, chills, sore throat, or other symptoms of a cold or flu. Do not treat yourself. Try to avoid being around people who are sick. This medication may increase your risk to bruise or bleed. Call your care team if you notice any unusual bleeding. Be careful brushing or flossing your teeth or using a toothpick because you may get an infection or bleed more easily. If you have any dental work done, tell your dentist you are receiving this medication. Avoid taking medications that contain aspirin,  acetaminophen, ibuprofen, naproxen, or ketoprofen unless instructed by your care team. These medications may hide a fever. Talk to your care team if you or your partner wish to become pregnant or think you might be pregnant. This medication can cause serious birth defects if taken during pregnancy and for 6 months after the last dose. A negative pregnancy test is required before starting this medication. A reliable form of contraception is recommended while taking this medication and for 6 months after the last dose. Talk to your care team about effective forms of contraception. Do not father a child while taking this medication and for 3 months after the last dose. Use a condom while having sex during this time period. Do not breastfeed while taking this medication and for at least 1 week after the last dose. This medication may cause infertility. Talk to your care team if you are concerned about your fertility. What side effects may I notice from receiving this medication? Side effects that you should report to your care team as soon as possible: Allergic reactions--skin rash, itching, hives, swelling of the face, lips, tongue, or throat Capillary leak syndrome--stomach or muscle pain, unusual weakness or fatigue, feeling faint or lightheaded, decrease in the amount of urine, swelling of the ankles,  hands, or feet, trouble breathing Infection--fever, chills, cough, sore throat, wounds that don't heal, pain or trouble when passing urine, general feeling of discomfort or being unwell Liver injury--right upper belly pain, loss of appetite, nausea, light-colored stool, dark yellow or brown urine, yellowing skin or eyes, unusual weakness or fatigue Low red blood cell level--unusual weakness or fatigue, dizziness, headache, trouble breathing Lung injury--shortness of breath or trouble breathing, cough, spitting up blood, chest pain, fever Stomach pain, bloody diarrhea, pale skin, unusual weakness or fatigue,  decrease in the amount of urine, which may be signs of hemolytic uremic syndrome Sudden and severe headache, confusion, change in vision, seizures, which may be signs of posterior reversible encephalopathy syndrome (PRES) Unusual bruising or bleeding Side effects that usually do not require medical attention (report to your care team if they continue or are bothersome): Diarrhea Drowsiness Hair loss Nausea Pain, redness, or swelling with sores inside the mouth or throat Vomiting This list may not describe all possible side effects. Call your doctor for medical advice about side effects. You may report side effects to FDA at 1-800-FDA-1088. Where should I keep my medication? This medication is given in a hospital or clinic. It will not be stored at home. NOTE: This sheet is a summary. It may not cover all possible information. If you have questions about this medicine, talk to your doctor, pharmacist, or health care provider.  2024 [employer]/Gold Standard (2021-11-24 00:00:00)

## 2023-08-12 ENCOUNTER — Other Ambulatory Visit: Payer: Self-pay

## 2023-08-12 ENCOUNTER — Ambulatory Visit
Admission: RE | Admit: 2023-08-12 | Discharge: 2023-08-12 | Disposition: A | Discharge status: Home / Self Care | Payer: Medicare Other | Source: Ambulatory Visit | Attending: Radiation Oncology | Admitting: Radiation Oncology

## 2023-08-12 DIAGNOSIS — C680 Malignant neoplasm of urethra: Secondary | ICD-10-CM | POA: Diagnosis not present

## 2023-08-12 DIAGNOSIS — C675 Malignant neoplasm of bladder neck: Secondary | ICD-10-CM | POA: Diagnosis not present

## 2023-08-12 DIAGNOSIS — C689 Malignant neoplasm of urinary organ, unspecified: Secondary | ICD-10-CM

## 2023-08-12 DIAGNOSIS — Z86718 Personal history of other venous thrombosis and embolism: Secondary | ICD-10-CM | POA: Diagnosis not present

## 2023-08-12 DIAGNOSIS — R300 Dysuria: Secondary | ICD-10-CM | POA: Diagnosis not present

## 2023-08-12 DIAGNOSIS — Z7901 Long term (current) use of anticoagulants: Secondary | ICD-10-CM | POA: Diagnosis not present

## 2023-08-12 DIAGNOSIS — Z5111 Encounter for antineoplastic chemotherapy: Secondary | ICD-10-CM | POA: Diagnosis not present

## 2023-08-12 DIAGNOSIS — Z510 Encounter for antineoplastic radiation therapy: Secondary | ICD-10-CM | POA: Diagnosis not present

## 2023-08-12 LAB — RAD ONC ARIA SESSION SUMMARY
Course Elapsed Days: 11
Plan Fractions Treated to Date: 9
Plan Prescribed Dose Per Fraction: 1.8 Gy
Plan Total Fractions Prescribed: 25
Plan Total Prescribed Dose: 45 Gy
Reference Point Dosage Given to Date: 16.2 Gy
Reference Point Session Dosage Given: 1.8 Gy
Session Number: 9

## 2023-08-15 ENCOUNTER — Ambulatory Visit
Admission: RE | Admit: 2023-08-15 | Discharge: 2023-08-15 | Disposition: A | Discharge status: Home / Self Care | Payer: Medicare Other | Source: Ambulatory Visit | Attending: Radiation Oncology | Admitting: Radiation Oncology

## 2023-08-15 ENCOUNTER — Inpatient Hospital Stay: Payer: Medicare Other

## 2023-08-15 ENCOUNTER — Other Ambulatory Visit: Payer: Self-pay

## 2023-08-15 ENCOUNTER — Encounter: Payer: Self-pay | Admitting: Oncology

## 2023-08-15 ENCOUNTER — Inpatient Hospital Stay (HOSPITAL_BASED_OUTPATIENT_CLINIC_OR_DEPARTMENT_OTHER): Payer: Medicare Other | Admitting: Oncology

## 2023-08-15 VITALS — BP 92/62 | HR 95 | Temp 97.3°F | Resp 18 | Wt 180.9 lb

## 2023-08-15 VITALS — BP 110/69 | HR 67 | Temp 99.0°F | Resp 19

## 2023-08-15 DIAGNOSIS — C680 Malignant neoplasm of urethra: Secondary | ICD-10-CM | POA: Diagnosis not present

## 2023-08-15 DIAGNOSIS — R300 Dysuria: Secondary | ICD-10-CM | POA: Diagnosis not present

## 2023-08-15 DIAGNOSIS — C689 Malignant neoplasm of urinary organ, unspecified: Secondary | ICD-10-CM | POA: Diagnosis not present

## 2023-08-15 DIAGNOSIS — Z510 Encounter for antineoplastic radiation therapy: Secondary | ICD-10-CM | POA: Diagnosis not present

## 2023-08-15 DIAGNOSIS — Z86718 Personal history of other venous thrombosis and embolism: Secondary | ICD-10-CM | POA: Diagnosis not present

## 2023-08-15 DIAGNOSIS — Z5111 Encounter for antineoplastic chemotherapy: Secondary | ICD-10-CM | POA: Diagnosis not present

## 2023-08-15 DIAGNOSIS — C675 Malignant neoplasm of bladder neck: Secondary | ICD-10-CM | POA: Diagnosis not present

## 2023-08-15 DIAGNOSIS — Z7901 Long term (current) use of anticoagulants: Secondary | ICD-10-CM | POA: Diagnosis not present

## 2023-08-15 DIAGNOSIS — E441 Mild protein-calorie malnutrition: Secondary | ICD-10-CM | POA: Insufficient documentation

## 2023-08-15 DIAGNOSIS — R634 Abnormal weight loss: Secondary | ICD-10-CM | POA: Diagnosis not present

## 2023-08-15 LAB — RAD ONC ARIA SESSION SUMMARY
Course Elapsed Days: 14
Plan Fractions Treated to Date: 10
Plan Prescribed Dose Per Fraction: 1.8 Gy
Plan Total Fractions Prescribed: 25
Plan Total Prescribed Dose: 45 Gy
Reference Point Dosage Given to Date: 18 Gy
Reference Point Session Dosage Given: 1.8 Gy
Session Number: 10

## 2023-08-15 LAB — CBC WITH DIFFERENTIAL (CANCER CENTER ONLY)
Abs Immature Granulocytes: 0.07 10*3/uL (ref 0.00–0.07)
Basophils Absolute: 0 10*3/uL (ref 0.0–0.1)
Basophils Relative: 1 %
Eosinophils Absolute: 0.1 10*3/uL (ref 0.0–0.5)
Eosinophils Relative: 3 %
HCT: 38.7 % — ABNORMAL LOW (ref 39.0–52.0)
Hemoglobin: 12.4 g/dL — ABNORMAL LOW (ref 13.0–17.0)
Immature Granulocytes: 2 %
Lymphocytes Relative: 16 %
Lymphs Abs: 0.7 10*3/uL (ref 0.7–4.0)
MCH: 29.8 pg (ref 26.0–34.0)
MCHC: 32 g/dL (ref 30.0–36.0)
MCV: 93 fL (ref 80.0–100.0)
Monocytes Absolute: 0.3 10*3/uL (ref 0.1–1.0)
Monocytes Relative: 7 %
Neutro Abs: 3.1 10*3/uL (ref 1.7–7.7)
Neutrophils Relative %: 71 %
Platelet Count: 170 10*3/uL (ref 150–400)
RBC: 4.16 MIL/uL — ABNORMAL LOW (ref 4.22–5.81)
RDW: 13.7 % (ref 11.5–15.5)
WBC Count: 4.3 10*3/uL (ref 4.0–10.5)
nRBC: 0 % (ref 0.0–0.2)

## 2023-08-15 LAB — CMP (CANCER CENTER ONLY)
ALT: 16 U/L (ref 0–44)
AST: 25 U/L (ref 15–41)
Albumin: 3.4 g/dL — ABNORMAL LOW (ref 3.5–5.0)
Alkaline Phosphatase: 90 U/L (ref 38–126)
Anion gap: 9 (ref 5–15)
BUN: 23 mg/dL (ref 8–23)
CO2: 26 mmol/L (ref 22–32)
Calcium: 9.3 mg/dL (ref 8.9–10.3)
Chloride: 104 mmol/L (ref 98–111)
Creatinine: 0.79 mg/dL (ref 0.61–1.24)
GFR, Estimated: >60 mL/min (ref >60)
Glucose, Bld: 105 mg/dL — ABNORMAL HIGH (ref 70–99)
Potassium: 4.3 mmol/L (ref 3.5–5.1)
Sodium: 139 mmol/L (ref 135–145)
Total Bilirubin: 0.7 mg/dL (ref 0.0–1.2)
Total Protein: 7.4 g/dL (ref 6.5–8.1)

## 2023-08-15 MED ORDER — PROCHLORPERAZINE MALEATE 10 MG PO TABS
10.0000 mg | ORAL_TABLET | Freq: Once | ORAL | Status: DC
  Filled 2023-08-15: qty 1

## 2023-08-15 MED ORDER — SODIUM CHLORIDE 0.9 % IV SOLN
Freq: Once | INTRAVENOUS | Status: AC
  Filled 2023-08-15: qty 250

## 2023-08-15 MED ORDER — SODIUM CHLORIDE 0.9 % IV SOLN
27.0000 mg/m2 | Freq: Once | INTRAVENOUS | Status: AC
  Administered 2023-08-15: 53.2 mg via INTRAVENOUS
  Filled 2023-08-15: qty 1.4
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
## 2023-08-15 NOTE — Assessment & Plan Note (Signed)
 Chemotherapy treatment as planned above

## 2023-08-15 NOTE — Assessment & Plan Note (Signed)
 Baseline dysuria and mild hematuria.  Anticipate symptoms may exacerbate during radiation.  Recommend patient to follow up with urology.

## 2023-08-15 NOTE — Assessment & Plan Note (Addendum)
 Urothelial carcinoma of prostatic urethra/bladder neck, cT2, muscle invasive.  He is not a candidate for radical cystectomy.  Recommend concurrent chemotherapy with radiation. Obtain CT chest to complete staging.SABRA  He has severe hearing loss of right ear, and moderate hearing loss of left ear. Cisplatin is not an ideal chemotherapy choice.  Recommend bi weekly Gemcitabine  27mg /m2 concurrently with radiation.  Labs are reviewed and discussed with patient. Proceed with  Gemcitabine  27mg /m2, follow up with Radonc for radiation.  He will get D18 Gemcitabine  later this week.

## 2023-08-16 ENCOUNTER — Other Ambulatory Visit: Payer: Self-pay

## 2023-08-16 ENCOUNTER — Ambulatory Visit
Admission: RE | Admit: 2023-08-16 | Discharge: 2023-08-16 | Disposition: A | Discharge status: Home / Self Care | Payer: Medicare Other | Source: Ambulatory Visit | Attending: Radiation Oncology | Admitting: Radiation Oncology

## 2023-08-16 DIAGNOSIS — C680 Malignant neoplasm of urethra: Secondary | ICD-10-CM | POA: Diagnosis not present

## 2023-08-16 DIAGNOSIS — Z5111 Encounter for antineoplastic chemotherapy: Secondary | ICD-10-CM | POA: Diagnosis not present

## 2023-08-16 DIAGNOSIS — C675 Malignant neoplasm of bladder neck: Secondary | ICD-10-CM | POA: Diagnosis not present

## 2023-08-16 DIAGNOSIS — Z7901 Long term (current) use of anticoagulants: Secondary | ICD-10-CM | POA: Diagnosis not present

## 2023-08-16 DIAGNOSIS — Z510 Encounter for antineoplastic radiation therapy: Secondary | ICD-10-CM | POA: Diagnosis not present

## 2023-08-16 DIAGNOSIS — R300 Dysuria: Secondary | ICD-10-CM | POA: Diagnosis not present

## 2023-08-16 DIAGNOSIS — Z86718 Personal history of other venous thrombosis and embolism: Secondary | ICD-10-CM | POA: Diagnosis not present

## 2023-08-16 LAB — RAD ONC ARIA SESSION SUMMARY
Course Elapsed Days: 15
Plan Fractions Treated to Date: 11
Plan Prescribed Dose Per Fraction: 1.8 Gy
Plan Total Fractions Prescribed: 25
Plan Total Prescribed Dose: 45 Gy
Reference Point Dosage Given to Date: 19.8 Gy
Reference Point Session Dosage Given: 1.8 Gy
Session Number: 11

## 2023-08-17 ENCOUNTER — Ambulatory Visit
Admission: RE | Admit: 2023-08-17 | Discharge: 2023-08-17 | Disposition: A | Discharge status: Home / Self Care | Payer: Medicare Other | Source: Ambulatory Visit | Attending: Radiation Oncology | Admitting: Radiation Oncology

## 2023-08-17 ENCOUNTER — Other Ambulatory Visit: Payer: Self-pay

## 2023-08-17 DIAGNOSIS — Z7901 Long term (current) use of anticoagulants: Secondary | ICD-10-CM | POA: Diagnosis not present

## 2023-08-17 DIAGNOSIS — R300 Dysuria: Secondary | ICD-10-CM | POA: Diagnosis not present

## 2023-08-17 DIAGNOSIS — Z86718 Personal history of other venous thrombosis and embolism: Secondary | ICD-10-CM | POA: Diagnosis not present

## 2023-08-17 DIAGNOSIS — Z5111 Encounter for antineoplastic chemotherapy: Secondary | ICD-10-CM | POA: Diagnosis not present

## 2023-08-17 DIAGNOSIS — Z510 Encounter for antineoplastic radiation therapy: Secondary | ICD-10-CM | POA: Diagnosis not present

## 2023-08-17 DIAGNOSIS — C680 Malignant neoplasm of urethra: Secondary | ICD-10-CM | POA: Diagnosis not present

## 2023-08-17 DIAGNOSIS — C675 Malignant neoplasm of bladder neck: Secondary | ICD-10-CM | POA: Diagnosis not present

## 2023-08-17 LAB — RAD ONC ARIA SESSION SUMMARY
Course Elapsed Days: 16
Plan Fractions Treated to Date: 12
Plan Prescribed Dose Per Fraction: 1.8 Gy
Plan Total Fractions Prescribed: 25
Plan Total Prescribed Dose: 45 Gy
Reference Point Dosage Given to Date: 21.6 Gy
Reference Point Session Dosage Given: 1.8 Gy
Session Number: 12

## 2023-08-18 ENCOUNTER — Inpatient Hospital Stay: Payer: Medicare Other

## 2023-08-18 ENCOUNTER — Ambulatory Visit
Admission: RE | Admit: 2023-08-18 | Discharge: 2023-08-18 | Disposition: A | Discharge status: Home / Self Care | Payer: Medicare Other | Source: Ambulatory Visit | Attending: Radiation Oncology | Admitting: Radiation Oncology

## 2023-08-18 ENCOUNTER — Other Ambulatory Visit: Payer: Self-pay

## 2023-08-18 ENCOUNTER — Ambulatory Visit: Payer: Medicare Other

## 2023-08-18 VITALS — BP 116/66 | HR 66 | Temp 96.5°F | Resp 16

## 2023-08-18 DIAGNOSIS — R300 Dysuria: Secondary | ICD-10-CM | POA: Diagnosis not present

## 2023-08-18 DIAGNOSIS — Z510 Encounter for antineoplastic radiation therapy: Secondary | ICD-10-CM | POA: Diagnosis not present

## 2023-08-18 DIAGNOSIS — C675 Malignant neoplasm of bladder neck: Secondary | ICD-10-CM | POA: Diagnosis not present

## 2023-08-18 DIAGNOSIS — C680 Malignant neoplasm of urethra: Secondary | ICD-10-CM | POA: Diagnosis not present

## 2023-08-18 DIAGNOSIS — Z86718 Personal history of other venous thrombosis and embolism: Secondary | ICD-10-CM | POA: Diagnosis not present

## 2023-08-18 DIAGNOSIS — Z7901 Long term (current) use of anticoagulants: Secondary | ICD-10-CM | POA: Diagnosis not present

## 2023-08-18 DIAGNOSIS — C689 Malignant neoplasm of urinary organ, unspecified: Secondary | ICD-10-CM

## 2023-08-18 DIAGNOSIS — Z5111 Encounter for antineoplastic chemotherapy: Secondary | ICD-10-CM | POA: Diagnosis not present

## 2023-08-18 LAB — CMP (CANCER CENTER ONLY)
ALT: 19 U/L (ref 0–44)
AST: 26 U/L (ref 15–41)
Albumin: 3.7 g/dL (ref 3.5–5.0)
Alkaline Phosphatase: 92 U/L (ref 38–126)
Anion gap: 9 (ref 5–15)
BUN: 22 mg/dL (ref 8–23)
CO2: 25 mmol/L (ref 22–32)
Calcium: 9.4 mg/dL (ref 8.9–10.3)
Chloride: 104 mmol/L (ref 98–111)
Creatinine: 0.77 mg/dL (ref 0.61–1.24)
GFR, Estimated: >60 mL/min (ref >60)
Glucose, Bld: 116 mg/dL — ABNORMAL HIGH (ref 70–99)
Potassium: 4.7 mmol/L (ref 3.5–5.1)
Sodium: 138 mmol/L (ref 135–145)
Total Bilirubin: 0.6 mg/dL (ref 0.0–1.2)
Total Protein: 7.2 g/dL (ref 6.5–8.1)

## 2023-08-18 LAB — CBC WITH DIFFERENTIAL (CANCER CENTER ONLY)
Abs Immature Granulocytes: 0.01 10*3/uL (ref 0.00–0.07)
Basophils Absolute: 0 10*3/uL (ref 0.0–0.1)
Basophils Relative: 0 %
Eosinophils Absolute: 0.2 10*3/uL (ref 0.0–0.5)
Eosinophils Relative: 4 %
HCT: 36.4 % — ABNORMAL LOW (ref 39.0–52.0)
Hemoglobin: 11.8 g/dL — ABNORMAL LOW (ref 13.0–17.0)
Immature Granulocytes: 0 %
Lymphocytes Relative: 13 %
Lymphs Abs: 0.6 10*3/uL — ABNORMAL LOW (ref 0.7–4.0)
MCH: 29.7 pg (ref 26.0–34.0)
MCHC: 32.4 g/dL (ref 30.0–36.0)
MCV: 91.7 fL (ref 80.0–100.0)
Monocytes Absolute: 0.3 10*3/uL (ref 0.1–1.0)
Monocytes Relative: 7 %
Neutro Abs: 3.4 10*3/uL (ref 1.7–7.7)
Neutrophils Relative %: 76 %
Platelet Count: 183 10*3/uL (ref 150–400)
RBC: 3.97 MIL/uL — ABNORMAL LOW (ref 4.22–5.81)
RDW: 14.2 % (ref 11.5–15.5)
WBC Count: 4.5 10*3/uL (ref 4.0–10.5)
nRBC: 0 % (ref 0.0–0.2)

## 2023-08-18 LAB — RAD ONC ARIA SESSION SUMMARY
Course Elapsed Days: 17
Plan Fractions Treated to Date: 13
Plan Prescribed Dose Per Fraction: 1.8 Gy
Plan Total Fractions Prescribed: 25
Plan Total Prescribed Dose: 45 Gy
Reference Point Dosage Given to Date: 23.4 Gy
Reference Point Session Dosage Given: 1.8 Gy
Session Number: 13

## 2023-08-18 MED ORDER — SODIUM CHLORIDE 0.9 % IV SOLN
27.0000 mg/m2 | Freq: Once | INTRAVENOUS | Status: AC
Start: 2023-08-18 — End: 2023-08-18
  Administered 2023-08-18: 53.2 mg via INTRAVENOUS
  Filled 2023-08-18: qty 1.4

## 2023-08-18 MED ORDER — PROCHLORPERAZINE MALEATE 10 MG PO TABS
10.0000 mg | ORAL_TABLET | Freq: Once | ORAL | Status: DC
Start: 2023-08-18 — End: 2023-08-18
  Filled 2023-08-18: qty 1

## 2023-08-18 MED ORDER — SODIUM CHLORIDE 0.9 % IV SOLN
Freq: Once | INTRAVENOUS | Status: AC
  Filled 2023-08-18: qty 250

## 2023-08-18 NOTE — Progress Notes (Signed)
Nutrition Assessment:  Referral for weight loss.  89 year old male with urothelial carcinoma.  Past medical history of CAD, STEMI, cardiomyopathy, HLD, HTN.  Patient receiving gemcitabine and radiation.    Met with patient and wife.  Patient reports that his appetite has been poor since starting treatment.  Foods don't taste right.  First meal of the day is usually around lunch time as he does not get up early.  May have eggs, sausage, toast or cereal.  Evening may have grilled cheese sandwich and soup or macaroni and cheese,tuna salad and crackers.  Has few teeth and trouble chewing.  Snacks on ice cream, candy.  Has tried the boost/ensure shakes.  Does not drink one every day.     Medications: MVI, miralax, prochlorperazine, ondansetron  Labs: Glucose 116  Anthropometrics:   Height: 72 inches Weight: 180 lb 187 lb on 12/18 (? Accuracy) 180 lb on 11/8 183 lb on 10/7 BMI: 24    Estimated Energy Needs  Kcals: 2025-2400 Protein: 101-120 g Fluid: 2025-2400 ml  NUTRITION DIAGNOSIS: Inadequate oral intake related to cancer related treatment side effects as evidenced by poor po intake and taste alterations.     INTERVENTION:  Discussed soft, moist foods high in calories and protein to include in diet.  Handout provided Recommend 350 calorie oral nutrition supplement.  Wrote examples down on handout. Coupons given Contact information provided    MONITORING, EVALUATION, GOAL: weight trends, intake   NEXT VISIT: to be determined during infusion  Montravious Weigelt B. Freida Busman, RD, LDN Registered Dietitian [phone]

## 2023-08-19 ENCOUNTER — Other Ambulatory Visit: Payer: Self-pay

## 2023-08-19 ENCOUNTER — Ambulatory Visit
Admission: RE | Admit: 2023-08-19 | Discharge: 2023-08-19 | Disposition: A | Discharge status: Home / Self Care | Payer: Medicare Other | Source: Ambulatory Visit | Attending: Radiation Oncology | Admitting: Radiation Oncology

## 2023-08-19 DIAGNOSIS — R300 Dysuria: Secondary | ICD-10-CM | POA: Diagnosis not present

## 2023-08-19 DIAGNOSIS — Z7901 Long term (current) use of anticoagulants: Secondary | ICD-10-CM | POA: Diagnosis not present

## 2023-08-19 DIAGNOSIS — Z86718 Personal history of other venous thrombosis and embolism: Secondary | ICD-10-CM | POA: Diagnosis not present

## 2023-08-19 DIAGNOSIS — Z510 Encounter for antineoplastic radiation therapy: Secondary | ICD-10-CM | POA: Diagnosis not present

## 2023-08-19 DIAGNOSIS — C680 Malignant neoplasm of urethra: Secondary | ICD-10-CM | POA: Diagnosis not present

## 2023-08-19 DIAGNOSIS — C675 Malignant neoplasm of bladder neck: Secondary | ICD-10-CM | POA: Diagnosis not present

## 2023-08-19 DIAGNOSIS — Z5111 Encounter for antineoplastic chemotherapy: Secondary | ICD-10-CM | POA: Diagnosis not present

## 2023-08-19 LAB — RAD ONC ARIA SESSION SUMMARY
Course Elapsed Days: 18
Plan Fractions Treated to Date: 14
Plan Prescribed Dose Per Fraction: 1.8 Gy
Plan Total Fractions Prescribed: 25
Plan Total Prescribed Dose: 45 Gy
Reference Point Dosage Given to Date: 25.2 Gy
Reference Point Session Dosage Given: 1.8 Gy
Session Number: 14

## 2023-08-22 ENCOUNTER — Other Ambulatory Visit: Payer: Self-pay

## 2023-08-22 ENCOUNTER — Ambulatory Visit
Admission: RE | Admit: 2023-08-22 | Discharge: 2023-08-22 | Disposition: A | Discharge status: Home / Self Care | Payer: Medicare Other | Source: Ambulatory Visit | Attending: Radiation Oncology | Admitting: Radiation Oncology

## 2023-08-22 DIAGNOSIS — R300 Dysuria: Secondary | ICD-10-CM | POA: Diagnosis not present

## 2023-08-22 DIAGNOSIS — Z510 Encounter for antineoplastic radiation therapy: Secondary | ICD-10-CM | POA: Diagnosis not present

## 2023-08-22 DIAGNOSIS — Z86718 Personal history of other venous thrombosis and embolism: Secondary | ICD-10-CM | POA: Diagnosis not present

## 2023-08-22 DIAGNOSIS — Z7901 Long term (current) use of anticoagulants: Secondary | ICD-10-CM | POA: Diagnosis not present

## 2023-08-22 DIAGNOSIS — Z5111 Encounter for antineoplastic chemotherapy: Secondary | ICD-10-CM | POA: Diagnosis not present

## 2023-08-22 DIAGNOSIS — C675 Malignant neoplasm of bladder neck: Secondary | ICD-10-CM | POA: Diagnosis not present

## 2023-08-22 DIAGNOSIS — C680 Malignant neoplasm of urethra: Secondary | ICD-10-CM | POA: Diagnosis not present

## 2023-08-22 LAB — RAD ONC ARIA SESSION SUMMARY
Course Elapsed Days: 21
Plan Fractions Treated to Date: 15
Plan Prescribed Dose Per Fraction: 1.8 Gy
Plan Total Fractions Prescribed: 25
Plan Total Prescribed Dose: 45 Gy
Reference Point Dosage Given to Date: 27 Gy
Reference Point Session Dosage Given: 1.8 Gy
Session Number: 15

## 2023-08-23 ENCOUNTER — Other Ambulatory Visit: Payer: Self-pay

## 2023-08-23 ENCOUNTER — Inpatient Hospital Stay: Payer: Medicare Other

## 2023-08-23 ENCOUNTER — Inpatient Hospital Stay (HOSPITAL_BASED_OUTPATIENT_CLINIC_OR_DEPARTMENT_OTHER): Payer: Medicare Other | Admitting: Oncology

## 2023-08-23 ENCOUNTER — Ambulatory Visit
Admission: RE | Admit: 2023-08-23 | Discharge: 2023-08-23 | Disposition: A | Discharge status: Home / Self Care | Payer: Medicare Other | Source: Ambulatory Visit | Attending: Radiation Oncology | Admitting: Radiation Oncology

## 2023-08-23 ENCOUNTER — Encounter: Payer: Self-pay | Admitting: Oncology

## 2023-08-23 VITALS — BP 122/71 | HR 77 | Temp 97.6°F | Resp 18 | Wt 181.7 lb

## 2023-08-23 DIAGNOSIS — C675 Malignant neoplasm of bladder neck: Secondary | ICD-10-CM | POA: Diagnosis not present

## 2023-08-23 DIAGNOSIS — C689 Malignant neoplasm of urinary organ, unspecified: Secondary | ICD-10-CM

## 2023-08-23 DIAGNOSIS — R300 Dysuria: Secondary | ICD-10-CM | POA: Diagnosis not present

## 2023-08-23 DIAGNOSIS — Z5111 Encounter for antineoplastic chemotherapy: Secondary | ICD-10-CM

## 2023-08-23 DIAGNOSIS — Z510 Encounter for antineoplastic radiation therapy: Secondary | ICD-10-CM | POA: Diagnosis not present

## 2023-08-23 DIAGNOSIS — Z86718 Personal history of other venous thrombosis and embolism: Secondary | ICD-10-CM | POA: Diagnosis not present

## 2023-08-23 DIAGNOSIS — Z7901 Long term (current) use of anticoagulants: Secondary | ICD-10-CM | POA: Diagnosis not present

## 2023-08-23 DIAGNOSIS — C680 Malignant neoplasm of urethra: Secondary | ICD-10-CM | POA: Diagnosis not present

## 2023-08-23 LAB — CBC WITH DIFFERENTIAL (CANCER CENTER ONLY)
Abs Immature Granulocytes: 0.01 10*3/uL (ref 0.00–0.07)
Basophils Absolute: 0 10*3/uL (ref 0.0–0.1)
Basophils Relative: 1 %
Eosinophils Absolute: 0.2 10*3/uL (ref 0.0–0.5)
Eosinophils Relative: 4 %
HCT: 36.9 % — ABNORMAL LOW (ref 39.0–52.0)
Hemoglobin: 12 g/dL — ABNORMAL LOW (ref 13.0–17.0)
Immature Granulocytes: 0 %
Lymphocytes Relative: 12 %
Lymphs Abs: 0.4 10*3/uL — ABNORMAL LOW (ref 0.7–4.0)
MCH: 30 pg (ref 26.0–34.0)
MCHC: 32.5 g/dL (ref 30.0–36.0)
MCV: 92.3 fL (ref 80.0–100.0)
Monocytes Absolute: 0.3 10*3/uL (ref 0.1–1.0)
Monocytes Relative: 8 %
Neutro Abs: 2.6 10*3/uL (ref 1.7–7.7)
Neutrophils Relative %: 75 %
Platelet Count: 213 10*3/uL (ref 150–400)
RBC: 4 MIL/uL — ABNORMAL LOW (ref 4.22–5.81)
RDW: 14.6 % (ref 11.5–15.5)
WBC Count: 3.5 10*3/uL — ABNORMAL LOW (ref 4.0–10.5)
nRBC: 0 % (ref 0.0–0.2)

## 2023-08-23 LAB — RAD ONC ARIA SESSION SUMMARY
Course Elapsed Days: 22
Plan Fractions Treated to Date: 16
Plan Prescribed Dose Per Fraction: 1.8 Gy
Plan Total Fractions Prescribed: 25
Plan Total Prescribed Dose: 45 Gy
Reference Point Dosage Given to Date: 28.8 Gy
Reference Point Session Dosage Given: 1.8 Gy
Session Number: 16

## 2023-08-23 LAB — CMP (CANCER CENTER ONLY)
ALT: 22 U/L (ref 0–44)
AST: 29 U/L (ref 15–41)
Albumin: 3.7 g/dL (ref 3.5–5.0)
Alkaline Phosphatase: 91 U/L (ref 38–126)
Anion gap: 8 (ref 5–15)
BUN: 18 mg/dL (ref 8–23)
CO2: 27 mmol/L (ref 22–32)
Calcium: 9.5 mg/dL (ref 8.9–10.3)
Chloride: 104 mmol/L (ref 98–111)
Creatinine: 0.86 mg/dL (ref 0.61–1.24)
GFR, Estimated: >60 mL/min (ref >60)
Glucose, Bld: 124 mg/dL — ABNORMAL HIGH (ref 70–99)
Potassium: 4.3 mmol/L (ref 3.5–5.1)
Sodium: 139 mmol/L (ref 135–145)
Total Bilirubin: 0.5 mg/dL (ref 0.0–1.2)
Total Protein: 7.4 g/dL (ref 6.5–8.1)

## 2023-08-23 MED ORDER — SODIUM CHLORIDE 0.9 % IV SOLN
27.0000 mg/m2 | Freq: Once | INTRAVENOUS | Status: AC
  Administered 2023-08-23: 53.2 mg via INTRAVENOUS
  Filled 2023-08-23: qty 1.4

## 2023-08-23 MED ORDER — SODIUM CHLORIDE 0.9 % IV SOLN
Freq: Once | INTRAVENOUS | Status: AC
  Filled 2023-08-23: qty 250

## 2023-08-23 MED ORDER — PROCHLORPERAZINE MALEATE 10 MG PO TABS
10.0000 mg | ORAL_TABLET | Freq: Once | ORAL | Status: AC
  Administered 2023-08-23: 10 mg via ORAL
  Filled 2023-08-23: qty 1
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
## 2023-08-23 NOTE — Assessment & Plan Note (Addendum)
Urothelial carcinoma of prostatic urethra/bladder neck, cT2, muscle invasive.  He is not a candidate for radical cystectomy.  Recommend concurrent chemotherapy with radiation. Obtain CT chest to complete staging.Marland Kitchen  He has severe hearing loss of right ear, and moderate hearing loss of left ear. Cisplatin is not an ideal chemotherapy choice.  Recommend bi weekly Gemcitabine 27mg /m2 concurrently with radiation.  Labs are reviewed and discussed with patient. Proceed with  Gemcitabine 27mg /m2, follow up with Radonc for radiation.  He will get D25 Gemcitabine later this week.

## 2023-08-23 NOTE — Assessment & Plan Note (Signed)
Chemotherapy treatment as planned above.  

## 2023-08-23 NOTE — Assessment & Plan Note (Signed)
Baseline dysuria and mild hematuria.  Anticipate symptoms may exacerbate during radiation.  follow up with urology.

## 2023-08-23 NOTE — Patient Instructions (Signed)
 [facility] - A DEPT OF [facility]  Discharge Instructions: Thank you for choosing [facility] to provide your oncology and hematology care.  If you have a lab appointment with the Cancer Center, please go directly to the Cancer Center and check in at the registration area.  Wear comfortable clothing and clothing appropriate for easy access to any Portacath or PICC line.   We strive to give you quality time with your provider. You may need to reschedule your appointment if you arrive late (15 or more minutes).  Arriving late affects you and other patients whose appointments are after yours.  Also, if you miss three or more appointments without notifying the office, you may be dismissed from the clinic at the provider's discretion.      For prescription refill requests, have your pharmacy contact our office and allow 72 hours for refills to be completed.    Today you received the following chemotherapy and/or immunotherapy agents Gemzar      To help prevent nausea and vomiting after your treatment, we encourage you to take your nausea medication as directed.  BELOW ARE SYMPTOMS THAT SHOULD BE REPORTED IMMEDIATELY: *FEVER GREATER THAN 100.4 F (38 C) OR HIGHER *CHILLS OR SWEATING *NAUSEA AND VOMITING THAT IS NOT CONTROLLED WITH YOUR NAUSEA MEDICATION *UNUSUAL SHORTNESS OF BREATH *UNUSUAL BRUISING OR BLEEDING *URINARY PROBLEMS (pain or burning when urinating, or frequent urination) *BOWEL PROBLEMS (unusual diarrhea, constipation, pain near the anus) TENDERNESS IN MOUTH AND THROAT WITH OR WITHOUT PRESENCE OF ULCERS (sore throat, sores in mouth, or a toothache) UNUSUAL RASH, SWELLING OR PAIN  UNUSUAL VAGINAL DISCHARGE OR ITCHING   Items with * indicate a potential emergency and should be followed up as soon as possible or go to the Emergency Department if any problems should occur.  Please show the CHEMOTHERAPY ALERT CARD or IMMUNOTHERAPY ALERT  CARD at check-in to the Emergency Department and triage nurse.  Should you have questions after your visit or need to cancel or reschedule your appointment, please contact [facility] - A DEPT OF [facility]  [phone] and follow the prompts.  Office hours are 8:00 a.m. to 4:30 p.m. Monday - Friday. Please note that voicemails left after 4:00 p.m. may not be returned until the following business day.  We are closed weekends and major holidays. You have access to a nurse at all times for urgent questions. Please call the main number to the clinic [phone] and follow the prompts.  For any non-urgent questions, you may also contact your provider using MyChart. We now offer e-Visits for anyone 85 and older to request care online for non-urgent symptoms. For details visit [email or web address].   Also download the MyChart app! Go to the app store, search "MyChart", open the app, select [place], and log in with your MyChart username and password.

## 2023-08-24 ENCOUNTER — Ambulatory Visit
Admission: RE | Admit: 2023-08-24 | Discharge: 2023-08-24 | Disposition: A | Discharge status: Home / Self Care | Payer: Medicare Other | Source: Ambulatory Visit | Attending: Radiation Oncology | Admitting: Radiation Oncology

## 2023-08-24 ENCOUNTER — Other Ambulatory Visit: Payer: Self-pay

## 2023-08-24 DIAGNOSIS — Z7901 Long term (current) use of anticoagulants: Secondary | ICD-10-CM | POA: Diagnosis not present

## 2023-08-24 DIAGNOSIS — R300 Dysuria: Secondary | ICD-10-CM | POA: Diagnosis not present

## 2023-08-24 DIAGNOSIS — C680 Malignant neoplasm of urethra: Secondary | ICD-10-CM | POA: Diagnosis not present

## 2023-08-24 DIAGNOSIS — C675 Malignant neoplasm of bladder neck: Secondary | ICD-10-CM | POA: Diagnosis not present

## 2023-08-24 DIAGNOSIS — Z510 Encounter for antineoplastic radiation therapy: Secondary | ICD-10-CM | POA: Diagnosis not present

## 2023-08-24 DIAGNOSIS — Z5111 Encounter for antineoplastic chemotherapy: Secondary | ICD-10-CM | POA: Diagnosis not present

## 2023-08-24 DIAGNOSIS — Z86718 Personal history of other venous thrombosis and embolism: Secondary | ICD-10-CM | POA: Diagnosis not present

## 2023-08-24 LAB — RAD ONC ARIA SESSION SUMMARY
Course Elapsed Days: 23
Plan Fractions Treated to Date: 17
Plan Prescribed Dose Per Fraction: 1.8 Gy
Plan Total Fractions Prescribed: 25
Plan Total Prescribed Dose: 45 Gy
Reference Point Dosage Given to Date: 30.6 Gy
Reference Point Session Dosage Given: 1.8 Gy
Session Number: 17

## 2023-08-25 ENCOUNTER — Ambulatory Visit
Admission: RE | Admit: 2023-08-25 | Discharge: 2023-08-25 | Disposition: A | Discharge status: Home / Self Care | Payer: Medicare Other | Source: Ambulatory Visit | Attending: Radiation Oncology | Admitting: Radiation Oncology

## 2023-08-25 ENCOUNTER — Other Ambulatory Visit: Payer: Self-pay

## 2023-08-25 DIAGNOSIS — Z510 Encounter for antineoplastic radiation therapy: Secondary | ICD-10-CM | POA: Diagnosis not present

## 2023-08-25 DIAGNOSIS — R300 Dysuria: Secondary | ICD-10-CM | POA: Diagnosis not present

## 2023-08-25 DIAGNOSIS — Z7901 Long term (current) use of anticoagulants: Secondary | ICD-10-CM | POA: Diagnosis not present

## 2023-08-25 DIAGNOSIS — Z5111 Encounter for antineoplastic chemotherapy: Secondary | ICD-10-CM | POA: Diagnosis not present

## 2023-08-25 DIAGNOSIS — Z86718 Personal history of other venous thrombosis and embolism: Secondary | ICD-10-CM | POA: Diagnosis not present

## 2023-08-25 DIAGNOSIS — C680 Malignant neoplasm of urethra: Secondary | ICD-10-CM | POA: Diagnosis not present

## 2023-08-25 DIAGNOSIS — C675 Malignant neoplasm of bladder neck: Secondary | ICD-10-CM | POA: Diagnosis not present

## 2023-08-25 LAB — RAD ONC ARIA SESSION SUMMARY
Course Elapsed Days: 24
Plan Fractions Treated to Date: 18
Plan Prescribed Dose Per Fraction: 1.8 Gy
Plan Total Fractions Prescribed: 25
Plan Total Prescribed Dose: 45 Gy
Reference Point Dosage Given to Date: 32.4 Gy
Reference Point Session Dosage Given: 1.8 Gy
Session Number: 18

## 2023-08-26 ENCOUNTER — Other Ambulatory Visit: Payer: Self-pay

## 2023-08-26 ENCOUNTER — Ambulatory Visit
Admission: RE | Admit: 2023-08-26 | Discharge: 2023-08-26 | Disposition: A | Discharge status: Home / Self Care | Payer: Medicare Other | Source: Ambulatory Visit | Attending: Radiation Oncology | Admitting: Radiation Oncology

## 2023-08-26 ENCOUNTER — Ambulatory Visit: Payer: Medicare Other

## 2023-08-26 ENCOUNTER — Inpatient Hospital Stay: Payer: Medicare Other

## 2023-08-26 ENCOUNTER — Ambulatory Visit: Payer: Medicare Other | Admitting: Oncology

## 2023-08-26 ENCOUNTER — Other Ambulatory Visit: Payer: Medicare Other

## 2023-08-26 VITALS — BP 125/70 | HR 66 | Temp 97.9°F | Resp 16

## 2023-08-26 DIAGNOSIS — Z5111 Encounter for antineoplastic chemotherapy: Secondary | ICD-10-CM | POA: Diagnosis not present

## 2023-08-26 DIAGNOSIS — C675 Malignant neoplasm of bladder neck: Secondary | ICD-10-CM | POA: Diagnosis not present

## 2023-08-26 DIAGNOSIS — C680 Malignant neoplasm of urethra: Secondary | ICD-10-CM | POA: Diagnosis not present

## 2023-08-26 DIAGNOSIS — Z7901 Long term (current) use of anticoagulants: Secondary | ICD-10-CM | POA: Diagnosis not present

## 2023-08-26 DIAGNOSIS — R300 Dysuria: Secondary | ICD-10-CM | POA: Diagnosis not present

## 2023-08-26 DIAGNOSIS — Z86718 Personal history of other venous thrombosis and embolism: Secondary | ICD-10-CM | POA: Diagnosis not present

## 2023-08-26 DIAGNOSIS — C689 Malignant neoplasm of urinary organ, unspecified: Secondary | ICD-10-CM

## 2023-08-26 DIAGNOSIS — Z510 Encounter for antineoplastic radiation therapy: Secondary | ICD-10-CM | POA: Diagnosis not present

## 2023-08-26 LAB — RAD ONC ARIA SESSION SUMMARY
Course Elapsed Days: 25
Plan Fractions Treated to Date: 19
Plan Prescribed Dose Per Fraction: 1.8 Gy
Plan Total Fractions Prescribed: 25
Plan Total Prescribed Dose: 45 Gy
Reference Point Dosage Given to Date: 34.2 Gy
Reference Point Session Dosage Given: 1.8 Gy
Session Number: 19

## 2023-08-26 MED ORDER — SODIUM CHLORIDE 0.9 % IV SOLN
Freq: Once | INTRAVENOUS | Status: AC
  Filled 2023-08-26: qty 250

## 2023-08-26 MED ORDER — SODIUM CHLORIDE 0.9 % IV SOLN
27.0000 mg/m2 | Freq: Once | INTRAVENOUS | Status: AC
  Administered 2023-08-26: 53.2 mg via INTRAVENOUS
  Filled 2023-08-26: qty 1.4

## 2023-08-26 MED ORDER — SODIUM CHLORIDE 0.9 % IV SOLN
Freq: Once | INTRAVENOUS | Status: DC
  Filled 2023-08-26: qty 250

## 2023-08-26 MED ORDER — PROCHLORPERAZINE MALEATE 10 MG PO TABS
10.0000 mg | ORAL_TABLET | Freq: Once | ORAL | Status: DC
Start: 2023-08-26 — End: 2023-08-26

## 2023-08-26 NOTE — Patient Instructions (Signed)
[facility] - A DEPT OF [facility]  Discharge Instructions: Thank you for choosing [facility] to provide your oncology and hematology care.  If you have a lab appointment with the Cancer Center, please go directly to the Cancer Center and check in at the registration area.  Wear comfortable clothing and clothing appropriate for easy access to any Portacath or PICC line.   We strive to give you quality time with your provider. You may need to reschedule your appointment if you arrive late (15 or more minutes).  Arriving late affects you and other patients whose appointments are after yours.  Also, if you miss three or more appointments without notifying the office, you may be dismissed from the clinic at the provider's discretion.      For prescription refill requests, have your pharmacy contact our office and allow 72 hours for refills to be completed.    Today you received the following chemotherapy and/or immunotherapy agents- gemzar      To help prevent nausea and vomiting after your treatment, we encourage you to take your nausea medication as directed.  BELOW ARE SYMPTOMS THAT SHOULD BE REPORTED IMMEDIATELY: *FEVER GREATER THAN 100.4 F (38 C) OR HIGHER *CHILLS OR SWEATING *NAUSEA AND VOMITING THAT IS NOT CONTROLLED WITH YOUR NAUSEA MEDICATION *UNUSUAL SHORTNESS OF BREATH *UNUSUAL BRUISING OR BLEEDING *URINARY PROBLEMS (pain or burning when urinating, or frequent urination) *BOWEL PROBLEMS (unusual diarrhea, constipation, pain near the anus) TENDERNESS IN MOUTH AND THROAT WITH OR WITHOUT PRESENCE OF ULCERS (sore throat, sores in mouth, or a toothache) UNUSUAL RASH, SWELLING OR PAIN  UNUSUAL VAGINAL DISCHARGE OR ITCHING   Items with * indicate a potential emergency and should be followed up as soon as possible or go to the Emergency Department if any problems should occur.  Please show the CHEMOTHERAPY ALERT CARD or IMMUNOTHERAPY  ALERT CARD at check-in to the Emergency Department and triage nurse.  Should you have questions after your visit or need to cancel or reschedule your appointment, please contact [facility] - A DEPT OF [facility]  [phone] and follow the prompts.  Office hours are 8:00 a.m. to 4:30 p.m. Monday - Friday. Please note that voicemails left after 4:00 p.m. may not be returned until the following business day.  We are closed weekends and major holidays. You have access to a nurse at all times for urgent questions. Please call the main number to the clinic [phone] and follow the prompts.  For any non-urgent questions, you may also contact your provider using MyChart. We now offer e-Visits for anyone 59 and older to request care online for non-urgent symptoms. For details visit [email or web address].   Also download the MyChart app! Go to the app store, search "MyChart", open the app, select [place], and log in with your MyChart username and password.

## 2023-08-29 ENCOUNTER — Other Ambulatory Visit: Payer: Self-pay

## 2023-08-29 ENCOUNTER — Ambulatory Visit
Admission: RE | Admit: 2023-08-29 | Discharge: 2023-08-29 | Disposition: A | Discharge status: Home / Self Care | Payer: Medicare Other | Source: Ambulatory Visit | Attending: Radiation Oncology | Admitting: Radiation Oncology

## 2023-08-29 DIAGNOSIS — R300 Dysuria: Secondary | ICD-10-CM | POA: Diagnosis not present

## 2023-08-29 DIAGNOSIS — Z5111 Encounter for antineoplastic chemotherapy: Secondary | ICD-10-CM | POA: Diagnosis not present

## 2023-08-29 DIAGNOSIS — Z7901 Long term (current) use of anticoagulants: Secondary | ICD-10-CM | POA: Diagnosis not present

## 2023-08-29 DIAGNOSIS — C680 Malignant neoplasm of urethra: Secondary | ICD-10-CM | POA: Diagnosis not present

## 2023-08-29 DIAGNOSIS — Z86718 Personal history of other venous thrombosis and embolism: Secondary | ICD-10-CM | POA: Diagnosis not present

## 2023-08-29 DIAGNOSIS — C675 Malignant neoplasm of bladder neck: Secondary | ICD-10-CM | POA: Diagnosis not present

## 2023-08-29 DIAGNOSIS — Z510 Encounter for antineoplastic radiation therapy: Secondary | ICD-10-CM | POA: Diagnosis not present

## 2023-08-29 LAB — RAD ONC ARIA SESSION SUMMARY
Course Elapsed Days: 28
Plan Fractions Treated to Date: 20
Plan Prescribed Dose Per Fraction: 1.8 Gy
Plan Total Fractions Prescribed: 25
Plan Total Prescribed Dose: 45 Gy
Reference Point Dosage Given to Date: 36 Gy
Reference Point Session Dosage Given: 1.8 Gy
Session Number: 20

## 2023-08-30 ENCOUNTER — Ambulatory Visit
Admission: RE | Admit: 2023-08-30 | Discharge: 2023-08-30 | Disposition: A | Discharge status: Home / Self Care | Payer: Medicare Other | Source: Ambulatory Visit | Attending: Radiation Oncology | Admitting: Radiation Oncology

## 2023-08-30 ENCOUNTER — Inpatient Hospital Stay (HOSPITAL_BASED_OUTPATIENT_CLINIC_OR_DEPARTMENT_OTHER): Payer: Medicare Other | Admitting: Oncology

## 2023-08-30 ENCOUNTER — Encounter: Payer: Self-pay | Admitting: Oncology

## 2023-08-30 ENCOUNTER — Other Ambulatory Visit: Payer: Self-pay

## 2023-08-30 ENCOUNTER — Inpatient Hospital Stay: Payer: Medicare Other

## 2023-08-30 VITALS — BP 121/67 | HR 77 | Temp 98.6°F | Resp 18 | Wt 181.4 lb

## 2023-08-30 DIAGNOSIS — C680 Malignant neoplasm of urethra: Secondary | ICD-10-CM | POA: Diagnosis not present

## 2023-08-30 DIAGNOSIS — R300 Dysuria: Secondary | ICD-10-CM

## 2023-08-30 DIAGNOSIS — R634 Abnormal weight loss: Secondary | ICD-10-CM | POA: Diagnosis not present

## 2023-08-30 DIAGNOSIS — C689 Malignant neoplasm of urinary organ, unspecified: Secondary | ICD-10-CM

## 2023-08-30 DIAGNOSIS — Z510 Encounter for antineoplastic radiation therapy: Secondary | ICD-10-CM | POA: Diagnosis not present

## 2023-08-30 DIAGNOSIS — Z7901 Long term (current) use of anticoagulants: Secondary | ICD-10-CM | POA: Diagnosis not present

## 2023-08-30 DIAGNOSIS — Z5111 Encounter for antineoplastic chemotherapy: Secondary | ICD-10-CM

## 2023-08-30 DIAGNOSIS — C675 Malignant neoplasm of bladder neck: Secondary | ICD-10-CM | POA: Diagnosis not present

## 2023-08-30 DIAGNOSIS — Z86718 Personal history of other venous thrombosis and embolism: Secondary | ICD-10-CM | POA: Diagnosis not present

## 2023-08-30 LAB — CMP (CANCER CENTER ONLY)
ALT: 22 U/L (ref 0–44)
AST: 29 U/L (ref 15–41)
Albumin: 3.7 g/dL (ref 3.5–5.0)
Alkaline Phosphatase: 85 U/L (ref 38–126)
Anion gap: 8 (ref 5–15)
BUN: 21 mg/dL (ref 8–23)
CO2: 26 mmol/L (ref 22–32)
Calcium: 9.5 mg/dL (ref 8.9–10.3)
Chloride: 103 mmol/L (ref 98–111)
Creatinine: 0.77 mg/dL (ref 0.61–1.24)
GFR, Estimated: >60 mL/min (ref >60)
Glucose, Bld: 103 mg/dL — ABNORMAL HIGH (ref 70–99)
Potassium: 4.5 mmol/L (ref 3.5–5.1)
Sodium: 137 mmol/L (ref 135–145)
Total Bilirubin: 0.7 mg/dL (ref 0.0–1.2)
Total Protein: 7.1 g/dL (ref 6.5–8.1)

## 2023-08-30 LAB — CBC WITH DIFFERENTIAL (CANCER CENTER ONLY)
Abs Immature Granulocytes: 0.03 10*3/uL (ref 0.00–0.07)
Basophils Absolute: 0 10*3/uL (ref 0.0–0.1)
Basophils Relative: 0 %
Eosinophils Absolute: 0.1 10*3/uL (ref 0.0–0.5)
Eosinophils Relative: 2 %
HCT: 36.7 % — ABNORMAL LOW (ref 39.0–52.0)
Hemoglobin: 12 g/dL — ABNORMAL LOW (ref 13.0–17.0)
Immature Granulocytes: 1 %
Lymphocytes Relative: 6 %
Lymphs Abs: 0.3 10*3/uL — ABNORMAL LOW (ref 0.7–4.0)
MCH: 30.6 pg (ref 26.0–34.0)
MCHC: 32.7 g/dL (ref 30.0–36.0)
MCV: 93.6 fL (ref 80.0–100.0)
Monocytes Absolute: 0.5 10*3/uL (ref 0.1–1.0)
Monocytes Relative: 10 %
Neutro Abs: 4.1 10*3/uL (ref 1.7–7.7)
Neutrophils Relative %: 81 %
Platelet Count: 194 10*3/uL (ref 150–400)
RBC: 3.92 MIL/uL — ABNORMAL LOW (ref 4.22–5.81)
RDW: 16.7 % — ABNORMAL HIGH (ref 11.5–15.5)
WBC Count: 5 10*3/uL (ref 4.0–10.5)
nRBC: 0.6 % — ABNORMAL HIGH (ref 0.0–0.2)

## 2023-08-30 LAB — RAD ONC ARIA SESSION SUMMARY
Course Elapsed Days: 29
Plan Fractions Treated to Date: 21
Plan Prescribed Dose Per Fraction: 1.8 Gy
Plan Total Fractions Prescribed: 25
Plan Total Prescribed Dose: 45 Gy
Reference Point Dosage Given to Date: 37.8 Gy
Reference Point Session Dosage Given: 1.8 Gy
Session Number: 21

## 2023-08-30 MED ORDER — SODIUM CHLORIDE 0.9 % IV SOLN
27.0000 mg/m2 | Freq: Once | INTRAVENOUS | Status: AC
  Administered 2023-08-30: 53.2 mg via INTRAVENOUS
  Filled 2023-08-30: qty 1.4

## 2023-08-30 MED ORDER — PROCHLORPERAZINE MALEATE 10 MG PO TABS
10.0000 mg | ORAL_TABLET | Freq: Once | ORAL | Status: AC
Start: 2023-08-30 — End: 2023-08-30
  Administered 2023-08-30: 10 mg via ORAL
  Filled 2023-08-30: qty 1

## 2023-08-30 MED ORDER — SODIUM CHLORIDE 0.9 % IV SOLN
Freq: Once | INTRAVENOUS | Status: AC
  Filled 2023-08-30: qty 250

## 2023-08-30 NOTE — Assessment & Plan Note (Signed)
Recommend patient to continue nutrition supplementation and follow up with nutritionist.

## 2023-08-30 NOTE — Assessment & Plan Note (Signed)
Baseline dysuria and mild hematuria.  Anticipate symptoms may exacerbate during radiation.  follow up with urology.

## 2023-08-30 NOTE — Progress Notes (Signed)
Pt here for follow up. No new concerns voiced.

## 2023-08-30 NOTE — Assessment & Plan Note (Signed)
Chemotherapy treatment as planned above
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
## 2023-08-30 NOTE — Patient Instructions (Signed)
[facility] - A DEPT OF [facility]  Discharge Instructions: Thank you for choosing [facility] to provide your oncology and hematology care.  If you have a lab appointment with the Cancer Center, please go directly to the Cancer Center and check in at the registration area.  Wear comfortable clothing and clothing appropriate for easy access to any Portacath or PICC line.   We strive to give you quality time with your provider. You may need to reschedule your appointment if you arrive late (15 or more minutes).  Arriving late affects you and other patients whose appointments are after yours.  Also, if you miss three or more appointments without notifying the office, you may be dismissed from the clinic at the provider's discretion.      For prescription refill requests, have your pharmacy contact our office and allow 72 hours for refills to be completed.    Today you received the following chemotherapy and/or immunotherapy agents- gemzar      To help prevent nausea and vomiting after your treatment, we encourage you to take your nausea medication as directed.  BELOW ARE SYMPTOMS THAT SHOULD BE REPORTED IMMEDIATELY: *FEVER GREATER THAN 100.4 F (38 C) OR HIGHER *CHILLS OR SWEATING *NAUSEA AND VOMITING THAT IS NOT CONTROLLED WITH YOUR NAUSEA MEDICATION *UNUSUAL SHORTNESS OF BREATH *UNUSUAL BRUISING OR BLEEDING *URINARY PROBLEMS (pain or burning when urinating, or frequent urination) *BOWEL PROBLEMS (unusual diarrhea, constipation, pain near the anus) TENDERNESS IN MOUTH AND THROAT WITH OR WITHOUT PRESENCE OF ULCERS (sore throat, sores in mouth, or a toothache) UNUSUAL RASH, SWELLING OR PAIN  UNUSUAL VAGINAL DISCHARGE OR ITCHING   Items with * indicate a potential emergency and should be followed up as soon as possible or go to the Emergency Department if any problems should occur.  Please show the CHEMOTHERAPY ALERT CARD or IMMUNOTHERAPY  ALERT CARD at check-in to the Emergency Department and triage nurse.  Should you have questions after your visit or need to cancel or reschedule your appointment, please contact [facility] - A DEPT OF [facility]  [phone] and follow the prompts.  Office hours are 8:00 a.m. to 4:30 p.m. Monday - Friday. Please note that voicemails left after 4:00 p.m. may not be returned until the following business day.  We are closed weekends and major holidays. You have access to a nurse at all times for urgent questions. Please call the main number to the clinic [phone] and follow the prompts.  For any non-urgent questions, you may also contact your provider using MyChart. We now offer e-Visits for anyone 59 and older to request care online for non-urgent symptoms. For details visit [email or web address].   Also download the MyChart app! Go to the app store, search "MyChart", open the app, select [place], and log in with your MyChart username and password.

## 2023-08-30 NOTE — Assessment & Plan Note (Addendum)
Urothelial carcinoma of prostatic urethra/bladder neck, cT2, muscle invasive.  He is not a candidate for radical cystectomy.  Recommend concurrent chemotherapy with radiation. Obtain CT chest to complete staging.Marland Kitchen  He has severe hearing loss of right ear, and moderate hearing loss of left ear. Cisplatin is not an ideal chemotherapy choice.  Recommend bi weekly Gemcitabine 27mg /m2 concurrently with radiation.  Labs are reviewed and discussed with patient. Proceed with cycle 2 D1 Gemcitabine 27mg /m2, follow up with Radonc for radiation.  He will get  D4 Gemcitabine later this week.

## 2023-08-31 ENCOUNTER — Other Ambulatory Visit: Payer: Self-pay

## 2023-08-31 ENCOUNTER — Ambulatory Visit
Admission: RE | Admit: 2023-08-31 | Discharge: 2023-08-31 | Disposition: A | Discharge status: Home / Self Care | Payer: Medicare Other | Source: Ambulatory Visit | Attending: Radiation Oncology | Admitting: Radiation Oncology

## 2023-08-31 DIAGNOSIS — Z86718 Personal history of other venous thrombosis and embolism: Secondary | ICD-10-CM | POA: Diagnosis not present

## 2023-08-31 DIAGNOSIS — Z5111 Encounter for antineoplastic chemotherapy: Secondary | ICD-10-CM | POA: Diagnosis not present

## 2023-08-31 DIAGNOSIS — C680 Malignant neoplasm of urethra: Secondary | ICD-10-CM | POA: Diagnosis not present

## 2023-08-31 DIAGNOSIS — Z510 Encounter for antineoplastic radiation therapy: Secondary | ICD-10-CM | POA: Diagnosis not present

## 2023-08-31 DIAGNOSIS — R300 Dysuria: Secondary | ICD-10-CM | POA: Diagnosis not present

## 2023-08-31 DIAGNOSIS — C675 Malignant neoplasm of bladder neck: Secondary | ICD-10-CM | POA: Diagnosis not present

## 2023-08-31 DIAGNOSIS — Z7901 Long term (current) use of anticoagulants: Secondary | ICD-10-CM | POA: Diagnosis not present

## 2023-08-31 LAB — RAD ONC ARIA SESSION SUMMARY
Course Elapsed Days: 30
Plan Fractions Treated to Date: 22
Plan Prescribed Dose Per Fraction: 1.8 Gy
Plan Total Fractions Prescribed: 25
Plan Total Prescribed Dose: 45 Gy
Reference Point Dosage Given to Date: 39.6 Gy
Reference Point Session Dosage Given: 1.8 Gy
Session Number: 22

## 2023-09-01 ENCOUNTER — Ambulatory Visit
Admission: RE | Admit: 2023-09-01 | Discharge: 2023-09-01 | Disposition: A | Discharge status: Home / Self Care | Payer: Medicare Other | Source: Ambulatory Visit | Attending: Radiation Oncology | Admitting: Radiation Oncology

## 2023-09-01 ENCOUNTER — Other Ambulatory Visit: Payer: Self-pay

## 2023-09-01 DIAGNOSIS — Z7901 Long term (current) use of anticoagulants: Secondary | ICD-10-CM | POA: Diagnosis not present

## 2023-09-01 DIAGNOSIS — Z510 Encounter for antineoplastic radiation therapy: Secondary | ICD-10-CM | POA: Diagnosis not present

## 2023-09-01 DIAGNOSIS — R300 Dysuria: Secondary | ICD-10-CM | POA: Diagnosis not present

## 2023-09-01 DIAGNOSIS — Z5111 Encounter for antineoplastic chemotherapy: Secondary | ICD-10-CM | POA: Diagnosis not present

## 2023-09-01 DIAGNOSIS — Z86718 Personal history of other venous thrombosis and embolism: Secondary | ICD-10-CM | POA: Diagnosis not present

## 2023-09-01 DIAGNOSIS — C675 Malignant neoplasm of bladder neck: Secondary | ICD-10-CM | POA: Diagnosis not present

## 2023-09-01 DIAGNOSIS — C680 Malignant neoplasm of urethra: Secondary | ICD-10-CM | POA: Diagnosis not present

## 2023-09-01 LAB — RAD ONC ARIA SESSION SUMMARY
Course Elapsed Days: 31
Plan Fractions Treated to Date: 23
Plan Prescribed Dose Per Fraction: 1.8 Gy
Plan Total Fractions Prescribed: 25
Plan Total Prescribed Dose: 45 Gy
Reference Point Dosage Given to Date: 41.4 Gy
Reference Point Session Dosage Given: 1.8 Gy
Session Number: 23

## 2023-09-02 ENCOUNTER — Other Ambulatory Visit: Payer: Self-pay

## 2023-09-02 ENCOUNTER — Ambulatory Visit: Payer: Medicare Other

## 2023-09-02 ENCOUNTER — Emergency Department: Payer: Medicare Other

## 2023-09-02 ENCOUNTER — Emergency Department
Admission: EM | Admit: 2023-09-02 | Discharge: 2023-09-03 | Disposition: A | Discharge status: Home / Self Care | Payer: Medicare Other | Attending: Emergency Medicine | Admitting: Emergency Medicine

## 2023-09-02 ENCOUNTER — Inpatient Hospital Stay: Payer: Medicare Other

## 2023-09-02 ENCOUNTER — Ambulatory Visit
Admission: RE | Admit: 2023-09-02 | Discharge: 2023-09-02 | Disposition: A | Discharge status: Home / Self Care | Payer: Medicare Other | Source: Ambulatory Visit | Attending: Radiation Oncology | Admitting: Radiation Oncology

## 2023-09-02 ENCOUNTER — Encounter: Payer: Self-pay | Admitting: Internal Medicine

## 2023-09-02 VITALS — BP 145/73 | HR 77 | Temp 97.2°F | Resp 18

## 2023-09-02 DIAGNOSIS — J09X2 Influenza due to identified novel influenza A virus with other respiratory manifestations: Secondary | ICD-10-CM | POA: Diagnosis not present

## 2023-09-02 DIAGNOSIS — C679 Malignant neoplasm of bladder, unspecified: Secondary | ICD-10-CM | POA: Insufficient documentation

## 2023-09-02 DIAGNOSIS — A419 Sepsis, unspecified organism: Secondary | ICD-10-CM | POA: Insufficient documentation

## 2023-09-02 DIAGNOSIS — J101 Influenza due to other identified influenza virus with other respiratory manifestations: Secondary | ICD-10-CM | POA: Diagnosis not present

## 2023-09-02 DIAGNOSIS — C675 Malignant neoplasm of bladder neck: Secondary | ICD-10-CM | POA: Diagnosis not present

## 2023-09-02 DIAGNOSIS — Z86718 Personal history of other venous thrombosis and embolism: Secondary | ICD-10-CM | POA: Diagnosis not present

## 2023-09-02 DIAGNOSIS — Z20822 Contact with and (suspected) exposure to covid-19: Secondary | ICD-10-CM | POA: Diagnosis not present

## 2023-09-02 DIAGNOSIS — R509 Fever, unspecified: Secondary | ICD-10-CM | POA: Diagnosis not present

## 2023-09-02 DIAGNOSIS — R300 Dysuria: Secondary | ICD-10-CM | POA: Diagnosis not present

## 2023-09-02 DIAGNOSIS — Z7901 Long term (current) use of anticoagulants: Secondary | ICD-10-CM | POA: Diagnosis not present

## 2023-09-02 DIAGNOSIS — Z5111 Encounter for antineoplastic chemotherapy: Secondary | ICD-10-CM | POA: Diagnosis not present

## 2023-09-02 DIAGNOSIS — D849 Immunodeficiency, unspecified: Secondary | ICD-10-CM

## 2023-09-02 DIAGNOSIS — Z510 Encounter for antineoplastic radiation therapy: Secondary | ICD-10-CM | POA: Diagnosis not present

## 2023-09-02 DIAGNOSIS — C689 Malignant neoplasm of urinary organ, unspecified: Secondary | ICD-10-CM

## 2023-09-02 DIAGNOSIS — C680 Malignant neoplasm of urethra: Secondary | ICD-10-CM | POA: Diagnosis not present

## 2023-09-02 LAB — CBC WITH DIFFERENTIAL/PLATELET
Abs Immature Granulocytes: 0.02 10*3/uL (ref 0.00–0.07)
Basophils Absolute: 0 10*3/uL (ref 0.0–0.1)
Basophils Relative: 0 %
Eosinophils Absolute: 0 10*3/uL (ref 0.0–0.5)
Eosinophils Relative: 0 %
HCT: 33.1 % — ABNORMAL LOW (ref 39.0–52.0)
Hemoglobin: 10.9 g/dL — ABNORMAL LOW (ref 13.0–17.0)
Immature Granulocytes: 0 %
Lymphocytes Relative: 3 %
Lymphs Abs: 0.1 10*3/uL — ABNORMAL LOW (ref 0.7–4.0)
MCH: 30.8 pg (ref 26.0–34.0)
MCHC: 32.9 g/dL (ref 30.0–36.0)
MCV: 93.5 fL (ref 80.0–100.0)
Monocytes Absolute: 0.5 10*3/uL (ref 0.1–1.0)
Monocytes Relative: 10 %
Neutro Abs: 4.1 10*3/uL (ref 1.7–7.7)
Neutrophils Relative %: 87 %
Platelets: 156 10*3/uL (ref 150–400)
RBC: 3.54 MIL/uL — ABNORMAL LOW (ref 4.22–5.81)
RDW: 17 % — ABNORMAL HIGH (ref 11.5–15.5)
WBC: 4.8 10*3/uL (ref 4.0–10.5)
nRBC: 0 % (ref 0.0–0.2)

## 2023-09-02 LAB — COMPREHENSIVE METABOLIC PANEL
ALT: 23 U/L (ref 0–44)
AST: 33 U/L (ref 15–41)
Albumin: 3.2 g/dL — ABNORMAL LOW (ref 3.5–5.0)
Alkaline Phosphatase: 65 U/L (ref 38–126)
Anion gap: 11 (ref 5–15)
BUN: 23 mg/dL (ref 8–23)
CO2: 23 mmol/L (ref 22–32)
Calcium: 8.8 mg/dL — ABNORMAL LOW (ref 8.9–10.3)
Chloride: 102 mmol/L (ref 98–111)
Creatinine, Ser: 1.01 mg/dL (ref 0.61–1.24)
GFR, Estimated: >60 mL/min (ref >60)
Glucose, Bld: 115 mg/dL — ABNORMAL HIGH (ref 70–99)
Potassium: 4.4 mmol/L (ref 3.5–5.1)
Sodium: 136 mmol/L (ref 135–145)
Total Bilirubin: 0.7 mg/dL (ref 0.0–1.2)
Total Protein: 6.4 g/dL — ABNORMAL LOW (ref 6.5–8.1)

## 2023-09-02 LAB — RAD ONC ARIA SESSION SUMMARY
Course Elapsed Days: 32
Plan Fractions Treated to Date: 24
Plan Prescribed Dose Per Fraction: 1.8 Gy
Plan Total Fractions Prescribed: 25
Plan Total Prescribed Dose: 45 Gy
Reference Point Dosage Given to Date: 43.2 Gy
Reference Point Session Dosage Given: 1.8 Gy
Session Number: 24

## 2023-09-02 LAB — RESP PANEL BY RT-PCR (RSV, FLU A&B, COVID)  RVPGX2
Influenza A by PCR: POSITIVE — AB
Influenza B by PCR: NEGATIVE
Resp Syncytial Virus by PCR: NEGATIVE
SARS Coronavirus 2 by RT PCR: NEGATIVE

## 2023-09-02 LAB — LACTIC ACID, PLASMA: Lactic Acid, Venous: 1.1 mmol/L (ref 0.5–1.9)

## 2023-09-02 LAB — PROCALCITONIN: Procalcitonin: 0.52 ng/mL

## 2023-09-02 MED ORDER — SODIUM CHLORIDE 0.9 % IV SOLN
27.0000 mg/m2 | Freq: Once | INTRAVENOUS | Status: AC
  Administered 2023-09-02: 53.2 mg via INTRAVENOUS
  Filled 2023-09-02: qty 1.4

## 2023-09-02 MED ORDER — METRONIDAZOLE 500 MG/100ML IV SOLN
500.0000 mg | Freq: Once | INTRAVENOUS | Status: DC
  Administered 2023-09-03: 500 mg via INTRAVENOUS
  Filled 2023-09-02: qty 100

## 2023-09-02 MED ORDER — VANCOMYCIN HCL IN DEXTROSE 1-5 GM/200ML-% IV SOLN: 1000.0000 mg | Freq: Once | INTRAVENOUS | Status: DC

## 2023-09-02 MED ORDER — LACTATED RINGERS IV BOLUS
1000.0000 mL | Freq: Once | INTRAVENOUS | Status: AC
  Administered 2023-09-02: 1000 mL via INTRAVENOUS

## 2023-09-02 MED ORDER — SODIUM CHLORIDE 0.9 % IV SOLN
Freq: Once | INTRAVENOUS | Status: AC
  Filled 2023-09-02: qty 250

## 2023-09-02 MED ORDER — SODIUM CHLORIDE 0.9 % IV SOLN
2.0000 g | Freq: Once | INTRAVENOUS | Status: AC
  Administered 2023-09-02: 2 g via INTRAVENOUS
  Filled 2023-09-02: qty 12.5

## 2023-09-02 MED ORDER — PROCHLORPERAZINE MALEATE 10 MG PO TABS
10.0000 mg | ORAL_TABLET | Freq: Once | ORAL | Status: AC
  Administered 2023-09-02: 10 mg via ORAL
  Filled 2023-09-02: qty 1

## 2023-09-02 NOTE — Progress Notes (Signed)
Wife called patient having a fever of 102.  Recommend go to the emergency room for further evaluation.

## 2023-09-02 NOTE — Progress Notes (Signed)
   09/02/23 1915  Spiritual Encounters  Type of Visit Initial  Care provided to: Patient;Family  Conversation partners present during encounter Nurse  Reason for visit Routine spiritual support  OnCall Visit Yes  Spiritual Care Plan  Spiritual Care Issues Still Outstanding No further spiritual care needs at this time (see row info)   Chaplain responded to patient need for a pillow and blanket while they were waiting to medical care.  Staff assisted Chaplain in getting that for patient to meet their immediate need.    Rev. Rana M. Earlene Plater, MDiv. Chaplain Resident  [facility]

## 2023-09-02 NOTE — ED Provider Notes (Signed)
-----------------------------------------   11:36 PM on 09/02/2023 -----------------------------------------  Assuming care from Dr. Fuller Plan.  In short, STETSON PELAEZ is a 89 y.o. male with a chief complaint of fever in the setting of chemotherapy.  Refer to the original H&P for additional details.  The current plan of care is to follow up on procalcitonin and consult heme/onc for disposition recommendations.   Clinical Course as of 09/03/23 0221  Sat Sep 03, 2023  0025 I consulted with Dr. Donneta Romberg with oncology.  I explained the patient has a fever but is influenza A positive and his lab work is generally reassuring other than a procalcitonin which is at the lower end of abnormal.  He reviewed the data and feels that it is appropriate to send the patient home as long as he feels well enough to do so.  He recommended that I initiate Tamiflu after we discussed the risks and benefits, but given that the patient is immunocompromise it likely makes sense in his case.  I explained that we have 1 blood culture but do not have a second 1 yet but he said that it is okay to just go with the 1.  I will discuss with the patient and his wife and anticipate discharge with outpatient follow-up with Dr. Cathie Hoops [CF]  [phone] Patient stable and essentially asymptomatic at this time.  He and his wife are comfortable with the plan for discharge.  I reiterated my return precautions [CF]  0050 I had a discussion with Princella Ion with the pharmacy.  The patient's creatinine clearance is just above 60 mL/min or just below it depending on whether total body weight or ideal body weight are utilized.  Ultimately we decided to stick with the regular recommended dose of 75 mg twice daily since it was so close to the cutoff. [CF]    Clinical Course User Index [CF] Loleta Rose, MD     Medications  ceFEPIme (MAXIPIME) 2 g in sodium chloride 0.9 % 100 mL IVPB (0 g Intravenous Stopped 09/03/23 0002)  lactated ringers bolus 1,000  mL (0 mLs Intravenous Stopped 09/03/23 0053)  lactated ringers bolus 1,000 mL (0 mLs Intravenous Stopped 09/03/23 0053)  oseltamivir (TAMIFLU) capsule 75 mg (75 mg Oral Given 09/03/23 0100)     ED Discharge Orders          Ordered    oseltamivir (TAMIFLU) 75 MG capsule  2 times daily        09/03/23 0041           Final diagnoses:  Sepsis, due to unspecified organism, unspecified whether acute organ dysfunction present [facility])  Influenza A  Immunocompromised state (HCC)     Loleta Rose, MD 09/03/23 0221

## 2023-09-02 NOTE — Progress Notes (Signed)
 Pt being followed by [facility] for Sepsis protocol.

## 2023-09-02 NOTE — ED Triage Notes (Signed)
Pt to ed from home via POV for fever. Pt wife advised he spiked a fever today at home around 12pm ov 101 and they called the MD and they told her to come here. Pt is an active cancer treatment patient. Pt has no other complaints.

## 2023-09-02 NOTE — ED Provider Notes (Signed)
[facility] Provider Note    Event Date/Time   First MD Initiated Contact with Patient 09/02/23 2210     (approximate)   History   Fever   HPI  [name] is a 89 y.o. male  with Urothelial cancer on chemotherapy, radiation and had his treatment done today who comes in with concerns for fever.  Family report a fever of 102.  Did get Tylenol before coming in.  He has reported a cough.  He denies any abdominal pain or other symptoms.   Physical Exam   Triage Vital Signs: ED Triage Vitals [09/02/23 2139]  Encounter Vitals Group     BP      Systolic BP Percentile      Diastolic BP Percentile      Pulse      Resp      Temp      Temp src      SpO2      Weight      Height      Head Circumference      Peak Flow      Pain Score 0     Pain Loc      Pain Education      Exclude from Growth Chart     Most recent vital signs: There were no vitals filed for this visit.   General: Awake, no distress.  CV:  Good peripheral perfusion.  Resp:  Normal effort.  Abd:  No distention.  Soft and nontender Other:  No swelling in legs.  No calf tenderness   ED Results / Procedures / Treatments   Labs (all labs ordered are listed, but only abnormal results are displayed) Labs Reviewed  RESP PANEL BY RT-PCR (RSV, FLU A&B, COVID)  [id number]  LACTIC ACID, PLASMA  LACTIC ACID, PLASMA  COMPREHENSIVE METABOLIC PANEL  CBC WITH DIFFERENTIAL/PLATELET  URINALYSIS, W/ REFLEX TO CULTURE (INFECTION SUSPECTED)     EKG  My interpretation of EKG:    RADIOLOGY I have reviewed the xray personally and interpreted no evidence of any pneumonia   PROCEDURES:  Critical Care performed: Yes, see critical care procedure note(s)  .Critical Care  Performed by: Concha Se, MD Authorized by: Concha Se, MD   Critical care provider statement:    Critical care time (minutes):  30   Critical care was necessary to treat or prevent imminent or life-threatening  deterioration of the following conditions:  Sepsis   Critical care was time spent personally by me on the following activities:  Development of treatment plan with patient or surrogate, discussions with consultants, evaluation of patient's response to treatment, examination of patient, ordering and review of laboratory studies, ordering and review of radiographic studies, ordering and performing treatments and interventions, pulse oximetry, re-evaluation of patient's condition and review of old charts    MEDICATIONS ORDERED IN ED: Medications  ceFEPIme (MAXIPIME) 2 g in sodium chloride 0.9 % 100 mL IVPB (has no administration in time range)  metroNIDAZOLE (FLAGYL) IVPB 500 mg (has no administration in time range)  vancomycin (VANCOCIN) IVPB 1000 mg/200 mL premix (has no administration in time range)  lactated ringers bolus 1,000 mL (has no administration in time range)  lactated ringers bolus 1,000 mL (has no administration in time range)     IMPRESSION / MDM / ASSESSMENT AND PLAN / ED COURSE  I reviewed the triage vital signs and the nursing notes.   Patient's presentation is most consistent with acute presentation with  potential threat to life or bodily function.   Patient comes in with concerns for fevers in the setting of chemotherapy, radiation.  His abdomen is soft and nontender suspect COVID, flu, pneumonia given he reports a cough.  Blood cultures, lactate ordered.  Given he is immunosuppressed started on broad-spectrum antibiotics.  I will give 2 L per ideal body weight and antibiotics.  Patient be handed off pending blood work and final disposition.    FINAL CLINICAL IMPRESSION(S) / ED DIAGNOSES   Final diagnoses:  Sepsis, due to unspecified organism, unspecified whether acute organ dysfunction present [facility])     Rx / DC Orders   ED Discharge Orders     None        Note:  This document was prepared using Dragon voice recognition software and may include  unintentional dictation errors.   Concha Se, MD 09/02/23 2233

## 2023-09-02 NOTE — Patient Instructions (Addendum)
[facility] - A DEPT OF [facility]  Discharge Instructions: Thank you for choosing [facility] to provide your oncology and hematology care.  If you have a lab appointment with the Cancer Center, please go directly to the Cancer Center and check in at the registration area.  Wear comfortable clothing and clothing appropriate for easy access to any Portacath or PICC line.   We strive to give you quality time with your provider. You may need to reschedule your appointment if you arrive late (15 or more minutes).  Arriving late affects you and other patients whose appointments are after yours.  Also, if you miss three or more appointments without notifying the office, you may be dismissed from the clinic at the provider's discretion.      For prescription refill requests, have your pharmacy contact our office and allow 72 hours for refills to be completed.    Today you received the following chemotherapy and/or immunotherapy agents GEMZAR       To help prevent nausea and vomiting after your treatment, we encourage you to take your nausea medication as directed.  BELOW ARE SYMPTOMS THAT SHOULD BE REPORTED IMMEDIATELY: *FEVER GREATER THAN 100.4 F (38 C) OR HIGHER *CHILLS OR SWEATING *NAUSEA AND VOMITING THAT IS NOT CONTROLLED WITH YOUR NAUSEA MEDICATION *UNUSUAL SHORTNESS OF BREATH *UNUSUAL BRUISING OR BLEEDING *URINARY PROBLEMS (pain or burning when urinating, or frequent urination) *BOWEL PROBLEMS (unusual diarrhea, constipation, pain near the anus) TENDERNESS IN MOUTH AND THROAT WITH OR WITHOUT PRESENCE OF ULCERS (sore throat, sores in mouth, or a toothache) UNUSUAL RASH, SWELLING OR PAIN  UNUSUAL VAGINAL DISCHARGE OR ITCHING   Items with * indicate a potential emergency and should be followed up as soon as possible or go to the Emergency Department if any problems should occur.  Please show the CHEMOTHERAPY ALERT CARD or IMMUNOTHERAPY  ALERT CARD at check-in to the Emergency Department and triage nurse.  Should you have questions after your visit or need to cancel or reschedule your appointment, please contact [facility] - A DEPT OF [facility])  [phone] and follow the prompts.  Office hours are 8:00 a.m. to 4:30 p.m. Monday - Friday. Please note that voicemails left after 4:00 p.m. may not be returned until the following business day.  We are closed weekends and major holidays. You have access to a nurse at all times for urgent questions. Please call the main number to the clinic [phone] and follow the prompts.  For any non-urgent questions, you may also contact your provider using MyChart. We now offer e-Visits for anyone 73 and older to request care online for non-urgent symptoms. For details visit [email or web address].   Also download the MyChart app! Go to the app store, search "MyChart", open the app, select [place], and log in with your MyChart username and password.  Gemcitabine Injection What is this medication? GEMCITABINE (jem SYE ta been) treats some types of cancer. It works by slowing down the growth of cancer cells. This medicine may be used for other purposes; ask your health care provider or pharmacist if you have questions. COMMON BRAND NAME(S): Gemzar, Infugem What should I tell my care team before I take this medication? They need to know if you have any of these conditions: Blood disorders Infection Kidney disease Liver disease Lung or breathing disease, such as asthma or COPD Recent or ongoing radiation therapy An unusual or  allergic reaction to gemcitabine, other medications, foods, dyes, or preservatives If you or your partner are pregnant or trying to get pregnant Breast-feeding How should I use this medication? This medication is injected into a vein. It is given by your care team in a hospital or clinic setting. Talk to your care team about the  use of this medication in children. Special care may be needed. Overdosage: If you think you have taken too much of this medicine contact a poison control center or emergency room at once. NOTE: This medicine is only for you. Do not share this medicine with others. What if I miss a dose? Keep appointments for follow-up doses. It is important not to miss your dose. Call your care team if you are unable to keep an appointment. What may interact with this medication? Interactions have not been studied. This list may not describe all possible interactions. Give your health care provider a list of all the medicines, herbs, non-prescription drugs, or dietary supplements you use. Also tell them if you smoke, drink alcohol, or use illegal drugs. Some items may interact with your medicine. What should I watch for while using this medication? Your condition will be monitored carefully while you are receiving this medication. This medication may make you feel generally unwell. This is not uncommon, as chemotherapy can affect healthy cells as well as cancer cells. Report any side effects. Continue your course of treatment even though you feel ill unless your care team tells you to stop. In some cases, you may be given additional medications to help with side effects. Follow all directions for their use. This medication may increase your risk of getting an infection. Call your care team for advice if you get a fever, chills, sore throat, or other symptoms of a cold or flu. Do not treat yourself. Try to avoid being around people who are sick. This medication may increase your risk to bruise or bleed. Call your care team if you notice any unusual bleeding. Be careful brushing or flossing your teeth or using a toothpick because you may get an infection or bleed more easily. If you have any dental work done, tell your dentist you are receiving this medication. Avoid taking medications that contain aspirin,  acetaminophen, ibuprofen, naproxen, or ketoprofen unless instructed by your care team. These medications may hide a fever. Talk to your care team if you or your partner wish to become pregnant or think you might be pregnant. This medication can cause serious birth defects if taken during pregnancy and for 6 months after the last dose. A negative pregnancy test is required before starting this medication. A reliable form of contraception is recommended while taking this medication and for 6 months after the last dose. Talk to your care team about effective forms of contraception. Do not father a child while taking this medication and for 3 months after the last dose. Use a condom while having sex during this time period. Do not breastfeed while taking this medication and for at least 1 week after the last dose. This medication may cause infertility. Talk to your care team if you are concerned about your fertility. What side effects may I notice from receiving this medication? Side effects that you should report to your care team as soon as possible: Allergic reactions--skin rash, itching, hives, swelling of the face, lips, tongue, or throat Capillary leak syndrome--stomach or muscle pain, unusual weakness or fatigue, feeling faint or lightheaded, decrease in the amount of urine, swelling  of the ankles, hands, or feet, trouble breathing Infection--fever, chills, cough, sore throat, wounds that don't heal, pain or trouble when passing urine, general feeling of discomfort or being unwell Liver injury--right upper belly pain, loss of appetite, nausea, light-colored stool, dark yellow or brown urine, yellowing skin or eyes, unusual weakness or fatigue Low red blood cell level--unusual weakness or fatigue, dizziness, headache, trouble breathing Lung injury--shortness of breath or trouble breathing, cough, spitting up blood, chest pain, fever Stomach pain, bloody diarrhea, pale skin, unusual weakness or fatigue,  decrease in the amount of urine, which may be signs of hemolytic uremic syndrome Sudden and severe headache, confusion, change in vision, seizures, which may be signs of posterior reversible encephalopathy syndrome (PRES) Unusual bruising or bleeding Side effects that usually do not require medical attention (report to your care team if they continue or are bothersome): Diarrhea Drowsiness Hair loss Nausea Pain, redness, or swelling with sores inside the mouth or throat Vomiting This list may not describe all possible side effects. Call your doctor for medical advice about side effects. You may report side effects to FDA at 1-800-FDA-1088. Where should I keep my medication? This medication is given in a hospital or clinic. It will not be stored at home. NOTE: This sheet is a summary. It may not cover all possible information. If you have questions about this medicine, talk to your doctor, pharmacist, or health care provider.  2024 Elsevier/Gold Standard (2021-11-24 00:00:00) \[id number]\

## 2023-09-03 DIAGNOSIS — A419 Sepsis, unspecified organism: Secondary | ICD-10-CM | POA: Diagnosis not present

## 2023-09-03 MED ORDER — OSELTAMIVIR PHOSPHATE 75 MG PO CAPS
75.0000 mg | ORAL_CAPSULE | Freq: Once | ORAL | Status: AC
Start: 2023-09-03 — End: 2023-09-03
  Administered 2023-09-03: 75 mg via ORAL
  Filled 2023-09-03 (×2): qty 1

## 2023-09-03 MED ORDER — OSELTAMIVIR PHOSPHATE 75 MG PO CAPS: 75.0000 mg | ORAL_CAPSULE | Freq: Two times a day (BID) | ORAL | 0 refills | Status: AC

## 2023-09-03 MED ORDER — OSELTAMIVIR PHOSPHATE 30 MG PO CAPS
30.0000 mg | ORAL_CAPSULE | ORAL | Status: DC
  Filled 2023-09-03: qty 1

## 2023-09-03 NOTE — Discharge Instructions (Addendum)
As we discussed, your fever is likely due to the fact that you tested positive for influenza.  We discussed with the on-call oncologist and he agrees that it is appropriate to send you home now.  However, if you develop new or worsening symptoms, please return immediately to the emergency department.  You should continue your regular home medications and we also sent in a prescription for Tamiflu, which is a medicine that may help slightly decrease the duration of your influenza symptoms.  Please follow-up with your regular doctors at the next available opportunity, and remember to return to the ED if you develop new or worsening symptoms.

## 2023-09-03 NOTE — Progress Notes (Signed)
PHARMACY NOTE:  ANTIMICROBIAL RENAL DOSAGE ADJUSTMENT  Current antimicrobial regimen includes a mismatch between antimicrobial dosage and estimated renal function.  As per policy approved by the Pharmacy & Therapeutics and Medical Executive Committees, the antimicrobial dosage will be adjusted accordingly.  Current antimicrobial dosage:  Tamiflu 75 mg PO X 1   Indication: Flu   Renal Function:  Estimated Creatinine Clearance: 57.6 mL/min (by C-G formula based on SCr of 1.01 mg/dL). []      On intermittent HD, scheduled: []      On CRRT    Antimicrobial dosage has been changed to:  Tamiflu 30 mg PO X 1   Additional comments:   Thank you for allowing pharmacy to be a part of this patient's care.  Brylan Seubert D, [facility] 09/03/2023 12:41 AM

## 2023-09-03 NOTE — Progress Notes (Signed)
CODE SEPSIS - PHARMACY COMMUNICATION  **Broad Spectrum Antibiotics should be administered within 1 hour of Sepsis diagnosis**  Time Code Sepsis Called/Page Received: 1/31 @ 2232  Antibiotics Ordered: Cefepime  Time of 1st antibiotic administration: Cefepime 2 gm IV X 1 on 1/31 @ 2306   Additional action taken by pharmacy:   If necessary, Name of Provider/Nurse Contacted:     Emmert Roethler D ,PharmD Clinical Pharmacist  09/03/2023  12:05 AM

## 2023-09-05 ENCOUNTER — Telehealth: Payer: Self-pay | Admitting: *Deleted

## 2023-09-05 ENCOUNTER — Ambulatory Visit: Payer: Medicare Other

## 2023-09-05 ENCOUNTER — Other Ambulatory Visit: Payer: Self-pay | Admitting: Oncology

## 2023-09-05 DIAGNOSIS — C689 Malignant neoplasm of urinary organ, unspecified: Secondary | ICD-10-CM

## 2023-09-05 NOTE — Telephone Encounter (Signed)
Wife called and said that the patient had been in the ER on Friday and was told he has the flu I already talked to radiation and he is not coming today to get his radiation but then the wife called again saying that she needed some him some cough syrup because he is coughing so bad.

## 2023-09-05 NOTE — Telephone Encounter (Signed)
Copied from CRM [id number]. Topic: Clinical - Medical Advice >> Sep 05, 2023 11:40 AM Theodis Sato wrote: Reason for CRM: Patients wife is requesting Dr. Alphonsus Sias to prescribe something for patients cough and does not want to wait for his hospital follow up appointment tomorrow 2/4 at 11 AM - Patients wife disconnected before agent could confirm pharmacy and did not answer attempted call back.

## 2023-09-05 NOTE — Telephone Encounter (Signed)
Wife called to say that her husband was in the ER over the weekend and has the flu , she wanted to know if it is  able to come or not , I called radiation and told them the same thing and Velta Addison is going to call them back.

## 2023-09-06 ENCOUNTER — Ambulatory Visit
Admission: RE | Admit: 2023-09-06 | Discharge: 2023-09-06 | Disposition: A | Discharge status: Home / Self Care | Payer: Medicare Other | Source: Ambulatory Visit | Attending: Radiation Oncology | Admitting: Radiation Oncology

## 2023-09-06 ENCOUNTER — Inpatient Hospital Stay: Payer: Medicare Other | Admitting: Oncology

## 2023-09-06 ENCOUNTER — Encounter: Payer: Self-pay | Admitting: Internal Medicine

## 2023-09-06 ENCOUNTER — Ambulatory Visit: Payer: Medicare Other

## 2023-09-06 ENCOUNTER — Ambulatory Visit (INDEPENDENT_AMBULATORY_CARE_PROVIDER_SITE_OTHER): Payer: Medicare Other | Admitting: Internal Medicine

## 2023-09-06 ENCOUNTER — Other Ambulatory Visit: Payer: Self-pay

## 2023-09-06 ENCOUNTER — Inpatient Hospital Stay: Payer: Medicare Other

## 2023-09-06 ENCOUNTER — Ambulatory Visit
Admission: RE | Admit: 2023-09-06 | Discharge: 2023-09-06 | Discharge status: Home / Self Care | Payer: Medicare Other | Source: Ambulatory Visit | Attending: Radiation Oncology | Admitting: Radiation Oncology

## 2023-09-06 VITALS — BP 104/66 | HR 83 | Temp 98.4°F | Ht 70.0 in | Wt 182.0 lb

## 2023-09-06 DIAGNOSIS — J101 Influenza due to other identified influenza virus with other respiratory manifestations: Secondary | ICD-10-CM | POA: Diagnosis not present

## 2023-09-06 DIAGNOSIS — Z510 Encounter for antineoplastic radiation therapy: Secondary | ICD-10-CM | POA: Diagnosis not present

## 2023-09-06 DIAGNOSIS — C689 Malignant neoplasm of urinary organ, unspecified: Secondary | ICD-10-CM | POA: Diagnosis not present

## 2023-09-06 DIAGNOSIS — C675 Malignant neoplasm of bladder neck: Secondary | ICD-10-CM | POA: Insufficient documentation

## 2023-09-06 DIAGNOSIS — C680 Malignant neoplasm of urethra: Secondary | ICD-10-CM | POA: Insufficient documentation

## 2023-09-06 DIAGNOSIS — Z5111 Encounter for antineoplastic chemotherapy: Secondary | ICD-10-CM | POA: Diagnosis not present

## 2023-09-06 DIAGNOSIS — N320 Bladder-neck obstruction: Secondary | ICD-10-CM

## 2023-09-06 LAB — RAD ONC ARIA SESSION SUMMARY
Course Elapsed Days: 36
Plan Fractions Treated to Date: 25
Plan Prescribed Dose Per Fraction: 1.8 Gy
Plan Total Fractions Prescribed: 25
Plan Total Prescribed Dose: 45 Gy
Reference Point Dosage Given to Date: 45 Gy
Reference Point Session Dosage Given: 1.8 Gy
Session Number: 25

## 2023-09-06 MED ORDER — BENZONATATE 200 MG PO CAPS: 200.0000 mg | ORAL_CAPSULE | Freq: Three times a day (TID) | ORAL | 0 refills | Status: DC | PRN

## 2023-09-06 NOTE — Assessment & Plan Note (Signed)
Voiding okay No recurrence of hematuria

## 2023-09-06 NOTE — Assessment & Plan Note (Signed)
Continues with Rx per Dr Cathie Hoops
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
## 2023-09-06 NOTE — Assessment & Plan Note (Addendum)
Fever 102 and weakness Got dehydrated as well Seems to have had attenuated course though--no pulmonary involvement and better after IV Finishing up the oseltamivir Discussed raising head of bed at night---benzonatate for cough Tylenol prn

## 2023-09-07 ENCOUNTER — Other Ambulatory Visit: Payer: Self-pay

## 2023-09-07 ENCOUNTER — Ambulatory Visit
Admission: RE | Admit: 2023-09-07 | Discharge: 2023-09-07 | Discharge status: Home / Self Care | Payer: Medicare Other | Source: Ambulatory Visit | Attending: Radiation Oncology | Admitting: Radiation Oncology

## 2023-09-07 ENCOUNTER — Ambulatory Visit: Payer: Medicare Other

## 2023-09-07 DIAGNOSIS — C675 Malignant neoplasm of bladder neck: Secondary | ICD-10-CM | POA: Diagnosis not present

## 2023-09-07 DIAGNOSIS — Z5111 Encounter for antineoplastic chemotherapy: Secondary | ICD-10-CM | POA: Diagnosis not present

## 2023-09-07 DIAGNOSIS — C680 Malignant neoplasm of urethra: Secondary | ICD-10-CM | POA: Diagnosis not present

## 2023-09-07 DIAGNOSIS — Z510 Encounter for antineoplastic radiation therapy: Secondary | ICD-10-CM | POA: Diagnosis not present

## 2023-09-07 LAB — RAD ONC ARIA SESSION SUMMARY
Course Elapsed Days: 37
Plan Fractions Treated to Date: 1
Plan Prescribed Dose Per Fraction: 2.5 Gy
Plan Total Fractions Prescribed: 8
Plan Total Prescribed Dose: 20 Gy
Reference Point Dosage Given to Date: 2.5 Gy
Reference Point Session Dosage Given: 2.5 Gy
Session Number: 26

## 2023-09-08 ENCOUNTER — Other Ambulatory Visit: Payer: Self-pay

## 2023-09-08 ENCOUNTER — Ambulatory Visit
Admission: RE | Admit: 2023-09-08 | Discharge: 2023-09-08 | Disposition: A | Discharge status: Home / Self Care | Payer: Medicare Other | Source: Ambulatory Visit | Attending: Radiation Oncology | Admitting: Radiation Oncology

## 2023-09-08 DIAGNOSIS — Z5111 Encounter for antineoplastic chemotherapy: Secondary | ICD-10-CM | POA: Diagnosis not present

## 2023-09-08 DIAGNOSIS — Z510 Encounter for antineoplastic radiation therapy: Secondary | ICD-10-CM | POA: Diagnosis not present

## 2023-09-08 DIAGNOSIS — C675 Malignant neoplasm of bladder neck: Secondary | ICD-10-CM | POA: Diagnosis not present

## 2023-09-08 DIAGNOSIS — C680 Malignant neoplasm of urethra: Secondary | ICD-10-CM | POA: Diagnosis not present

## 2023-09-08 LAB — RAD ONC ARIA SESSION SUMMARY
Course Elapsed Days: 38
Plan Fractions Treated to Date: 2
Plan Prescribed Dose Per Fraction: 2.5 Gy
Plan Total Fractions Prescribed: 8
Plan Total Prescribed Dose: 20 Gy
Reference Point Dosage Given to Date: 5 Gy
Reference Point Session Dosage Given: 2.5 Gy
Session Number: 27

## 2023-09-09 ENCOUNTER — Ambulatory Visit: Payer: Medicare Other

## 2023-09-09 ENCOUNTER — Other Ambulatory Visit: Payer: Self-pay

## 2023-09-09 ENCOUNTER — Ambulatory Visit
Admission: RE | Admit: 2023-09-09 | Discharge: 2023-09-09 | Disposition: A | Discharge status: Home / Self Care | Payer: Medicare Other | Source: Ambulatory Visit | Attending: Radiation Oncology | Admitting: Radiation Oncology

## 2023-09-09 DIAGNOSIS — C680 Malignant neoplasm of urethra: Secondary | ICD-10-CM | POA: Diagnosis not present

## 2023-09-09 DIAGNOSIS — Z510 Encounter for antineoplastic radiation therapy: Secondary | ICD-10-CM | POA: Diagnosis not present

## 2023-09-09 DIAGNOSIS — Z5111 Encounter for antineoplastic chemotherapy: Secondary | ICD-10-CM | POA: Diagnosis not present

## 2023-09-09 DIAGNOSIS — C675 Malignant neoplasm of bladder neck: Secondary | ICD-10-CM | POA: Diagnosis not present

## 2023-09-09 LAB — RAD ONC ARIA SESSION SUMMARY
Course Elapsed Days: 39
Plan Fractions Treated to Date: 3
Plan Prescribed Dose Per Fraction: 2.5 Gy
Plan Total Fractions Prescribed: 8
Plan Total Prescribed Dose: 20 Gy
Reference Point Dosage Given to Date: 7.5 Gy
Reference Point Session Dosage Given: 2.5 Gy
Session Number: 28

## 2023-09-12 ENCOUNTER — Ambulatory Visit
Admission: RE | Admit: 2023-09-12 | Discharge: 2023-09-12 | Disposition: A | Discharge status: Home / Self Care | Payer: Medicare Other | Source: Ambulatory Visit | Attending: Radiation Oncology | Admitting: Radiation Oncology

## 2023-09-12 ENCOUNTER — Other Ambulatory Visit: Payer: Self-pay

## 2023-09-12 DIAGNOSIS — Z5111 Encounter for antineoplastic chemotherapy: Secondary | ICD-10-CM | POA: Diagnosis not present

## 2023-09-12 DIAGNOSIS — C680 Malignant neoplasm of urethra: Secondary | ICD-10-CM | POA: Diagnosis not present

## 2023-09-12 DIAGNOSIS — Z510 Encounter for antineoplastic radiation therapy: Secondary | ICD-10-CM | POA: Diagnosis not present

## 2023-09-12 DIAGNOSIS — C675 Malignant neoplasm of bladder neck: Secondary | ICD-10-CM | POA: Diagnosis not present

## 2023-09-12 LAB — RAD ONC ARIA SESSION SUMMARY
Course Elapsed Days: 42
Plan Fractions Treated to Date: 4
Plan Prescribed Dose Per Fraction: 2.5 Gy
Plan Total Fractions Prescribed: 8
Plan Total Prescribed Dose: 20 Gy
Reference Point Dosage Given to Date: 10 Gy
Reference Point Session Dosage Given: 2.5 Gy
Session Number: 29

## 2023-09-13 ENCOUNTER — Ambulatory Visit
Admission: RE | Admit: 2023-09-13 | Discharge: 2023-09-13 | Disposition: A | Discharge status: Home / Self Care | Payer: Medicare Other | Source: Ambulatory Visit | Attending: Radiation Oncology | Admitting: Radiation Oncology

## 2023-09-13 ENCOUNTER — Ambulatory Visit: Payer: Medicare Other | Admitting: Oncology

## 2023-09-13 ENCOUNTER — Other Ambulatory Visit: Payer: Medicare Other

## 2023-09-13 ENCOUNTER — Other Ambulatory Visit: Payer: Self-pay

## 2023-09-13 ENCOUNTER — Ambulatory Visit: Payer: Medicare Other

## 2023-09-13 DIAGNOSIS — Z510 Encounter for antineoplastic radiation therapy: Secondary | ICD-10-CM | POA: Diagnosis not present

## 2023-09-13 DIAGNOSIS — C675 Malignant neoplasm of bladder neck: Secondary | ICD-10-CM | POA: Diagnosis not present

## 2023-09-13 DIAGNOSIS — C680 Malignant neoplasm of urethra: Secondary | ICD-10-CM | POA: Diagnosis not present

## 2023-09-13 DIAGNOSIS — Z5111 Encounter for antineoplastic chemotherapy: Secondary | ICD-10-CM | POA: Diagnosis not present

## 2023-09-13 LAB — RAD ONC ARIA SESSION SUMMARY
Course Elapsed Days: 43
Plan Fractions Treated to Date: 5
Plan Prescribed Dose Per Fraction: 2.5 Gy
Plan Total Fractions Prescribed: 8
Plan Total Prescribed Dose: 20 Gy
Reference Point Dosage Given to Date: 12.5 Gy
Reference Point Session Dosage Given: 2.5 Gy
Session Number: 30

## 2023-09-14 ENCOUNTER — Other Ambulatory Visit: Payer: Self-pay

## 2023-09-14 ENCOUNTER — Ambulatory Visit
Admission: RE | Admit: 2023-09-14 | Discharge: 2023-09-14 | Disposition: A | Discharge status: Home / Self Care | Payer: Medicare Other | Source: Ambulatory Visit | Attending: Radiation Oncology | Admitting: Radiation Oncology

## 2023-09-14 DIAGNOSIS — C675 Malignant neoplasm of bladder neck: Secondary | ICD-10-CM | POA: Diagnosis not present

## 2023-09-14 DIAGNOSIS — C680 Malignant neoplasm of urethra: Secondary | ICD-10-CM | POA: Diagnosis not present

## 2023-09-14 DIAGNOSIS — Z5111 Encounter for antineoplastic chemotherapy: Secondary | ICD-10-CM | POA: Diagnosis not present

## 2023-09-14 DIAGNOSIS — Z510 Encounter for antineoplastic radiation therapy: Secondary | ICD-10-CM | POA: Diagnosis not present

## 2023-09-14 LAB — RAD ONC ARIA SESSION SUMMARY
Course Elapsed Days: 44
Plan Fractions Treated to Date: 6
Plan Prescribed Dose Per Fraction: 2.5 Gy
Plan Total Fractions Prescribed: 8
Plan Total Prescribed Dose: 20 Gy
Reference Point Dosage Given to Date: 15 Gy
Reference Point Session Dosage Given: 2.5 Gy
Session Number: 31

## 2023-09-15 ENCOUNTER — Other Ambulatory Visit: Payer: Self-pay

## 2023-09-15 ENCOUNTER — Ambulatory Visit
Admission: RE | Admit: 2023-09-15 | Discharge: 2023-09-15 | Disposition: A | Discharge status: Home / Self Care | Payer: Medicare Other | Source: Ambulatory Visit | Attending: Radiation Oncology | Admitting: Radiation Oncology

## 2023-09-15 ENCOUNTER — Other Ambulatory Visit: Payer: Self-pay | Admitting: Internal Medicine

## 2023-09-15 ENCOUNTER — Ambulatory Visit: Payer: Medicare Other

## 2023-09-15 DIAGNOSIS — C675 Malignant neoplasm of bladder neck: Secondary | ICD-10-CM | POA: Diagnosis not present

## 2023-09-15 DIAGNOSIS — Z510 Encounter for antineoplastic radiation therapy: Secondary | ICD-10-CM | POA: Diagnosis not present

## 2023-09-15 DIAGNOSIS — Z5111 Encounter for antineoplastic chemotherapy: Secondary | ICD-10-CM | POA: Diagnosis not present

## 2023-09-15 DIAGNOSIS — C680 Malignant neoplasm of urethra: Secondary | ICD-10-CM | POA: Diagnosis not present

## 2023-09-15 LAB — RAD ONC ARIA SESSION SUMMARY
Course Elapsed Days: 45
Plan Fractions Treated to Date: 7
Plan Prescribed Dose Per Fraction: 2.5 Gy
Plan Total Fractions Prescribed: 8
Plan Total Prescribed Dose: 20 Gy
Reference Point Dosage Given to Date: 17.5 Gy
Reference Point Session Dosage Given: 2.5 Gy
Session Number: 32

## 2023-09-16 ENCOUNTER — Other Ambulatory Visit: Payer: Self-pay | Admitting: Oncology

## 2023-09-16 ENCOUNTER — Inpatient Hospital Stay: Payer: Medicare Other

## 2023-09-16 ENCOUNTER — Other Ambulatory Visit: Payer: Self-pay

## 2023-09-16 ENCOUNTER — Inpatient Hospital Stay: Payer: Medicare Other | Attending: Oncology

## 2023-09-16 ENCOUNTER — Ambulatory Visit
Admission: RE | Admit: 2023-09-16 | Discharge: 2023-09-16 | Disposition: A | Discharge status: Home / Self Care | Payer: Medicare Other | Source: Ambulatory Visit | Attending: Radiation Oncology | Admitting: Radiation Oncology

## 2023-09-16 ENCOUNTER — Encounter: Payer: Self-pay | Admitting: Oncology

## 2023-09-16 ENCOUNTER — Inpatient Hospital Stay: Payer: Medicare Other | Admitting: Oncology

## 2023-09-16 VITALS — BP 122/60 | HR 56

## 2023-09-16 VITALS — BP 137/72 | HR 84 | Temp 97.5°F | Resp 18 | Wt 174.1 lb

## 2023-09-16 DIAGNOSIS — C679 Malignant neoplasm of bladder, unspecified: Secondary | ICD-10-CM | POA: Diagnosis not present

## 2023-09-16 DIAGNOSIS — R634 Abnormal weight loss: Secondary | ICD-10-CM

## 2023-09-16 DIAGNOSIS — C689 Malignant neoplasm of urinary organ, unspecified: Secondary | ICD-10-CM

## 2023-09-16 DIAGNOSIS — Z510 Encounter for antineoplastic radiation therapy: Secondary | ICD-10-CM | POA: Diagnosis not present

## 2023-09-16 DIAGNOSIS — Z5111 Encounter for antineoplastic chemotherapy: Secondary | ICD-10-CM

## 2023-09-16 DIAGNOSIS — C675 Malignant neoplasm of bladder neck: Secondary | ICD-10-CM | POA: Insufficient documentation

## 2023-09-16 DIAGNOSIS — C680 Malignant neoplasm of urethra: Secondary | ICD-10-CM | POA: Diagnosis not present

## 2023-09-16 LAB — CMP (CANCER CENTER ONLY)
ALT: 21 U/L (ref 0–44)
AST: 30 U/L (ref 15–41)
Albumin: 3.5 g/dL (ref 3.5–5.0)
Alkaline Phosphatase: 90 U/L (ref 38–126)
Anion gap: 10 (ref 5–15)
BUN: 23 mg/dL (ref 8–23)
CO2: 24 mmol/L (ref 22–32)
Calcium: 9.2 mg/dL (ref 8.9–10.3)
Chloride: 103 mmol/L (ref 98–111)
Creatinine: 1.01 mg/dL (ref 0.61–1.24)
GFR, Estimated: >60 mL/min (ref >60)
Glucose, Bld: 130 mg/dL — ABNORMAL HIGH (ref 70–99)
Potassium: 4.1 mmol/L (ref 3.5–5.1)
Sodium: 137 mmol/L (ref 135–145)
Total Bilirubin: 0.8 mg/dL (ref 0.0–1.2)
Total Protein: 7.2 g/dL (ref 6.5–8.1)

## 2023-09-16 LAB — RAD ONC ARIA SESSION SUMMARY
Course Elapsed Days: 46
Plan Fractions Treated to Date: 8
Plan Prescribed Dose Per Fraction: 2.5 Gy
Plan Total Fractions Prescribed: 8
Plan Total Prescribed Dose: 20 Gy
Reference Point Dosage Given to Date: 20 Gy
Reference Point Session Dosage Given: 2.5 Gy
Session Number: 33

## 2023-09-16 LAB — CBC WITH DIFFERENTIAL (CANCER CENTER ONLY)
Abs Immature Granulocytes: 0.03 10*3/uL (ref 0.00–0.07)
Basophils Absolute: 0.1 10*3/uL (ref 0.0–0.1)
Basophils Relative: 1 %
Eosinophils Absolute: 0.3 10*3/uL (ref 0.0–0.5)
Eosinophils Relative: 4 %
HCT: 36.3 % — ABNORMAL LOW (ref 39.0–52.0)
Hemoglobin: 11.8 g/dL — ABNORMAL LOW (ref 13.0–17.0)
Immature Granulocytes: 1 %
Lymphocytes Relative: 5 %
Lymphs Abs: 0.3 10*3/uL — ABNORMAL LOW (ref 0.7–4.0)
MCH: 31.1 pg (ref 26.0–34.0)
MCHC: 32.5 g/dL (ref 30.0–36.0)
MCV: 95.5 fL (ref 80.0–100.0)
Monocytes Absolute: 0.8 10*3/uL (ref 0.1–1.0)
Monocytes Relative: 13 %
Neutro Abs: 4.8 10*3/uL (ref 1.7–7.7)
Neutrophils Relative %: 76 %
Platelet Count: 324 10*3/uL (ref 150–400)
RBC: 3.8 MIL/uL — ABNORMAL LOW (ref 4.22–5.81)
RDW: 19.7 % — ABNORMAL HIGH (ref 11.5–15.5)
WBC Count: 6.3 10*3/uL (ref 4.0–10.5)
nRBC: 0 % (ref 0.0–0.2)

## 2023-09-16 MED ORDER — SODIUM CHLORIDE 0.9 % IV SOLN
27.0000 mg/m2 | Freq: Once | INTRAVENOUS | Status: AC
  Administered 2023-09-16: 53.2 mg via INTRAVENOUS
  Filled 2023-09-16: qty 1.4

## 2023-09-16 MED ORDER — MEGESTROL ACETATE 40 MG PO TABS: 80.0000 mg | ORAL_TABLET | Freq: Two times a day (BID) | ORAL | 0 refills | Status: DC

## 2023-09-16 MED ORDER — PROCHLORPERAZINE MALEATE 10 MG PO TABS
10.0000 mg | ORAL_TABLET | Freq: Once | ORAL | Status: AC
Start: 2023-09-16 — End: 2023-09-16
  Administered 2023-09-16: 10 mg via ORAL
  Filled 2023-09-16: qty 1

## 2023-09-16 MED ORDER — SODIUM CHLORIDE 0.9 % IV SOLN
Freq: Once | INTRAVENOUS | Status: AC
Start: 2023-09-16 — End: 2023-09-16
  Filled 2023-09-16: qty 250

## 2023-09-16 NOTE — Assessment & Plan Note (Signed)
Recommend patient to continue nutrition supplementation and follow up with nutritionist.
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
## 2023-09-16 NOTE — Assessment & Plan Note (Signed)
Chemotherapy treatment as planned above

## 2023-09-16 NOTE — Patient Instructions (Signed)
[facility] - A DEPT OF [facility]  Discharge Instructions: Thank you for choosing [facility] to provide your oncology and hematology care.  If you have a lab appointment with the Cancer Center, please go directly to the Cancer Center and check in at the registration area.  Wear comfortable clothing and clothing appropriate for easy access to any Portacath or PICC line.   We strive to give you quality time with your provider. You may need to reschedule your appointment if you arrive late (15 or more minutes).  Arriving late affects you and other patients whose appointments are after yours.  Also, if you miss three or more appointments without notifying the office, you may be dismissed from the clinic at the provider's discretion.      For prescription refill requests, have your pharmacy contact our office and allow 72 hours for refills to be completed.    Today you received the following chemotherapy and/or immunotherapy agents Gemzar      To help prevent nausea and vomiting after your treatment, we encourage you to take your nausea medication as directed.  BELOW ARE SYMPTOMS THAT SHOULD BE REPORTED IMMEDIATELY: *FEVER GREATER THAN 100.4 F (38 C) OR HIGHER *CHILLS OR SWEATING *NAUSEA AND VOMITING THAT IS NOT CONTROLLED WITH YOUR NAUSEA MEDICATION *UNUSUAL SHORTNESS OF BREATH *UNUSUAL BRUISING OR BLEEDING *URINARY PROBLEMS (pain or burning when urinating, or frequent urination) *BOWEL PROBLEMS (unusual diarrhea, constipation, pain near the anus) TENDERNESS IN MOUTH AND THROAT WITH OR WITHOUT PRESENCE OF ULCERS (sore throat, sores in mouth, or a toothache) UNUSUAL RASH, SWELLING OR PAIN  UNUSUAL VAGINAL DISCHARGE OR ITCHING   Items with * indicate a potential emergency and should be followed up as soon as possible or go to the Emergency Department if any problems should occur.  Please show the CHEMOTHERAPY ALERT CARD or IMMUNOTHERAPY ALERT  CARD at check-in to the Emergency Department and triage nurse.  Should you have questions after your visit or need to cancel or reschedule your appointment, please contact [facility] - A DEPT OF [facility]  [phone] and follow the prompts.  Office hours are 8:00 a.m. to 4:30 p.m. Monday - Friday. Please note that voicemails left after 4:00 p.m. may not be returned until the following business day.  We are closed weekends and major holidays. You have access to a nurse at all times for urgent questions. Please call the main number to the clinic [phone] and follow the prompts.  For any non-urgent questions, you may also contact your provider using MyChart. We now offer e-Visits for anyone 60 and older to request care online for non-urgent symptoms. For details visit [email or web address].   Also download the MyChart app! Go to the app store, search "MyChart", open the app, select [place], and log in with your MyChart username and password.

## 2023-09-16 NOTE — Assessment & Plan Note (Addendum)
Urothelial carcinoma of prostatic urethra/bladder neck, cT2, muscle invasive.  He is not a candidate for radical cystectomy.  Currently on bi weekly Gemcitabine 27mg /m2 concurrently with radiation.  Labs are reviewed and discussed with patient. Proceed with last dose of gemcitabine today.  He is finishing radiation today. Repeat CT scan in 3 months.  Continue follow-up with urology

## 2023-09-17 ENCOUNTER — Other Ambulatory Visit: Payer: Self-pay

## 2023-09-19 ENCOUNTER — Ambulatory Visit: Payer: Self-pay | Admitting: Internal Medicine

## 2023-09-19 ENCOUNTER — Other Ambulatory Visit: Payer: Self-pay | Admitting: Oncology

## 2023-09-19 ENCOUNTER — Encounter: Payer: Self-pay | Admitting: Internal Medicine

## 2023-09-19 ENCOUNTER — Ambulatory Visit (INDEPENDENT_AMBULATORY_CARE_PROVIDER_SITE_OTHER): Payer: Medicare Other | Admitting: Internal Medicine

## 2023-09-19 VITALS — BP 104/82 | HR 90 | Temp 98.3°F | Ht 70.0 in | Wt 175.0 lb

## 2023-09-19 DIAGNOSIS — R310 Gross hematuria: Secondary | ICD-10-CM | POA: Diagnosis not present

## 2023-09-19 LAB — POC URINALSYSI DIPSTICK (AUTOMATED)
Bilirubin, UA: NEGATIVE
Glucose, UA: NEGATIVE
Nitrite, UA: NEGATIVE
Protein, UA: POSITIVE — AB
Spec Grav, UA: 1.02 (ref 1.010–1.025)
Urobilinogen, UA: 0.2 U/dL
pH, UA: 5.5 (ref 5.0–8.0)

## 2023-09-19 NOTE — Addendum Note (Signed)
 Addended by: Eual Fines on: 09/19/2023 02:01 PM   Modules accepted: Orders

## 2023-09-19 NOTE — Telephone Encounter (Signed)
 Spoke to pt's wife per DPR. She would like for him to be seen today because he has no appetite and is still in bed. I will have him put on at 145 today and cancel tomorrow.

## 2023-09-19 NOTE — Radiation Completion Notes (Signed)
 Patient Name: [name], [name] MRN: [id number] Date of Birth: [birth year] Referring Physician: Tillman Abide, M.D. Date of Service: 2023-09-19 Radiation Oncologist: Carmina Miller, M.D. [facility]                             RADIATION ONCOLOGY END OF TREATMENT NOTE     Diagnosis: C68.0 Malignant neoplasm of urethra Intent: Curative     HPI: Patient is an 89 year old male originally consulted back in October when he presented with hematuria.  Workup showed a nonobstructing punctate lower left renal stone with no hydronephrosis.  There was bladder wall thickening and trabeculation cystoscopy showed shaggy abnormal appearing tissue in the prostatic urethra and the bladder neck which was resected showing high-grade urethral carcinoma with muscle invasion.  We recommended concurrent chemoradiation therapy.  He will receive gemcitabine chemotherapy concurrently under medical oncology's direction.  He has recently undergone.HoLEP (Holmium Laser Enucleation of the Prostate) which he tolerated well.  He states his urinary flow is satisfactory at the present time.  Patient had a CT scan back in early November again showing mild wall thickening with enhancement along the left posterior bladder.      ==========DELIVERED PLANS==========  First Treatment Date: 2023-08-01 Last Treatment Date: 2023-09-16   Plan Name: Bladder Site: Bladder Technique: 3D Mode: Photon Dose Per Fraction: 1.8 Gy Prescribed Dose (Delivered / Prescribed): 45 Gy / 45 Gy Prescribed Fxs (Delivered / Prescribed): 25 / 25   Plan Name: Bladder_Bst Site: Bladder Technique: 3D Mode: Photon Dose Per Fraction: 2.5 Gy Prescribed Dose (Delivered / Prescribed): 20 Gy / 20 Gy Prescribed Fxs (Delivered / Prescribed): 8 / 8     ==========ON TREATMENT VISIT DATES========== 2023-08-02, 2023-08-09, 2023-08-16, 2023-08-24, 2023-08-30, 2023-09-06, 2023-09-13, 2023-09-15     ==========UPCOMING  VISITS==========       ==========APPENDIX - ON TREATMENT VISIT NOTES==========   See weekly On Treatment Notes in Epic for details in the Media tab (listed as Progress notes on the On Treatment Visit Dates listed above).

## 2023-09-19 NOTE — Assessment & Plan Note (Signed)
 Has had bleeding intermittently since cancer excised--he has sense that it just never healed Does have pain when the stream ends but not during Just finished the RT Urine is brownish--with 2+ leuks and 3+ blood (but negative nitrite)  Blood is likely remnants from the surgery--doubt the RT would cause it this soon Probably no infection--past cultures negative Will hold off on Rx unless culture is positive Send note to Drs Richardo Hanks and Cathie Hoops

## 2023-09-19 NOTE — Telephone Encounter (Signed)
 Copied from CRM [id number]. Topic: Clinical - Red Word Triage >> Sep 19, 2023  9:46 AM Lennart Pall wrote: Red Word that prompted transfer to Nurse Triage: Patient just finished radiation and chemo on 2/14- Blood in urine since saturday- no pain when urinating.  Chief Complaint: bloody urine Symptoms: blood in urine, burning w/urination, increase frequency Frequency: Saturday Pertinent Negatives: Patient denies fever Disposition: [] ED /[] Urgent Care (no appt availability in office) / [x] Appointment(In office/virtual)/ []  [facility]/ [] Home Care/ [] Refused Recommended Disposition /[]  Mobile Bus/ []  Follow-up with PCP Additional Notes:  Patient just finished radiation and chemo on 2/14  Reason for Disposition  Pain or burning with passing urine  Answer Assessment - Initial Assessment Questions 1. COLOR of URINE: "Describe the color of the urine."  (e.g., tea-colored, pink, red, bloody) "Do you have blood clots in your urine?" (e.g., none, pea, grape, small coin)     Light red,  2. ONSET: "When did the bleeding start?"      Saturday 3. EPISODES: "How many times has there been blood in the urine?" or "How many times today?"     unknown 4. PAIN with URINATION: "Is there any pain with passing your urine?" If Yes, ask: "How bad is the pain?"  (Scale 1-10; or mild, moderate, severe)    - MILD: Complains slightly about urination hurting.    - MODERATE: Interferes with normal activities.      - SEVERE: Excruciating, unwilling or unable to urinate because of the pain.      Burning with urination 5. FEVER: "Do you have a fever?" If Yes, ask: "What is your temperature, how was it measured, and when did it start?"     no 6. ASSOCIATED SYMPTOMS: "Are you passing urine more frequently than usual?"     Increase in urination, burning with urination, blood in urine 7. OTHER SYMPTOMS: "Do you have any other symptoms?" (e.g., back/flank pain, abdomen pain, vomiting)     no 8.  PREGNANCY: "Is there any chance you are pregnant?" "When was your last menstrual period?"     N/a  Protocols used: Urine - Blood In-A-AH
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
## 2023-09-20 ENCOUNTER — Inpatient Hospital Stay: Payer: Medicare Other

## 2023-09-20 ENCOUNTER — Ambulatory Visit: Payer: Medicare Other

## 2023-09-20 ENCOUNTER — Ambulatory Visit: Payer: Medicare Other | Admitting: Internal Medicine

## 2023-09-20 ENCOUNTER — Ambulatory Visit: Payer: Medicare Other | Admitting: Oncology

## 2023-09-20 LAB — URINE CULTURE
MICRO NUMBER:: 16092669
SPECIMEN QUALITY:: ADEQUATE

## 2023-09-21 ENCOUNTER — Encounter: Payer: Self-pay | Admitting: Internal Medicine

## 2023-10-05 ENCOUNTER — Other Ambulatory Visit: Payer: Self-pay

## 2023-10-05 DIAGNOSIS — R339 Retention of urine, unspecified: Secondary | ICD-10-CM

## 2023-10-05 DIAGNOSIS — N138 Other obstructive and reflux uropathy: Secondary | ICD-10-CM

## 2023-10-05 DIAGNOSIS — R399 Unspecified symptoms and signs involving the genitourinary system: Secondary | ICD-10-CM

## 2023-10-06 ENCOUNTER — Ambulatory Visit: Payer: Medicare Other | Admitting: Urology

## 2023-10-06 ENCOUNTER — Encounter: Payer: Self-pay | Admitting: Radiation Oncology

## 2023-10-06 ENCOUNTER — Ambulatory Visit
Admission: RE | Admit: 2023-10-06 | Discharge: 2023-10-06 | Disposition: A | Discharge status: Home / Self Care | Payer: Medicare Other | Source: Ambulatory Visit | Attending: Radiation Oncology | Admitting: Radiation Oncology

## 2023-10-06 VITALS — BP 112/62 | HR 80

## 2023-10-06 VITALS — BP 130/73 | HR 60 | Temp 98.0°F | Resp 12 | Wt 174.0 lb

## 2023-10-06 DIAGNOSIS — N401 Enlarged prostate with lower urinary tract symptoms: Secondary | ICD-10-CM

## 2023-10-06 DIAGNOSIS — N138 Other obstructive and reflux uropathy: Secondary | ICD-10-CM | POA: Diagnosis not present

## 2023-10-06 DIAGNOSIS — C679 Malignant neoplasm of bladder, unspecified: Secondary | ICD-10-CM

## 2023-10-06 LAB — BLADDER SCAN AMB NON-IMAGING: Scan Result: 19

## 2023-10-06 MED ORDER — GEMTESA 75 MG PO TABS
75.0000 mg | ORAL_TABLET | Freq: Every day | ORAL | Status: DC
Start: 2023-10-06 — End: 2023-10-31

## 2023-10-06 NOTE — Progress Notes (Signed)
 Radiation Oncology Follow up Note  Name: [name]   Date:   10/06/2023 MRN:  [id number] DOB: 06/21/1935    This 89 y.o. 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  He is seen today in follow-up continues to have some slight hematuria.  He is currently on Eliquis which may be contributing to some of that his hemoglobin and hematocrit have remained stable over time.  He is also being managed by urology and possible cystoscopy in the future should his bleeding persist.  He is recently put on Gemtesa by urology.  COMPLICATIONS OF TREATMENT: none  FOLLOW UP COMPLIANCE: keeps appointments   PHYSICAL EXAM:  BP 130/73   Pulse 60   Temp 98 F (36.7 C) (Tympanic)   Resp 12   Wt 174 lb (78.9 kg)   BMI 24.97 kg/m  Well-developed well-nourished patient in NAD. HEENT reveals PERLA, EOMI, discs not visualized.  Oral cavity is clear. No oral mucosal lesions are identified. Neck is clear without evidence of cervical or supraclavicular adenopathy. Lungs are clear to A&P. Cardiac examination is essentially unremarkable with regular rate and rhythm without murmur rub or thrill. Abdomen is benign with no organomegaly or masses noted. Motor sensory and DTR levels are equal and symmetric in the upper and lower extremities. Cranial nerves II through XII are grossly intact. Proprioception is intact. No peripheral adenopathy or edema is identified. No motor or sensory levels are noted. Crude visual fields are within normal range.  RADIOLOGY RESULTS: No current films to review  PLAN: Present time patient is stable.  Continues to have  some hematuria although his hematocrit is stable.  He is also on Eliquis which probably contributes to that.  He will continue follow-up care with urology.  I have asked to see him back in 4 months for follow-up.  Patient and wife know to call with any concerns at any time.  I would like to take this opportunity to thank you for allowing me to participate in the care of your patient.Carmina Miller, MD

## 2023-10-06 NOTE — Patient Instructions (Signed)

## 2023-10-06 NOTE — Progress Notes (Signed)
   10/06/2023 12:31 PM   [name] May 21, 1935 [id number]  Reason for visit: Follow up muscle invasive bladder cancer with obstruction status post HOLEP  HPI: 89 year old male who been followed by Dr. Lonna Cobb from gross hematuria and was ultimately found to have high-grade urothelial cell carcinoma invading muscle in the prostatic fossa on biopsy with Dr. Lonna Cobb in September 2024.  He also had significant obstructive symptoms with urgency, frequency, weak stream, incomplete emptying, and was referred to me to consider HOLEP prior to being treated with Tri modal therapy chemo/radiation.  He underwent HOLEP on 06/10/2023 with removal of 32 g of tissue showing high-grade muscle invasive urothelial cell carcinoma in addition to BPH.  There was possible residual tumor at the left bladder neck.  Recently completed chemotherapy and radiation, has some mild hematuria that has improved.  He remains on Eliquis.  He is urinating with a strong stream, has some persistent urgency and frequency without significant leakage, and nocturia 2-3 times at night.  PVR today is normal at 19ml.  We reviewed his urinary symptoms are likely multifactorial in the setting of known urothelial cell cancer at the bladder neck and prostate, recent HOLEP, recent radiation, and likely a component of long-term obstruction causing bladder damage.  He also consumes a fair amount of caffeine and soda during the day and I recommended drinking primarily water and minimizing fluids prior to bedtime.  Urine culture 2/17 showed no growth.  I recommended a trial of Gemtesa today, and behavioral strategies discussed extensively  Next staging imaging with oncology scheduled for 12/14/2023 Trial of Gemtesa, samples given RTC 4 weeks PVR  I spent 45 total minutes on the day of the encounter including pre-visit review of the medical record, face-to-face time with the patient, and post visit ordering of labs/imaging/tests.  Extensive review  of oncology and radiation oncology notes and prior urologic history.   Sondra Come, MD  [facility] [address], [address] [place], [place] [address] [phone]

## 2023-10-17 ENCOUNTER — Other Ambulatory Visit: Payer: Self-pay | Admitting: Internal Medicine

## 2023-10-31 ENCOUNTER — Ambulatory Visit (INDEPENDENT_AMBULATORY_CARE_PROVIDER_SITE_OTHER): Admitting: Urology

## 2023-10-31 ENCOUNTER — Encounter: Payer: Self-pay | Admitting: Urology

## 2023-10-31 VITALS — BP 136/66 | HR 60 | Ht 70.0 in | Wt 175.0 lb

## 2023-10-31 DIAGNOSIS — C679 Malignant neoplasm of bladder, unspecified: Secondary | ICD-10-CM

## 2023-10-31 DIAGNOSIS — N401 Enlarged prostate with lower urinary tract symptoms: Secondary | ICD-10-CM | POA: Diagnosis not present

## 2023-10-31 DIAGNOSIS — N138 Other obstructive and reflux uropathy: Secondary | ICD-10-CM

## 2023-10-31 LAB — BLADDER SCAN AMB NON-IMAGING: PVR: 0 WU

## 2023-10-31 MED ORDER — GEMTESA 75 MG PO TABS: 75.0000 mg | ORAL_TABLET | Freq: Every day | ORAL | 11 refills | Status: DC

## 2023-10-31 NOTE — Progress Notes (Signed)
   10/31/2023 2:29 PM   [name] [birth year] [id number]  Reason for visit: Follow up muscle invasive bladder cancer with obstruction status post HOLEP  HPI: 89 year old male followed by Dr. Lonna Cobb for gross hematuria and was ultimately found to have high-grade urothelial cell carcinoma invading muscle in the prostatic fossa on biopsy with Dr. Lonna Cobb in September 2024.  He also had significant obstructive symptoms with urgency, frequency, weak stream, incomplete emptying, and was referred to me to consider HOLEP prior to being treated with Tri modal therapy chemo/radiation.  He underwent HOLEP on 06/10/2023 with removal of 32 g of tissue showing high-grade muscle invasive urothelial cell carcinoma in addition to BPH.  There was possible residual tumor at the left bladder neck.  Recently completed chemotherapy and radiation, has some mild hematuria that has improved.  He remains on Eliquis.  He is urinating with a strong stream,, PVR today normal at 0ml.  At our last visit he was having some persistent urgency, frequency, and urge incontinence and he was given samples of Gemtesa.  Symptoms have essentially resolved on that medication he is not having any incontinence at this point.  Nocturia only 2 times overnight.  He has also cut back on bladder irritants.  Urine cultures have been negative.  Gemtesa refilled, if not affordable could consider trospium Next staging imaging with oncology scheduled for 12/14/2023 Follow-up with Dr. Lonna Cobb May 2025 for surveillance cystoscopy after trimodal therapy for muscle invasive urothelial cell carcinoma of the prostatic fossa/bladder neck   Sondra Come, MD  [facility] [address], [address] [place], [place] [address] [phone]

## 2023-11-07 ENCOUNTER — Telehealth: Payer: Self-pay

## 2023-11-07 NOTE — Telephone Encounter (Signed)
 Pt LM on triage line stating that they were given an expired coupon for [facility]. Unclear who gave them the coupon as coupons do not typically work with medicare. Advised pt and wife that we would pull an up to date coupon from the [employer] website and have it up front for pick up, the wife would like to proceed with this plan. Advised wife on low likelihood of coupon working and that we could send in alternative per Dr. Keane Scrape note she would still like to pick up coupon.

## 2023-11-08 NOTE — Telephone Encounter (Signed)
 Coupon placed up front and additional information I found on the website, but it looks like if they have medicare coupon is not eligible

## 2023-11-16 ENCOUNTER — Ambulatory Visit: Payer: Medicare Other | Admitting: Internal Medicine

## 2023-11-16 ENCOUNTER — Encounter: Payer: Self-pay | Admitting: Internal Medicine

## 2023-11-16 VITALS — BP 110/60 | HR 73 | Temp 98.4°F | Ht 70.5 in | Wt 174.0 lb

## 2023-11-16 DIAGNOSIS — I2510 Atherosclerotic heart disease of native coronary artery without angina pectoris: Secondary | ICD-10-CM | POA: Diagnosis not present

## 2023-11-16 DIAGNOSIS — E441 Mild protein-calorie malnutrition: Secondary | ICD-10-CM | POA: Diagnosis not present

## 2023-11-16 DIAGNOSIS — I82402 Acute embolism and thrombosis of unspecified deep veins of left lower extremity: Secondary | ICD-10-CM

## 2023-11-16 DIAGNOSIS — Z0000 Encounter for general adult medical examination without abnormal findings: Secondary | ICD-10-CM | POA: Diagnosis not present

## 2023-11-16 DIAGNOSIS — M353 Polymyalgia rheumatica: Secondary | ICD-10-CM

## 2023-11-16 DIAGNOSIS — I5022 Chronic systolic (congestive) heart failure: Secondary | ICD-10-CM | POA: Diagnosis not present

## 2023-11-16 NOTE — Assessment & Plan Note (Signed)
 In setting of malignancy Will be seeing Dr Wilhelmenia Harada next month---will leave the decision to stop eliquis up to her (if no bladder cancer recurrence)
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
## 2023-11-16 NOTE — Assessment & Plan Note (Signed)
 Appetite still poor despite the megace 80 bid Weight has stabilized though

## 2023-11-16 NOTE — Assessment & Plan Note (Signed)
 No angina Atorvastatin 40mg  and losartan

## 2023-11-16 NOTE — Assessment & Plan Note (Signed)
 Compensated on losartan 25mg  daily

## 2023-11-16 NOTE — Assessment & Plan Note (Signed)
 No symptomatic recurrence off the prednisone

## 2023-11-16 NOTE — Assessment & Plan Note (Signed)
 I have personally reviewed the Medicare Annual Wellness questionnaire and have noted 1. The patient's medical and social history 2. Their use of alcohol, tobacco or illicit drugs 3. Their current medications and supplements 4. The patient's functional ability including ADL's, fall risks, home safety risks and hearing or visual             impairment. 5. Diet and physical activities 6. Evidence for depression or mood disorders  The patients weight, height, BMI and visual acuity have been recorded in the chart I have made referrals, counseling and provided education to the patient based review of the above and I have provided the pt with a written personalized care plan for preventive services.  I have provided you with a copy of your personalized plan for preventive services. Please take the time to review along with your updated medication list.  Discussed increasing exercise No cancer screening due to age Thinks he had RSV Flu vaccine in the fall (prefers no more COVID vaccines)

## 2023-11-16 NOTE — Progress Notes (Signed)
 Vision Screening   Right eye Left eye Both eyes  Without correction 20/30 20/30 20/20   With correction     Hearing Screening - Comments:: Has hearing aids. Not wearing them today

## 2023-12-14 ENCOUNTER — Ambulatory Visit
Admission: RE | Admit: 2023-12-14 | Discharge: 2023-12-14 | Disposition: A | Discharge status: Home / Self Care | Payer: Medicare Other | Source: Ambulatory Visit | Attending: Oncology | Admitting: Oncology

## 2023-12-14 DIAGNOSIS — N289 Disorder of kidney and ureter, unspecified: Secondary | ICD-10-CM | POA: Diagnosis not present

## 2023-12-14 DIAGNOSIS — C679 Malignant neoplasm of bladder, unspecified: Secondary | ICD-10-CM | POA: Diagnosis not present

## 2023-12-14 DIAGNOSIS — N281 Cyst of kidney, acquired: Secondary | ICD-10-CM | POA: Diagnosis not present

## 2023-12-14 DIAGNOSIS — I700 Atherosclerosis of aorta: Secondary | ICD-10-CM | POA: Diagnosis not present

## 2023-12-14 LAB — POCT I-STAT CREATININE: Creatinine, Ser: 0.7 mg/dL (ref 0.61–1.24)

## 2023-12-14 MED ORDER — IOHEXOL 300 MG/ML  SOLN
100.0000 mL | Freq: Once | INTRAMUSCULAR | Status: AC | PRN
  Administered 2023-12-14: 100 mL via INTRAVENOUS

## 2023-12-23 ENCOUNTER — Inpatient Hospital Stay (HOSPITAL_BASED_OUTPATIENT_CLINIC_OR_DEPARTMENT_OTHER): Payer: Medicare Other | Admitting: Oncology

## 2023-12-23 ENCOUNTER — Encounter: Payer: Self-pay | Admitting: Oncology

## 2023-12-23 ENCOUNTER — Inpatient Hospital Stay: Payer: Medicare Other | Attending: Oncology

## 2023-12-23 VITALS — BP 117/71 | HR 100 | Temp 97.0°F | Resp 16 | Wt 174.0 lb

## 2023-12-23 DIAGNOSIS — Z923 Personal history of irradiation: Secondary | ICD-10-CM | POA: Diagnosis not present

## 2023-12-23 DIAGNOSIS — Z9221 Personal history of antineoplastic chemotherapy: Secondary | ICD-10-CM | POA: Diagnosis not present

## 2023-12-23 DIAGNOSIS — E441 Mild protein-calorie malnutrition: Secondary | ICD-10-CM | POA: Diagnosis not present

## 2023-12-23 DIAGNOSIS — C689 Malignant neoplasm of urinary organ, unspecified: Secondary | ICD-10-CM

## 2023-12-23 DIAGNOSIS — C675 Malignant neoplasm of bladder neck: Secondary | ICD-10-CM | POA: Insufficient documentation

## 2023-12-23 LAB — CBC WITH DIFFERENTIAL (CANCER CENTER ONLY)
Abs Immature Granulocytes: 0.03 10*3/uL (ref 0.00–0.07)
Basophils Absolute: 0 10*3/uL (ref 0.0–0.1)
Basophils Relative: 0 %
Eosinophils Absolute: 0.2 10*3/uL (ref 0.0–0.5)
Eosinophils Relative: 2 %
HCT: 41.6 % (ref 39.0–52.0)
Hemoglobin: 13.4 g/dL (ref 13.0–17.0)
Immature Granulocytes: 0 %
Lymphocytes Relative: 5 %
Lymphs Abs: 0.4 10*3/uL — ABNORMAL LOW (ref 0.7–4.0)
MCH: 30.5 pg (ref 26.0–34.0)
MCHC: 32.2 g/dL (ref 30.0–36.0)
MCV: 94.8 fL (ref 80.0–100.0)
Monocytes Absolute: 0.6 10*3/uL (ref 0.1–1.0)
Monocytes Relative: 8 %
Neutro Abs: 6.3 10*3/uL (ref 1.7–7.7)
Neutrophils Relative %: 85 %
Platelet Count: 186 10*3/uL (ref 150–400)
RBC: 4.39 MIL/uL (ref 4.22–5.81)
RDW: 13 % (ref 11.5–15.5)
WBC Count: 7.6 10*3/uL (ref 4.0–10.5)
nRBC: 0 % (ref 0.0–0.2)

## 2023-12-23 LAB — CMP (CANCER CENTER ONLY)
ALT: 14 U/L (ref 0–44)
AST: 25 U/L (ref 15–41)
Albumin: 3.5 g/dL (ref 3.5–5.0)
Alkaline Phosphatase: 109 U/L (ref 38–126)
Anion gap: 8 (ref 5–15)
BUN: 19 mg/dL (ref 8–23)
CO2: 22 mmol/L (ref 22–32)
Calcium: 9.2 mg/dL (ref 8.9–10.3)
Chloride: 110 mmol/L (ref 98–111)
Creatinine: 0.84 mg/dL (ref 0.61–1.24)
GFR, Estimated: >60 mL/min (ref >60)
Glucose, Bld: 100 mg/dL — ABNORMAL HIGH (ref 70–99)
Potassium: 4.1 mmol/L (ref 3.5–5.1)
Sodium: 140 mmol/L (ref 135–145)
Total Bilirubin: 0.6 mg/dL (ref 0.0–1.2)
Total Protein: 6.8 g/dL (ref 6.5–8.1)

## 2023-12-23 NOTE — Assessment & Plan Note (Addendum)
 Urothelial carcinoma of prostatic urethra/bladder neck, cT2, muscle invasive.  He is not a candidate for radical cystectomy.  S/p bi weekly Gemcitabine  27mg /m2 concurrently with radiation.  Labs are reviewed and discussed with patient. CT scan showed no metastatic disease. Bladder with Post treatment changes Recommend patient to make follow-up with urology for repeat cystoscopy

## 2023-12-23 NOTE — Progress Notes (Signed)
 Patient had a CT scan on 12/14/2023. He is doing well, no new questions or concerns for the doctor today.
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
## 2023-12-24 NOTE — Assessment & Plan Note (Signed)
 Improved. Off Megace . discontinue

## 2023-12-28 ENCOUNTER — Encounter: Payer: Self-pay | Admitting: *Deleted

## 2023-12-29 ENCOUNTER — Ambulatory Visit: Admitting: Urology

## 2023-12-29 VITALS — BP 101/56 | HR 86 | Ht 72.0 in | Wt 175.0 lb

## 2023-12-29 DIAGNOSIS — N401 Enlarged prostate with lower urinary tract symptoms: Secondary | ICD-10-CM

## 2023-12-29 DIAGNOSIS — R3129 Other microscopic hematuria: Secondary | ICD-10-CM | POA: Diagnosis not present

## 2023-12-29 DIAGNOSIS — N138 Other obstructive and reflux uropathy: Secondary | ICD-10-CM

## 2023-12-29 LAB — URINALYSIS, COMPLETE
Bilirubin, UA: NEGATIVE
Glucose, UA: NEGATIVE
Ketones, UA: NEGATIVE
Leukocytes,UA: NEGATIVE
Nitrite, UA: NEGATIVE
Specific Gravity, UA: 1.03 (ref 1.005–1.030)
Urobilinogen, Ur: 0.2 mg/dL (ref 0.2–1.0)
pH, UA: 6 (ref 5.0–7.5)

## 2023-12-29 LAB — MICROSCOPIC EXAMINATION

## 2023-12-29 NOTE — Progress Notes (Signed)
   12/29/23  CC:  Chief Complaint  Patient presents with   Cysto    HPI: Muscle invasive high-grade urothelial carcinoma invading prostatic fossa.  Not deemed a surgical candidate and treated with chemo/radiation therapy.  Due to significant prostatic voiding symptoms HoLEP performed by Dr. Estanislao Heimlich 06/10/2023.  He has completed chemoradiation and presents today for surveillance cystoscopy.  He has no complaints.  UA with 11-30 RBC.  Blood pressure (!) 101/56, pulse 86, height 6' (1.829 m), weight 175 lb (79.4 kg). NED. A&Ox3.   No respiratory distress   Abd soft, NT, ND Normal phallus with bilateral descended testicles  Cystoscopy Procedure Note  Patient identification was confirmed, informed consent was obtained, and patient was prepped using Betadine solution.  Lidocaine  jelly was administered per urethral meatus.     Pre-Procedure: - Inspection reveals a normal caliber ureteral meatus.  Procedure: The flexible cystoscope was introduced without difficulty - No urethral strictures/lesions are present. - Open prostatic fossa without tumor - Open bladder neck; mild inflammatory changes bladder neck - Bilateral ureteral orifices identified - Bladder mucosa  reveals no ulcers, tumors, or lesions - No bladder stones - Moderate trabeculation  Retroflexion shows no tumor   Post-Procedure: - Patient tolerated the procedure well  Assessment/ Plan: No evidence recurrent tumor Urine cytology sent Follow-up surveillance cystoscopy 3 months   Geraline Knapp, MD

## 2024-01-09 ENCOUNTER — Telehealth: Payer: Self-pay | Admitting: Urology

## 2024-01-09 NOTE — Telephone Encounter (Signed)
Urine cytology showed no abnormal cells.  Follow-up as scheduled 

## 2024-01-26 ENCOUNTER — Telehealth: Payer: Self-pay

## 2024-01-26 NOTE — Telephone Encounter (Signed)
 Pt's wife called the triage line stating pt has been having blood in his urine, passing clots (fingertip size) since Tuesday. Pt states he has been having urinary frequency and does not feel like he is emptying completely. Pt denies fever or chills. Scheduled for tomorrow with Sam

## 2024-01-27 ENCOUNTER — Ambulatory Visit (INDEPENDENT_AMBULATORY_CARE_PROVIDER_SITE_OTHER): Admitting: Physician Assistant

## 2024-01-27 VITALS — BP 101/59 | HR 91 | Ht 72.0 in | Wt 172.0 lb

## 2024-01-27 DIAGNOSIS — R310 Gross hematuria: Secondary | ICD-10-CM

## 2024-01-27 LAB — URINALYSIS, COMPLETE
Bilirubin, UA: NEGATIVE
Glucose, UA: NEGATIVE
Nitrite, UA: NEGATIVE
Specific Gravity, UA: 1.025 (ref 1.005–1.030)
Urobilinogen, Ur: 4 mg/dL — ABNORMAL HIGH (ref 0.2–1.0)
pH, UA: 6 (ref 5.0–7.5)

## 2024-01-27 LAB — MICROSCOPIC EXAMINATION
RBC, Urine: >30 /HPF — AB (ref 0–2)
WBC, UA: >30 /HPF — AB (ref 0–5)

## 2024-01-27 LAB — BLADDER SCAN AMB NON-IMAGING

## 2024-01-27 MED ORDER — DOXYCYCLINE HYCLATE 100 MG PO CAPS: 100.0000 mg | ORAL_CAPSULE | Freq: Two times a day (BID) | ORAL | 0 refills | Status: AC
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
## 2024-02-01 LAB — CULTURE, URINE COMPREHENSIVE

## 2024-02-02 ENCOUNTER — Ambulatory Visit: Payer: Self-pay | Admitting: Physician Assistant

## 2024-02-03 LAB — MYCOPLASMA / UREAPLASMA CULTURE
Mycoplasma hominis Culture: NEGATIVE
Ureaplasma urealyticum: NEGATIVE

## 2024-02-09 ENCOUNTER — Encounter: Payer: Self-pay | Admitting: Radiation Oncology

## 2024-02-09 ENCOUNTER — Ambulatory Visit
Admission: RE | Admit: 2024-02-09 | Discharge: 2024-02-09 | Disposition: A | Discharge status: Home / Self Care | Source: Ambulatory Visit | Attending: Radiation Oncology | Admitting: Radiation Oncology

## 2024-02-09 VITALS — BP 118/73 | HR 78 | Temp 98.6°F | Resp 12 | Wt 171.5 lb

## 2024-02-09 DIAGNOSIS — R351 Nocturia: Secondary | ICD-10-CM | POA: Insufficient documentation

## 2024-02-09 DIAGNOSIS — B356 Tinea cruris: Secondary | ICD-10-CM | POA: Insufficient documentation

## 2024-02-09 DIAGNOSIS — Z923 Personal history of irradiation: Secondary | ICD-10-CM | POA: Diagnosis not present

## 2024-02-09 DIAGNOSIS — Z8559 Personal history of malignant neoplasm of other urinary tract organ: Secondary | ICD-10-CM | POA: Diagnosis not present

## 2024-02-09 DIAGNOSIS — C679 Malignant neoplasm of bladder, unspecified: Secondary | ICD-10-CM

## 2024-02-09 DIAGNOSIS — C680 Malignant neoplasm of urethra: Secondary | ICD-10-CM | POA: Diagnosis not present

## 2024-02-09 NOTE — Progress Notes (Signed)
 Radiation Oncology Follow up Note  Name: [name]   Date:   02/09/2024 MRN:  [id number] DOB: 1935/06/14    This 89 y.o. 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  REFERRING PROVIDER: Jimmy Charlie FERNS, MD  HPI: Patient is an 89 year old male now at 5 months having completed concurrent chemoradiation therapy for stage II high-grade urethral carcinoma.  Seen today in follow-up he is doing fairly well.  He does have some nocturia bowels are fine.  He had cystoscopy by [facility] at the end of May showing no evidence of disease.  He also had CT scan in May which I have reviewed showing no evidence of metastatic disease.  There was some mild thickening and perivascular stranding about the nondistended urinary bladder which may reflect post treatment changes.  Patient is having some itching in his perineum compatible with fungal.  COMPLICATIONS OF TREATMENT: none  FOLLOW UP COMPLIANCE: keeps appointments   PHYSICAL EXAM:  BP 118/73   Pulse 78   Temp 98.6 F (37 C) (Tympanic)   Resp 12   Wt 171 lb 8 oz (77.8 kg)   BMI 23.26 kg/m  Well-developed well-nourished patient in NAD. HEENT reveals PERLA, EOMI, discs not visualized.  Oral cavity is clear. No oral mucosal lesions are identified. Neck is clear without evidence of cervical or supraclavicular adenopathy. Lungs are clear to A&P. Cardiac examination is essentially unremarkable with regular rate and rhythm without murmur rub or thrill. Abdomen is benign with no organomegaly or masses noted. Motor sensory and DTR levels are equal and symmetric in the upper and lower extremities. Cranial nerves II through XII are grossly intact. Proprioception is intact. No peripheral adenopathy or edema is identified. No motor or sensory levels are noted. Crude visual fields are within normal range.  RADIOLOGY  RESULTS: CT scan reviewed compatible with above-stated findings  PLAN: Present time patient is now out 5 months with no evidence of disease.  And pleased with his overall progress I have recommended some Lotrimin or Tinactin cream spray for his jock itch.  I am to turn follow-up care over to medical oncology as well as urology.  I would be happy to reevaluate the patient in time the future should that be indicated.  I would like to take this opportunity to thank you for allowing me to participate in the care of your patient.SABRA Marcey Penton, MD

## 2024-03-27 ENCOUNTER — Ambulatory Visit (INDEPENDENT_AMBULATORY_CARE_PROVIDER_SITE_OTHER): Admitting: Urology

## 2024-03-27 ENCOUNTER — Encounter: Payer: Self-pay | Admitting: Urology

## 2024-03-27 VITALS — BP 98/61 | HR 72 | Ht 72.0 in | Wt 165.0 lb

## 2024-03-27 DIAGNOSIS — N3289 Other specified disorders of bladder: Secondary | ICD-10-CM | POA: Diagnosis not present

## 2024-03-27 DIAGNOSIS — R310 Gross hematuria: Secondary | ICD-10-CM | POA: Diagnosis not present

## 2024-03-27 DIAGNOSIS — C689 Malignant neoplasm of urinary organ, unspecified: Secondary | ICD-10-CM | POA: Diagnosis not present

## 2024-03-27 LAB — URINALYSIS, COMPLETE
Bilirubin, UA: NEGATIVE
Glucose, UA: NEGATIVE
Leukocytes,UA: NEGATIVE
Nitrite, UA: NEGATIVE
Specific Gravity, UA: 1.03 (ref 1.005–1.030)
Urobilinogen, Ur: 4 mg/dL — ABNORMAL HIGH (ref 0.2–1.0)
pH, UA: 5.5 (ref 5.0–7.5)

## 2024-03-27 LAB — MICROSCOPIC EXAMINATION

## 2024-03-27 NOTE — Progress Notes (Signed)
   03/27/24  CC:  Chief Complaint  Patient presents with   Cysto   Urologic history:  Muscle invasive high-grade urothelial carcinoma invading prostatic fossa.  Not a surgical candidate and treated with chemotherapy/radiation Obstructive voiding symptoms and recurrent gross hematuria and underwent HoLEP by Dr. Francisca 06/10/2023 prior to chemoradiation  HPI: [name] has no complaints today.  Initial surveillance cystoscopy and cytology were negative  Blood pressure 98/61, pulse 72, height 6' (1.829 m), weight 165 lb (74.8 kg). NED. A&Ox3.   No respiratory distress   Abd soft, NT, ND Normal phallus with bilateral descended testicles  Cystoscopy Procedure Note  Patient identification was confirmed, informed consent was obtained, and patient was prepped using Betadine solution.  Lidocaine  jelly was administered per urethral meatus.     Pre-Procedure: - Inspection reveals a normal caliber urethra meatus.  Procedure: The flexible cystoscope was introduced without difficulty - No urethral strictures/lesions are present. - Open prostatic fossawithout tumor/lesion - Open bladder neck - Bladder mucosa  reveals no ulcers, tumors, or lesions - No bladder stones - Moderate trabeculation/cellules  Retroflexion shows no abnormal findings   Post-Procedure: - Patient tolerated the procedure well  Assessment/ Plan: No evidence recurrent tumor/mucosal abnormalities Urine cytology sent Follow-up surveillance cystoscopy 3 months    William JAYSON Barba, MD

## 2024-03-30 ENCOUNTER — Other Ambulatory Visit: Admitting: Urology

## 2024-04-04 LAB — CYTOLOGY PLUS MONITORING PROFILE: PAP & FEULGEN

## 2024-04-07 ENCOUNTER — Ambulatory Visit: Payer: Self-pay | Admitting: Urology

## 2024-04-11 ENCOUNTER — Ambulatory Visit: Attending: Internal Medicine | Admitting: Internal Medicine

## 2024-04-11 ENCOUNTER — Encounter: Payer: Self-pay | Admitting: Internal Medicine

## 2024-04-11 VITALS — BP 110/80 | HR 74 | Ht 72.0 in | Wt 173.2 lb

## 2024-04-11 DIAGNOSIS — I5032 Chronic diastolic (congestive) heart failure: Secondary | ICD-10-CM | POA: Insufficient documentation

## 2024-04-11 DIAGNOSIS — Z86718 Personal history of other venous thrombosis and embolism: Secondary | ICD-10-CM | POA: Diagnosis not present

## 2024-04-11 DIAGNOSIS — I255 Ischemic cardiomyopathy: Secondary | ICD-10-CM | POA: Insufficient documentation

## 2024-04-11 DIAGNOSIS — I2510 Atherosclerotic heart disease of native coronary artery without angina pectoris: Secondary | ICD-10-CM | POA: Diagnosis not present

## 2024-04-11 DIAGNOSIS — E785 Hyperlipidemia, unspecified: Secondary | ICD-10-CM | POA: Insufficient documentation

## 2024-04-11 NOTE — Progress Notes (Signed)
 Cardiology Office Note:  .   Date:  04/11/2024  ID:  [name], DOB June 19, 1935, MRN [id number] PCP: Jimmy Charlie FERNS, MD  [facility] Providers Cardiologist:  Lonni Hanson, MD     History of Present Illness: .   [name] is a 89 y.o. male with history of coronary artery disease status post STEMI with primary PCI proximal LAD (11/2021), hypertension, hyperlipidemia, HFrEF due to ischemic cardiomyopathy, DVT, and BPH, and bladder cancer,, who presents for follow-up of coronary artery disease and HFrEF.  I last saw him in 07/2023, at which time he was feeling well.  Left lower extremity swelling associated with his DVT had improved but not completely resolved.  Due to sinus bradycardia and first-degree AV block, we agreed to stop metoprolol .  He also noted persistent intermittent hematuria in the setting of apixaban  use.  He was encouraged to reach out to his urologist for further directions.  Today, [name] reports that he has been doing fairly well though he is bothered by chronic low energy that has been present ever since his MI in 2023.  He also complains of pain in the left leg that begins near the knee and travels down the shin to his ankle and forefoot.  He denies trauma.  It is most pronounced when he lies in bed or sits in his recliner.  He does not have any pain in the left leg with walking.  He denies swelling in either lower extremity.  He has not had any bleeding, remaining on apixaban .  He also denies chest pain, shortness of breath, palpitations, and lightheadedness.  ROS: See HPI  Studies Reviewed: SABRA   EKG Interpretation Date/Time:  Wednesday April 11 2024 14:40:36 EDT Ventricular Rate:  74 PR Interval:  216 QRS Duration:  138 QT Interval:  422 QTC Calculation: 468 R Axis:   -81  Text Interpretation: Sinus rhythm with 1st degree A-V block Left axis deviation Right bundle branch block Septal infarct (cited on or before 06-Jun-2023) When compared with  ECG of 20-Jul-2023 09:59, Premature atrial complexes are no longer Present HEART RATE has increased Confirmed by [name], Lonni [phone]) on 04/11/2024 3:24:53 PM    Risk Assessment/Calculations:             Physical Exam:   VS:  BP 110/80 (BP Location: Left Arm, Patient Position: Sitting, Cuff Size: Normal)   Pulse 74   Ht 6' (1.829 m)   Wt 173 lb 4 oz (78.6 kg)   SpO2 97%   BMI 23.50 kg/m    Wt Readings from Last 3 Encounters:  04/11/24 173 lb 4 oz (78.6 kg)  03/27/24 165 lb (74.8 kg)  02/09/24 171 lb 8 oz (77.8 kg)    General:  NAD. Neck: No JVD or HJR. Lungs: Clear to auscultation bilaterally without wheezes or crackles. Heart: Regular rate and rhythm with occasional extrasystoles.  No murmurs, rubs, or gallops. Abdomen: Soft, nontender, nondistended. Extremities: No lower extremity edema.  2+ pedal pulses in the left foot.  ASSESSMENT AND PLAN: .    Coronary artery disease and hyperlipidemia: No angina reported.  Continue apixaban  and lieu of aspirin  and atorvastatin  for secondary prevention.  If apixaban  is discontinued in the future, recommend resuming clopidogrel  75 mg daily.  Chronic heart failure with improved ejection fraction: [name] appears euvolemic on exam with NYHA class II symptoms.  His chronic fatigue is likely multifactorial, partially driven by his CAD/cardiomyopathy.  However I suspect his bladder cancer and  chemotherapy also have contributed to this.  No further testing/intervention recommended at this time.  Continue losartan .  Defer rechallenging with beta-blocker given first-degree AV block.  DVT: Patient remains on apixaban .  I will reach out to Drs. Babara and Letvak regarding their recommendations for long-term anticoagulation.  Bladder cancer: No further hematuria reported.  Continue follow-up with urology/oncology.    Dispo: Return to clinic in 1 year.  Signed, Lonni Hanson, MD

## 2024-04-11 NOTE — Patient Instructions (Signed)

## 2024-05-22 DIAGNOSIS — Z23 Encounter for immunization: Secondary | ICD-10-CM | POA: Diagnosis not present

## 2024-06-01 NOTE — Telephone Encounter (Signed)
 Encounter opened in error.

## 2024-06-07 ENCOUNTER — Ambulatory Visit: Payer: Self-pay

## 2024-06-07 NOTE — Telephone Encounter (Signed)
 Noted. Will evaluate in office

## 2024-06-07 NOTE — Telephone Encounter (Signed)
 FYI Only or Action Required?: FYI only for provider: appointment scheduled on 06/08/2024.  Patient was last seen in primary care on 11/16/2023 by Jimmy Charlie FERNS, MD.  Called Nurse Triage reporting Leg Pain.  Symptoms began week or so.  Interventions attempted: Nothing.  Symptoms are: unchanged.  Triage Disposition: See Physician Within 24 Hours  Patient/caregiver understands and will follow disposition?: Yes   Copied from CRM #[id number]. Topic: Clinical - Red Word Triage >> Jun 07, 2024  9:29 AM Victoria A wrote: [facility] that prompted transfer to Nurse Triage: Patient's wife said that patient is having pain in leg and knee left. Has been for this entire week. Reason for Disposition  [1] MODERATE pain (e.g., interferes with normal activities, limping) AND [2] present > 3 days  [1] Painful rash AND [2] multiple small blisters grouped together (i.e., dermatomal distribution or band or stripe)  Answer Assessment - Initial Assessment Questions 1. ONSET: When did the pain start?       X week or so 2. LOCATION: Where is the pain located?      Left leg and knee 3. PAIN: How bad is the pain?    (Scale 1-10; or mild, moderate, severe)     Unsure per wife - pt is limping 4. WORK OR EXERCISE: Has there been any recent work or exercise that involved this part of the body?      na 5. CAUSE: What do you think is causing the leg pain?     unknown 6. OTHER SYMPTOMS: Do you have any other symptoms? (e.g., chest pain, back pain, breathing difficulty, swelling, rash, fever, numbness, weakness)     Tired all the time 7. PREGNANCY: Is there any chance you are pregnant? When was your last menstrual period?     na  Clemens a while ago after sliding in the mud  Protocols used: Leg Pain-A-AH

## 2024-06-08 ENCOUNTER — Ambulatory Visit (INDEPENDENT_AMBULATORY_CARE_PROVIDER_SITE_OTHER): Admitting: Nurse Practitioner

## 2024-06-08 VITALS — BP 122/60 | HR 106 | Temp 98.1°F | Ht 72.0 in | Wt 171.2 lb

## 2024-06-08 DIAGNOSIS — M79605 Pain in left leg: Secondary | ICD-10-CM

## 2024-06-08 DIAGNOSIS — Z7901 Long term (current) use of anticoagulants: Secondary | ICD-10-CM

## 2024-06-08 DIAGNOSIS — Z86718 Personal history of other venous thrombosis and embolism: Secondary | ICD-10-CM | POA: Diagnosis not present

## 2024-06-08 MED ORDER — PREDNISONE 20 MG PO TABS: ORAL_TABLET | ORAL | 0 refills | Status: AC

## 2024-06-08 MED ORDER — OMEPRAZOLE 20 MG PO CPDR: 20.0000 mg | DELAYED_RELEASE_CAPSULE | Freq: Every day | ORAL | 0 refills | Status: AC

## 2024-06-08 NOTE — Patient Instructions (Addendum)
 Nice to see you today  I will reach out to you in about a week and see how you are doing  Take the prednisone  in the morning with food  You can also take tylenol  if needed through out the day You can take 1-2 tablets every 8 hours (no more than 3000mg  a day )

## 2024-06-08 NOTE — Progress Notes (Signed)
 Established Patient Office Visit  Subjective   Patient ID: [name], male    DOB: [birth year]  Age: 89 y.o. MRN: [id number]  Chief Complaint  Patient presents with   Leg Pain    Pt complains of L leg pain that started 2-3 months ago but is getting worse.     Leg Pain  Pertinent negatives include no tingling.     With a history of DVT, HTN, CHF, OA, CA, PMR  Discussed the use of AI scribe software for clinical note transcription with the patient, who gave verbal consent to proceed.  History of Present Illness [name] is an 89 year old male who presents with left leg pain.  He has been experiencing left leg pain for the past two to three months following a fall where he slipped in the mud while carrying a bucket, sustaining a cut on his leg. The pain has worsened over the past week.  The pain is described as a tingling sensation that runs from the knee to the ankle and is constant in nature. Wiggling his toes exacerbates the pain. No numbness or weakness is reported in the leg. No pain is reported in the right leg, back, or hip.  He has not used any over-the-counter medications or topical treatments for the pain. He considered taking an oxycodone  pill left over from a previous surgery but only did so after attending a wedding, which helped him sleep.  He is currently taking apixaban  for a blood clot diagnosed in December of the previous year and has not missed any doses. His past medical history includes bilateral knee replacements in 2008 and 2009, and he maintains a routine of daily PT exercises.  He reports difficulty sleeping due to the pain, particularly noting a sleepless night two days prior to the visit.  Patient spouse is present and helps with history    Review of Systems  Constitutional:  Negative for chills and fever.  Respiratory:  Negative for shortness of breath.   Cardiovascular:  Negative for chest pain.  Musculoskeletal:  Positive for joint pain.   Neurological:  Negative for tingling, weakness and headaches.      Objective:     BP 122/60   Pulse (!) 106   Temp 98.1 F (36.7 C) (Oral)   Ht 6' (1.829 m)   Wt 171 lb 3.2 oz (77.7 kg)   SpO2 97%   BMI 23.22 kg/m  BP Readings from Last 3 Encounters:  06/08/24 122/60  04/11/24 110/80  03/27/24 98/61   Wt Readings from Last 3 Encounters:  06/08/24 171 lb 3.2 oz (77.7 kg)  04/11/24 173 lb 4 oz (78.6 kg)  03/27/24 165 lb (74.8 kg)   SpO2 Readings from Last 3 Encounters:  06/08/24 97%  04/11/24 97%  12/23/23 99%      Physical Exam Vitals and nursing note reviewed.  Constitutional:      Appearance: Normal appearance.  Cardiovascular:     Rate and Rhythm: Normal rate and regular rhythm.     Heart sounds: Normal heart sounds.  Pulmonary:     Effort: Pulmonary effort is normal.     Breath sounds: Normal breath sounds.  Musculoskeletal:     Lumbar back: No tenderness or bony tenderness. Negative right straight leg raise test and negative left straight leg raise test.     Right lower leg: No edema.     Left lower leg: No edema.     Comments: Tenderness along the left  lower leg Varicose veins on bilateral lower extremities Bilateral lower extremity strength 5/5  Bilateral calves 35cm  Neurological:     Mental Status: He is alert.      No results found for any visits on 06/08/24.    The ASCVD Risk score (Arnett DK, et al., 2019) failed to calculate for the following reasons:   The 2019 ASCVD risk score is only valid for ages 22 to 73   Risk score cannot be calculated because patient has a medical history suggesting prior/existing ASCVD    Assessment & Plan:   Problem List Items Addressed This Visit   None Visit Diagnoses       Left leg pain    -  Primary   Relevant Medications   omeprazole (PRILOSEC) 20 MG capsule   predniSONE  (DELTASONE ) 20 MG tablet     History of DVT (deep vein thrombosis)         Anticoagulated         Assessment and  Plan Assessment & Plan Left leg pain Chronic left leg pain, likely nerve-related. No imaging needed as pain is not localized to previous injury site. - Prescribed prednisone  with food in the morning. - Prescribed stomach protectant with prednisone  to prevent gastric ulcers. - Sent prescriptions to [facility] pharmacy. - Follow-up in one week to assess treatment response. - Refer to sports medicine specialist, Dr. Ubaldo, if no improvement. - Patient has not missed any doses of anticoagulation.  In the setting of cancer pathology consider imaging to make sure no abnormalities to the bone   Return if symptoms worsen or fail to improve, for As scheduled .    William Crandall, NP

## 2024-06-11 ENCOUNTER — Telehealth: Payer: Self-pay | Admitting: Nurse Practitioner

## 2024-06-11 DIAGNOSIS — M79605 Pain in left leg: Secondary | ICD-10-CM

## 2024-06-11 NOTE — Telephone Encounter (Signed)
 See if the prednisone  has helped and if he has tried tylenol 

## 2024-06-11 NOTE — Telephone Encounter (Signed)
 Pt complains that prednisone  is not working at all but when he used tylenol  it helps with the pain a lot. States he just needs pain to be relieved.

## 2024-06-11 NOTE — Telephone Encounter (Signed)
-----   Message from [facility] sent at 06/08/2024 11:27 AM EST ----- Regarding: leg pain See if the prednisone  has helped and if he has tried tylenol  Can consider a US  if no improvement

## 2024-06-12 NOTE — Telephone Encounter (Signed)
 He can discontinue the prednisone  if it is not helping. Continue using tylenol   I am happy to order an xrray of his femur and Tib/fib of that leg

## 2024-06-12 NOTE — Telephone Encounter (Signed)
 Spoke with patient and relayed information. Pt verbalized understanding.  Pt would like an xray done at [place] location.

## 2024-06-13 NOTE — Telephone Encounter (Signed)
 I have ordered the xrays for   [facility] - off [address] [place], [place] , [place] [address] [phone] Open 8am-5pm .  They should be able to walk in any time between 8am and 5pm

## 2024-06-13 NOTE — Addendum Note (Signed)
 Addended by: WENDEE LYNWOOD HERO on: 06/13/2024 12:52 PM   Modules accepted: Orders

## 2024-06-14 ENCOUNTER — Ambulatory Visit: Payer: Self-pay

## 2024-06-14 NOTE — Telephone Encounter (Signed)
 FYI Only or Action Required?: Action required by provider: update on patient condition. Pt calling to report prednisone  and tylenol  haven't helped pain since last OV on 11/7 with Lynwood Crandall, NP. Wanting to know next steps.  Patient was last seen in primary care on 06/08/2024 by Crandall Lynwood HERO, NP.  Called Nurse Triage reporting Leg Pain.  Symptoms began several months ago.  Interventions attempted: OTC medications: Tylenol  and Prescription medications: Prednisone .  Symptoms are: gradually worsening.  Triage Disposition: Call PCP Within 24 Hours (overriding See PCP Within 2 Weeks)  Patient/caregiver understands and will follow disposition?: Yes  Copied from CRM #[id number]. Topic: Clinical - Red Word Triage >> Jun 14, 2024  4:13 PM Roselie BROCKS wrote: Red Word that prompted transfer to Nurse Triage: Patient states she is having severe pain in his left leg, over the counter meds are not helping.  Call dropped while on hold. Unable to reach pt on call back Reason for Disposition  Leg pain or muscle cramp is a chronic symptom (recurrent or ongoing AND present > 4 weeks)  Answer Assessment - Initial Assessment Questions Pt reports onset of pain in left leg and foot 2-3 months ago after falling while carrying a bucket. Saw Lynwood Crandall NP on 11/7 to eval leg. Plan at that time to try prednisone  and tylenol  and to consider US  or sports med referral if inadequate relief. Pt calling to report meds haven't helped much and still having moderate-severe pain. Orders sent for xray of left leg, pt and wife report they haven't been done yet as they haven't received instructions on how to get them done. My Chart message sent on 11/12 to pt with instructions on obtaining xray, pt not aware message was sent. Instructions relayed to pt and wife and advised they call imaging dept tomorrow to schedule appt asap. Office note from 11/7 stated provider would call pt in about a week to follow up and proceed with next  steps. Sending message to office with update on pt condition.  1. ONSET: When did the pain start?      2-3 months ago  2. LOCATION: Where is the pain located?      Entire left leg and left foot  3. PAIN: How bad is the pain?    (Scale 1-10; or mild, moderate, severe)     Moderate-severe  4. WORK OR EXERCISE: Has there been any recent work or exercise that involved this part of the body?      Reports falling while carrying a bucket 2-3 months ago  5. CAUSE: What do you think is causing the leg pain?     Fall 2-3 months ago  6. OTHER SYMPTOMS: Do you have any other symptoms? (e.g., chest pain, back pain, breathing difficulty, swelling, rash, fever, numbness, weakness)     Denies redness or swelling or fever. No numbness or tingling. Denies SOB or CP. No open wounds.  Protocols used: Leg Pain-A-AH

## 2024-06-15 ENCOUNTER — Ambulatory Visit
Admission: RE | Admit: 2024-06-15 | Discharge: 2024-06-15 | Disposition: A | Discharge status: Home / Self Care | Source: Ambulatory Visit | Attending: Nurse Practitioner | Admitting: Nurse Practitioner

## 2024-06-15 DIAGNOSIS — M79605 Pain in left leg: Secondary | ICD-10-CM | POA: Diagnosis not present

## 2024-06-15 DIAGNOSIS — M1612 Unilateral primary osteoarthritis, left hip: Secondary | ICD-10-CM | POA: Diagnosis not present

## 2024-06-15 NOTE — Telephone Encounter (Signed)
 Adina has placed an order for that xray. Please call the imaging center to schedule an appointment.    [facility] - [place] [address] [address] [place] , [place] [address] [phone] Open 8am-5pm      Nellie Hummer, CMA   This was a mychart message sent to the patient with my recommendations. He has not read it. The xray orders are placed  Can we call and relay this information to him please

## 2024-06-15 NOTE — Telephone Encounter (Signed)
 Called and spoke with patient.  States that him and wife called the imaging center and were told he can come in without an appointment.  No further questions or concerns.

## 2024-06-19 ENCOUNTER — Ambulatory Visit: Payer: Self-pay | Admitting: Nurse Practitioner

## 2024-06-19 NOTE — Telephone Encounter (Signed)
-----   Message from [facility] T sent at 06/19/2024  2:35 PM EST ----- Called patient reviewed all information and repeated back to me.  Pt complains of having pain all the time now and states that the tylenol  is no longer working.  ----- Message ----- From: Wendee Lynwood HERO, NP Sent: 06/19/2024   8:50 AM EST To: Wendee Gunnels  Notified via My Chart  ----- Message ----- From: Rebecka, Rad Results In Sent: 06/16/2024   4:54 PM EST To: Lynwood HERO Wendee, NP

## 2024-06-19 NOTE — Telephone Encounter (Signed)
 Can we get him scheduled with Dr. Watt for another opinion please

## 2024-06-26 ENCOUNTER — Inpatient Hospital Stay: Attending: Oncology

## 2024-06-26 ENCOUNTER — Inpatient Hospital Stay: Admitting: Oncology

## 2024-06-26 ENCOUNTER — Encounter: Payer: Self-pay | Admitting: Oncology

## 2024-06-26 ENCOUNTER — Telehealth: Payer: Self-pay | Admitting: Oncology

## 2024-06-26 VITALS — BP 135/75 | HR 107 | Resp 18 | Wt 164.8 lb

## 2024-06-26 DIAGNOSIS — B370 Candidal stomatitis: Secondary | ICD-10-CM | POA: Diagnosis not present

## 2024-06-26 DIAGNOSIS — C689 Malignant neoplasm of urinary organ, unspecified: Secondary | ICD-10-CM

## 2024-06-26 DIAGNOSIS — Z7901 Long term (current) use of anticoagulants: Secondary | ICD-10-CM | POA: Diagnosis not present

## 2024-06-26 DIAGNOSIS — Z86718 Personal history of other venous thrombosis and embolism: Secondary | ICD-10-CM | POA: Insufficient documentation

## 2024-06-26 DIAGNOSIS — D490 Neoplasm of unspecified behavior of digestive system: Secondary | ICD-10-CM | POA: Insufficient documentation

## 2024-06-26 DIAGNOSIS — Z8551 Personal history of malignant neoplasm of bladder: Secondary | ICD-10-CM | POA: Diagnosis present

## 2024-06-26 DIAGNOSIS — Z08 Encounter for follow-up examination after completed treatment for malignant neoplasm: Secondary | ICD-10-CM | POA: Insufficient documentation

## 2024-06-26 DIAGNOSIS — C679 Malignant neoplasm of bladder, unspecified: Secondary | ICD-10-CM | POA: Diagnosis not present

## 2024-06-26 LAB — CBC WITH DIFFERENTIAL (CANCER CENTER ONLY)
Abs Immature Granulocytes: 0.03 K/uL (ref 0.00–0.07)
Basophils Absolute: 0 K/uL (ref 0.0–0.1)
Basophils Relative: 1 %
Eosinophils Absolute: 0 K/uL (ref 0.0–0.5)
Eosinophils Relative: 1 %
HCT: 38.4 % — ABNORMAL LOW (ref 39.0–52.0)
Hemoglobin: 12.2 g/dL — ABNORMAL LOW (ref 13.0–17.0)
Immature Granulocytes: 1 %
Lymphocytes Relative: 11 %
Lymphs Abs: 0.7 K/uL (ref 0.7–4.0)
MCH: 28.4 pg (ref 26.0–34.0)
MCHC: 31.8 g/dL (ref 30.0–36.0)
MCV: 89.5 fL (ref 80.0–100.0)
Monocytes Absolute: 0.5 K/uL (ref 0.1–1.0)
Monocytes Relative: 8 %
Neutro Abs: 5.1 K/uL (ref 1.7–7.7)
Neutrophils Relative %: 78 %
Platelet Count: 223 K/uL (ref 150–400)
RBC: 4.29 MIL/uL (ref 4.22–5.81)
RDW: 14.4 % (ref 11.5–15.5)
WBC Count: 6.4 K/uL (ref 4.0–10.5)
nRBC: 0 % (ref 0.0–0.2)

## 2024-06-26 LAB — CMP (CANCER CENTER ONLY)
ALT: 15 U/L (ref 0–44)
AST: 23 U/L (ref 15–41)
Albumin: 3.3 g/dL — ABNORMAL LOW (ref 3.5–5.0)
Alkaline Phosphatase: 102 U/L (ref 38–126)
Anion gap: 9 (ref 5–15)
BUN: 29 mg/dL — ABNORMAL HIGH (ref 8–23)
CO2: 23 mmol/L (ref 22–32)
Calcium: 9.4 mg/dL (ref 8.9–10.3)
Chloride: 105 mmol/L (ref 98–111)
Creatinine: 0.76 mg/dL (ref 0.61–1.24)
GFR, Estimated: >60 mL/min (ref >60)
Glucose, Bld: 121 mg/dL — ABNORMAL HIGH (ref 70–99)
Potassium: 3.9 mmol/L (ref 3.5–5.1)
Sodium: 137 mmol/L (ref 135–145)
Total Bilirubin: 0.9 mg/dL (ref 0.0–1.2)
Total Protein: 7.5 g/dL (ref 6.5–8.1)

## 2024-06-26 MED ORDER — NYSTATIN 100000 UNIT/ML MT SUSP: 5.0000 mL | Freq: Four times a day (QID) | OROMUCOSAL | 0 refills | Status: AC

## 2024-06-26 NOTE — Assessment & Plan Note (Signed)
 Recommend nystatin  oral rinse

## 2024-06-26 NOTE — Progress Notes (Signed)
 William Schier T. Felder Lebeda, MD, CAQ Sports Medicine [facility] [address] [place], [address]  Phone: [phone]  FAX: [phone]  [name] - 89 y.o. male  MRN [id number]  Date of Birth: 1935/07/09  Date: 06/27/2024  PCP: No primary care provider on file.  Referral: Jimmy Charlie FERNS, MD  Chief Complaint  Patient presents with   Leg Pain    Left (Wants 2nd opinion)   Subjective:   [name] is a 89 y.o. very pleasant male patient with Body mass index is 22.53 kg/m. who presents with the following:  Discussed the use of AI scribe software for clinical note transcription with the patient, who gave verbal consent to proceed.  Patient presents with ongoing left-sided leg pain.  Radiographs from May 15, 2024 of the tib-fib, femur were all negative for acute fracture. There is moderate hip osteoarthritis and a history of left-sided total knee arthroplasty.  He does have a history of lower extremity DVT and is anticoagulated  He was given a course of prednisone  by Mr. Cable. History of Present Illness [name] is an 89 year old male who presents with persistent left leg pain.  He has been experiencing persistent pain in his left leg, primarily from the knee down to the ankle, for approximately one to two months. The pain began after a fall where he slipped in the mud and hit a bucket, resulting in a cut on his leg. Although the cut has healed, the pain persists.  The pain is constant and radiates from the knee down, causing him to limp and disrupt his sleep. Initially, Tylenol  provided relief, but the pain returned and has persisted. Prednisone  was ineffective, and a topical hemp product provided by his sister had uncertain results. He performs daily exercises to maintain strength, which can exacerbate the pain. He is currently taking Tylenol  every eight hours and has been on blood thinners since last Christmas due to a  previous blood clot.  No numbness, tingling, or balance issues. He reports no recent accidents while driving, although he prefers not to drive at night. He has tried other treatments, such as liniment or ice, but did not report significant relief.  X-rays from June 15, 2024 are independently reviewed by myself and there is no loosening at the knee arthroplasty.  Knee arthroplasty looks normal.  Review of Systems is noted in the HPI, as appropriate  Objective:   BP 90/60   Pulse 98   Temp 98.3 F (36.8 C) (Temporal)   Ht 6' (1.829 m)   Wt 166 lb 2 oz (75.4 kg)   SpO2 96%   BMI 22.53 kg/m   GEN: No acute distress; alert,appropriate. PULM: Breathing comfortably in no respiratory distress PSYCH: Normally interactive.   Left knee: Full extension and flexion to 105 Minimal to mild effusion Pain with moving the patella facets, and poor motion at the patella Medial greater than lateral joint line tenderness No pain at the tibial tubercle, tibial plateau, or fibular head Strength is 5/5 throughout He does have some quadriceps wasting  Laboratory and Imaging Data:  Assessment and Plan:     ICD-10-CM   1. Left leg pain  M79.605     2. Chronic pain of left knee  M25.562    G89.29     3. Primary osteoarthritis of left hip  M16.12     4. History of total knee arthroplasty, left  [phone]  Assessment & Plan Left knee and leg pain Chronic pain from knee to ankle, likely post-posttraumatic or arthritis-related.  - Prescribed extended prednisone  course. - Recommended Voltaren  gel four times daily. - Advised icing knee 20 minutes once or twice daily. - Plan referral to physical therapy if no improvement in 10-14 days.  Osteoarthritis of left hip Mild to moderate osteoarthritis on x-ray. - Monitor symptoms and manage conservatively.  Medication Management during today's office visit: Meds ordered this encounter  Medications   predniSONE  (DELTASONE ) 20 MG tablet     Sig: 2 tabs po for 7 days, then 1 tab po for 7 days    Dispense:  21 tablet    Refill:  0   There are no discontinued medications.  Orders placed today for conditions managed today: No orders of the defined types were placed in this encounter.   Disposition: No follow-ups on file.  [employer] speech-to-text software was used for transcription in this dictation.  Possible transcriptional errors can occur using Animal nutritionist.   Signed,  Jacques DASEN. Kohler Pellerito, MD   Outpatient Encounter Medications as of 06/27/2024  Medication Sig   apixaban  (ELIQUIS ) 5 MG TABS tablet Take 1 tablet (5 mg total) by mouth 2 (two) times daily.   atorvastatin  (LIPITOR) 40 MG tablet TAKE 1 TABLET BY MOUTH DAILY GENERIC EQUIVALENT FOR LIPITOR   Cyanocobalamin  (B-12) 2500 MCG TABS Take 2,500 mcg by mouth daily.   losartan  (COZAAR ) 25 MG tablet TAKE 1 TABLET BY MOUTH DAILY GENERIC EQUIVALENT FOR COZAAR    Multiple Vitamins-Minerals (CENTRUM PO) Take 1 tablet by mouth daily.   nystatin  (MYCOSTATIN ) 100000 UNIT/ML suspension Take 5 mLs (500,000 Units total) by mouth 4 (four) times daily.   omeprazole  (PRILOSEC) 20 MG capsule Take 1 capsule (20 mg total) by mouth daily.   polyethylene glycol (MIRALAX  / GLYCOLAX ) 17 g packet Take 17 g by mouth daily as needed for moderate constipation.   predniSONE  (DELTASONE ) 20 MG tablet 2 tabs po for 7 days, then 1 tab po for 7 days   No facility-administered encounter medications on file as of 06/27/2024.
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
## 2024-06-26 NOTE — Assessment & Plan Note (Signed)
 Small pancreatic lesion, likely IPMN Will repeat CT abdomen pelvis-1 year interval

## 2024-06-26 NOTE — Telephone Encounter (Signed)
 Called pt to sched CT - no vm set up - will call pt again - [facility]

## 2024-06-26 NOTE — Assessment & Plan Note (Addendum)
 Urothelial carcinoma of prostatic urethra/bladder neck, cT2, muscle invasive.  He is not a candidate for radical cystectomy.  S/p bi weekly Gemcitabine  27mg /m2 concurrently with radiation.  Labs are reviewed and discussed with patient. Continue follow-up with urology

## 2024-06-27 ENCOUNTER — Other Ambulatory Visit: Payer: Self-pay | Admitting: Urology

## 2024-06-27 ENCOUNTER — Encounter: Payer: Self-pay | Admitting: Family Medicine

## 2024-06-27 ENCOUNTER — Encounter: Payer: Self-pay | Admitting: Urology

## 2024-06-27 ENCOUNTER — Ambulatory Visit (INDEPENDENT_AMBULATORY_CARE_PROVIDER_SITE_OTHER): Admitting: Urology

## 2024-06-27 ENCOUNTER — Ambulatory Visit: Admitting: Family Medicine

## 2024-06-27 VITALS — BP 101/60 | HR 56 | Ht 72.0 in | Wt 165.0 lb

## 2024-06-27 VITALS — BP 90/60 | HR 98 | Temp 98.3°F | Ht 72.0 in | Wt 166.1 lb

## 2024-06-27 DIAGNOSIS — M25562 Pain in left knee: Secondary | ICD-10-CM

## 2024-06-27 DIAGNOSIS — M1612 Unilateral primary osteoarthritis, left hip: Secondary | ICD-10-CM

## 2024-06-27 DIAGNOSIS — C689 Malignant neoplasm of urinary organ, unspecified: Secondary | ICD-10-CM | POA: Diagnosis not present

## 2024-06-27 DIAGNOSIS — M79605 Pain in left leg: Secondary | ICD-10-CM | POA: Diagnosis not present

## 2024-06-27 DIAGNOSIS — Z96652 Presence of left artificial knee joint: Secondary | ICD-10-CM

## 2024-06-27 DIAGNOSIS — R896 Abnormal cytological findings in specimens from other organs, systems and tissues: Secondary | ICD-10-CM | POA: Diagnosis not present

## 2024-06-27 DIAGNOSIS — G8929 Other chronic pain: Secondary | ICD-10-CM

## 2024-06-27 LAB — URINALYSIS, COMPLETE
Bilirubin, UA: NEGATIVE
Glucose, UA: NEGATIVE
Ketones, UA: NEGATIVE
Nitrite, UA: NEGATIVE
Specific Gravity, UA: 1.025 (ref 1.005–1.030)
Urobilinogen, Ur: 1 mg/dL (ref 0.2–1.0)
pH, UA: 5.5 (ref 5.0–7.5)

## 2024-06-27 LAB — MICROSCOPIC EXAMINATION: WBC, UA: >30 /HPF — AB (ref 0–5)

## 2024-06-27 MED ORDER — PREDNISONE 20 MG PO TABS: ORAL_TABLET | ORAL | 0 refills | Status: AC

## 2024-06-27 MED ORDER — SULFAMETHOXAZOLE-TRIMETHOPRIM 800-160 MG PO TABS
1.0000 | ORAL_TABLET | Freq: Once | ORAL | Status: AC
  Administered 2024-06-27: 1 via ORAL

## 2024-06-27 NOTE — Progress Notes (Signed)
   06/27/24  CC:  Chief Complaint  Patient presents with   Cysto   Urologic history:  Muscle invasive high-grade urothelial carcinoma invading prostatic fossa.  Not a surgical candidate and treated with chemotherapy/radiation Obstructive voiding symptoms and recurrent gross hematuria and underwent HoLEP by Dr. Francisca 06/10/2023 prior to chemoradiation  HPI: [name] has no complaints today.  Initial surveillance cystoscopy and cytology were negative  Blood pressure 98/61, pulse 72, height 6' (1.829 m), weight 165 lb (74.8 kg). NED. A&Ox3.   No respiratory distress   Abd soft, NT, ND Normal phallus with bilateral descended testicles  Cystoscopy Procedure Note  Patient identification was confirmed, informed consent was obtained, and patient was prepped using Betadine solution.  Lidocaine  jelly was administered per urethral meatus.     Pre-Procedure: - Inspection reveals a normal caliber urethral meatus.  Procedure: The flexible cystoscope was introduced without difficulty - No urethral strictures/lesions are present. - Open prostatic fossawithout tumor/lesion - Tight bladder neck - Bladder mucosa  reveals no ulcers, tumors, or lesions - No bladder stones - Moderate trabeculation/cellules  Retroflexion shows no abnormal findings   Post-Procedure: - Patient tolerated the procedure well  Assessment/ Plan: No evidence recurrent tumor/mucosal abnormalities Urine cytology sent UA was nitrite negative with pyuria.  Urine culture ordered Septra  DS given post cystoscopy Follow-up surveillance cystoscopy 3 months    William JAYSON Barba, MD

## 2024-06-27 NOTE — Patient Instructions (Signed)
 Voltaren 1% gel, over the counter ?You can apply up to 4 times a day ? ?This can be applied to any joint: knee, wrist, fingers, elbows, shoulders, feet and ankles. ?Can apply to any tendon: tennis elbow, achilles, tendon, rotator cuff or any other tendon. ? ?Minimal is absorbed in the bloodstream: ok with oral anti-inflammatory or a blood thinner. ? ?Cost is about 9 dollars  ?

## 2024-07-02 ENCOUNTER — Telehealth: Payer: Self-pay | Admitting: Oncology

## 2024-07-02 NOTE — Telephone Encounter (Signed)
 Called pt to sched CT - no answer - no vm set up - [facility]

## 2024-07-03 LAB — CULTURE, URINE COMPREHENSIVE

## 2024-07-04 ENCOUNTER — Ambulatory Visit: Payer: Self-pay | Admitting: Urology

## 2024-07-04 ENCOUNTER — Telehealth: Payer: Self-pay | Admitting: Oncology

## 2024-07-04 ENCOUNTER — Encounter: Payer: Self-pay | Admitting: Oncology

## 2024-07-04 NOTE — Telephone Encounter (Signed)
 Called pt to sched CT - pt handed phone to wife so she could make the appt - pt and pt spouse confirmed date/time/location - req appt reminder via mail - [facility]

## 2024-07-06 LAB — CYTOLOGY PLUS MONITORING PROFILE: PAP & FEULGEN

## 2024-08-09 ENCOUNTER — Telehealth: Payer: Self-pay

## 2024-08-09 ENCOUNTER — Other Ambulatory Visit: Payer: Self-pay | Admitting: Nurse Practitioner

## 2024-08-09 NOTE — Telephone Encounter (Signed)
 Dr, Babara Muller is in between providers currently with Dr. Jimmy retiring. He mentions in his note to defer to you if to continue the eliquis . Patient is requesting a refill. Do you want him to continue anticoagulation?

## 2024-08-09 NOTE — Telephone Encounter (Unsigned)
 Copied from CRM #[id number]. Topic: Clinical - Medication Refill >> Aug 09, 2024 12:29 PM Deleta RAMAN wrote: Medication: apixaban  (ELIQUIS ) 5 MG TABS tablet  Has the patient contacted their pharmacy? Yes (Agent: If no, request that the patient contact the pharmacy for the refill. If patient does not wish to contact the pharmacy document the reason why and proceed with request.) (Agent: If yes, when and what did the pharmacy advise?)  This is the patient's preferred pharmacy:  [facility] - [place], [place] - [address]    Is this the correct pharmacy for this prescription? Yes If no, delete pharmacy and type the correct one.   Has the prescription been filled recently? No  Is the patient out of the medication? Yes  Has the patient been seen for an appointment in the last year OR does the patient have an upcoming appointment? Yes  Can we respond through MyChart? No  Agent: Please be advised that Rx refills may take up to 3 business days. We ask that you follow-up with your pharmacy.

## 2024-08-09 NOTE — Telephone Encounter (Unsigned)
 Copied from CRM #[id number]. Topic: Clinical - Medication Refill >> Aug 09, 2024 12:29 PM Deleta S wrote: Medication: apixaban  (ELIQUIS ) 5 MG TABS tablet  Has the patient contacted their pharmacy? Yes (Agent: If no, request that the patient contact the pharmacy for the refill. If patient does not wish to contact the pharmacy document the reason why and proceed with request.) (Agent: If yes, when and what did the pharmacy advise?)  This is the patient's preferred pharmacy:  [facility] - [place], [place] - [address]    Is this the correct pharmacy for this prescription? Yes If no, delete pharmacy and type the correct one.   Has the prescription been filled recently? No  Is the patient out of the medication? Yes  Has the patient been seen for an appointment in the last year OR does the patient have an upcoming appointment? Yes  Can we respond through MyChart? No  Agent: Please be advised that Rx refills may take up to 3 business days. We ask that you follow-up with your pharmacy. >> Aug 09, 2024 12:36 PM Deleta RAMAN wrote: Patient wife would like to know if a small supply can be sent in to [facility] pharmacy due to pt being out

## 2024-08-10 ENCOUNTER — Telehealth: Payer: Self-pay | Admitting: Oncology

## 2024-08-10 ENCOUNTER — Other Ambulatory Visit: Payer: Self-pay

## 2024-08-10 ENCOUNTER — Ambulatory Visit: Admitting: Nurse Practitioner

## 2024-08-10 ENCOUNTER — Ambulatory Visit: Payer: Self-pay | Admitting: Nurse Practitioner

## 2024-08-10 ENCOUNTER — Telehealth: Payer: Self-pay

## 2024-08-10 ENCOUNTER — Ambulatory Visit
Admission: RE | Admit: 2024-08-10 | Discharge: 2024-08-10 | Disposition: A | Source: Ambulatory Visit | Attending: Nurse Practitioner

## 2024-08-10 VITALS — BP 124/50 | HR 88 | Temp 99.1°F | Ht 72.0 in | Wt 164.6 lb

## 2024-08-10 DIAGNOSIS — R1030 Lower abdominal pain, unspecified: Secondary | ICD-10-CM | POA: Insufficient documentation

## 2024-08-10 DIAGNOSIS — I499 Cardiac arrhythmia, unspecified: Secondary | ICD-10-CM | POA: Diagnosis not present

## 2024-08-10 LAB — COMPREHENSIVE METABOLIC PANEL WITH GFR
ALT: 12 U/L (ref 3–53)
AST: 18 U/L (ref 5–37)
Albumin: 3.3 g/dL — ABNORMAL LOW (ref 3.5–5.2)
Alkaline Phosphatase: 104 U/L (ref 39–117)
BUN: 17 mg/dL (ref 6–23)
CO2: 27 meq/L (ref 19–32)
Calcium: 9.5 mg/dL (ref 8.4–10.5)
Chloride: 103 meq/L (ref 96–112)
Creatinine, Ser: 0.64 mg/dL (ref 0.40–1.50)
GFR: 85.09 mL/min
Glucose, Bld: 119 mg/dL — ABNORMAL HIGH (ref 70–99)
Potassium: 4 meq/L (ref 3.5–5.1)
Sodium: 137 meq/L (ref 135–145)
Total Bilirubin: 0.5 mg/dL (ref 0.2–1.2)
Total Protein: 7 g/dL (ref 6.0–8.3)

## 2024-08-10 LAB — CBC
HCT: 32.9 % — ABNORMAL LOW (ref 39.0–52.0)
Hemoglobin: 11 g/dL — ABNORMAL LOW (ref 13.0–17.0)
MCHC: 33.4 g/dL (ref 30.0–36.0)
MCV: 86.6 fl (ref 78.0–100.0)
Platelets: 358 K/uL (ref 150.0–400.0)
RBC: 3.8 Mil/uL — ABNORMAL LOW (ref 4.22–5.81)
RDW: 16.7 % — ABNORMAL HIGH (ref 11.5–15.5)
WBC: 8 K/uL (ref 4.0–10.5)

## 2024-08-10 MED ORDER — APIXABAN 5 MG PO TABS: 5.0000 mg | ORAL_TABLET | Freq: Two times a day (BID) | ORAL | 0 refills | Status: DC

## 2024-08-10 MED ORDER — IOHEXOL 300 MG/ML  SOLN
100.0000 mL | Freq: Once | INTRAMUSCULAR | Status: AC | PRN
  Administered 2024-08-10: 100 mL via INTRAVENOUS

## 2024-08-10 NOTE — Telephone Encounter (Signed)
 [facility] BAIZE radiology called report on CT abd/pelvis; Randine is not sure if pt is waiting or not. Report in epic. Sending to CHRISTELLA Crandall NP and cable pool and will take note to Matt's area.

## 2024-08-10 NOTE — Assessment & Plan Note (Signed)
 Patient having lower abdominal pain in the setting of no bowel movement for extended period of time.  Patient has stopped passing gas over the last 2 days and bowel sounds not appreciated on exam today.  Patient also has a history of urothelial carcinoma.  Patient needs stat CT scan abdomen pelvis with contrast to rule out small bowel obstruction or possible metastasis to the abdomen.  Also pending CBC, CMP and UA with reflex to culture if indicated.

## 2024-08-10 NOTE — Telephone Encounter (Signed)
 Called patient to inform of below per secure chat:   can you add labs after MD please, instead of prior. Dr. Babara changed her mind lol    Patient has been notified and confirmed

## 2024-08-10 NOTE — Telephone Encounter (Signed)
 Noted. I got the report and have reviewed it

## 2024-08-10 NOTE — Telephone Encounter (Signed)
 Per secure chat please arrange pt to see me next week for anticoagulation management since Dr. LITTIE retired   Per MD okayed to use open chemo spot.  I spoke with pt and pt spouse and confirmed lab/MD appt on 1/13

## 2024-08-10 NOTE — Assessment & Plan Note (Addendum)
 History of the same.  Patient does have a sinus bradycardia with PACs in the past he is followed by cardiology Dr. Lonni HERO.  Patient had wide pulse variability today in office documented at 45 from a and then 88 from me irregular and borderline tachycardic on auscultation.  Pending EKG  EKG showed rate of 98 with sinus arrhythmia.  No signs of atrial fibrillation looks like borderline couplets

## 2024-08-10 NOTE — Addendum Note (Signed)
 Addended by: WENDEE LYNWOOD HERO on: 08/10/2024 09:26 AM   Modules accepted: Orders

## 2024-08-10 NOTE — Progress Notes (Signed)
 "  Established Patient Office Visit  Subjective   Patient ID: [name], male    DOB: [birth year]  Age: 89 y.o. MRN: [id number]  Chief Complaint  Patient presents with   Constipation    Pt complains of constipation that started 2 weeks ago. Slight pain to abdomen when pressure is applied.    Medication Refill    Eliquis      Constipation Associated symptoms include abdominal pain. Pertinent negatives include no fever, nausea or vomiting.  Medication Refill Associated symptoms include abdominal pain. Pertinent negatives include no chest pain, chills, fever, nausea or vomiting.     With a history of DVT, HTN, CHF, CAD, CHF, BPH, Urothelial carcinoma, PMR,  Last colonoscopy was 08/08/2007 that was normal.  Most recent imaging was 12/14/2023 with a CT chest abdomen pelvis with contrast.  Intestines were normal.  Did show 8 mm hypodensity in the pancreatic head likely reflecting a sidebranch IPMN in.  Stable 11 mm left lower pole renal lesion which is stable.  Aortic atherosclerosis.  Also showed no evidence of metastatic disease in the chest abdomen or pelvis.  States that Sunday a week ago he went to the bathroom and he had a large bowel movement. States that it has been 12 days since he has been to the bathroom. States that he did go on Wednesday that was balls. He will have 2 cups of coffee and 1 cup of water .  They have used some stool softeners for approx 2 weeks and it has not helped. He does use prunes that he will eat every other day and will drink cranberry juice. He states that he does not pass gas in the past couple days. He did have some oatmel today. He did  breakfast and an baked potato for dinner.      Review of Systems  Constitutional:  Negative for chills and fever.  Respiratory:  Negative for shortness of breath.   Cardiovascular:  Negative for chest pain.  Gastrointestinal:  Positive for abdominal pain and constipation. Negative for nausea and vomiting.  Genitourinary:   Positive for hematuria.  Neurological:  Negative for dizziness.      Objective:     BP (!) 124/50   Pulse 88   Temp 99.1 F (37.3 C) (Oral)   Ht 6' (1.829 m)   Wt 164 lb 9.6 oz (74.7 kg)   SpO2 98%   BMI 22.32 kg/m  BP Readings from Last 3 Encounters:  08/10/24 (!) 124/50  06/27/24 90/60  06/27/24 101/60   Wt Readings from Last 3 Encounters:  08/10/24 164 lb 9.6 oz (74.7 kg)  06/27/24 166 lb 2 oz (75.4 kg)  06/27/24 165 lb (74.8 kg)   SpO2 Readings from Last 3 Encounters:  08/10/24 98%  06/27/24 96%  06/26/24 97%      Physical Exam Vitals and nursing note reviewed.  Constitutional:      Appearance: Normal appearance.  Cardiovascular:     Rate and Rhythm: Normal rate and regular rhythm.     Heart sounds: Normal heart sounds.  Pulmonary:     Effort: Pulmonary effort is normal.     Breath sounds: Normal breath sounds.  Abdominal:     Palpations: Abdomen is soft.     Tenderness: There is abdominal tenderness.      Comments: No BS appreciated  Neurological:     Mental Status: He is alert.      No results found for any visits on 08/10/24.    The  ASCVD Risk score (Arnett DK, et al., 2019) failed to calculate for the following reasons:   The 2019 ASCVD risk score is only valid for ages 70 to 1   Risk score cannot be calculated because patient has a medical history suggesting prior/existing ASCVD   * - Cholesterol units were assumed    Assessment & Plan:   Problem List Items Addressed This Visit       Other   Irregular heart beat - Primary   History of the same.  Patient does have a sinus bradycardia with PACs in the past he is followed by cardiology Dr. Lonni HERO.  Patient had wide pulse variability today in office documented at 45 from a and then 88 from me irregular and borderline tachycardic on auscultation.  Pending EKG  EKG showed rate of 98 with sinus arrhythmia.  No signs of atrial fibrillation looks like borderline couplets       Relevant Orders   EKG 12-Lead   Lower abdominal pain   Patient having lower abdominal pain in the setting of no bowel movement for extended period of time.  Patient has stopped passing gas over the last 2 days and bowel sounds not appreciated on exam today.  Patient also has a history of urothelial carcinoma.  Patient needs stat CT scan abdomen pelvis with contrast to rule out small bowel obstruction or possible metastasis to the abdomen.  Also pending CBC, CMP and UA with reflex to culture if indicated.      Relevant Orders   CT ABDOMEN PELVIS W CONTRAST   CBC   Comprehensive metabolic panel with GFR   Urinalysis w microscopic + reflex cultur    Return if symptoms worsen or fail to improve, for as schdeuled .    Adina Crandall, NP  "

## 2024-08-10 NOTE — Patient Instructions (Addendum)
 Nice to see you today I will be in touch with the labs once I have  them  Follow up with me as scheduled   EKG did not show any thing newly abnormal. It did not show a fib.

## 2024-08-12 LAB — URINE CULTURE
MICRO NUMBER:: 17451708
SPECIMEN QUALITY:: ADEQUATE

## 2024-08-12 LAB — URINALYSIS W MICROSCOPIC + REFLEX CULTURE
Bacteria, UA: NONE SEEN /HPF
Bilirubin Urine: NEGATIVE
Glucose, UA: NEGATIVE
Hyaline Cast: NONE SEEN /LPF
Ketones, ur: NEGATIVE
Nitrites, Initial: NEGATIVE
RBC / HPF: >=60 /HPF — AB (ref 0–2)
Specific Gravity, Urine: 1.026 (ref 1.001–1.035)
Squamous Epithelial / HPF: NONE SEEN /HPF
WBC, UA: >=60 /HPF — AB (ref 0–5)
pH: 5.5 (ref 5.0–8.0)

## 2024-08-12 LAB — CULTURE INDICATED

## 2024-08-14 ENCOUNTER — Inpatient Hospital Stay

## 2024-08-14 ENCOUNTER — Inpatient Hospital Stay: Attending: Oncology | Admitting: Oncology

## 2024-08-14 ENCOUNTER — Encounter: Payer: Self-pay | Admitting: Oncology

## 2024-08-14 ENCOUNTER — Telehealth: Payer: Self-pay

## 2024-08-14 VITALS — BP 111/57 | HR 99 | Temp 97.0°F | Ht 72.0 in | Wt 164.0 lb

## 2024-08-14 DIAGNOSIS — R9389 Abnormal findings on diagnostic imaging of other specified body structures: Secondary | ICD-10-CM | POA: Diagnosis not present

## 2024-08-14 DIAGNOSIS — C689 Malignant neoplasm of urinary organ, unspecified: Secondary | ICD-10-CM | POA: Diagnosis not present

## 2024-08-14 DIAGNOSIS — K5903 Drug induced constipation: Secondary | ICD-10-CM

## 2024-08-14 DIAGNOSIS — G893 Neoplasm related pain (acute) (chronic): Secondary | ICD-10-CM | POA: Insufficient documentation

## 2024-08-14 DIAGNOSIS — C675 Malignant neoplasm of bladder neck: Secondary | ICD-10-CM | POA: Insufficient documentation

## 2024-08-14 DIAGNOSIS — Z79899 Other long term (current) drug therapy: Secondary | ICD-10-CM | POA: Diagnosis not present

## 2024-08-14 DIAGNOSIS — Z9221 Personal history of antineoplastic chemotherapy: Secondary | ICD-10-CM | POA: Insufficient documentation

## 2024-08-14 DIAGNOSIS — D490 Neoplasm of unspecified behavior of digestive system: Secondary | ICD-10-CM | POA: Diagnosis not present

## 2024-08-14 DIAGNOSIS — B370 Candidal stomatitis: Secondary | ICD-10-CM | POA: Insufficient documentation

## 2024-08-14 DIAGNOSIS — Z86718 Personal history of other venous thrombosis and embolism: Secondary | ICD-10-CM | POA: Insufficient documentation

## 2024-08-14 DIAGNOSIS — Z7901 Long term (current) use of anticoagulants: Secondary | ICD-10-CM | POA: Diagnosis not present

## 2024-08-14 MED ORDER — SENNOSIDES-DOCUSATE SODIUM 8.6-50 MG PO TABS: 2.0000 | ORAL_TABLET | Freq: Every day | ORAL | 3 refills | Status: AC

## 2024-08-14 MED ORDER — APIXABAN 5 MG PO TABS: 5.0000 mg | ORAL_TABLET | Freq: Two times a day (BID) | ORAL | 1 refills | Status: AC

## 2024-08-14 NOTE — Assessment & Plan Note (Signed)
 Small pancreatic lesion, likely IPMN

## 2024-08-14 NOTE — Assessment & Plan Note (Signed)
 Continue Eliquis  5 mg twice daily.  Refills were sent. Recommend patient to hold off Eliquis  2 days prior to the CT-guided biopsy of pelvic soft tissue.

## 2024-08-14 NOTE — Telephone Encounter (Signed)
 Pt will need a CT guided bx of pelvis soft tissue mass. Dr. Babara is ok to hold Eliquis  48 hour prior to procedure, if needed. Request sent to IR.   Will schedule MD follow-up 1 week after bx.

## 2024-08-14 NOTE — Assessment & Plan Note (Signed)
 Urothelial carcinoma of prostatic urethra/bladder neck, cT2, muscle invasive.  He is not a candidate for radical cystectomy.  S/p bi weekly Gemcitabine  27mg /m2 concurrently with radiation.   Recent CT abdomen pelvis with contrast showed development of pelvic soft tissue mass invading L5.  Suspicious for bladder cancer recurrence.  Patient is interested in further workup and future cancer treatments. I recommend CT-guided biopsy for further evaluation. Also recommend CT chest with contrast to complete staging.
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
## 2024-08-14 NOTE — Progress Notes (Signed)
 [name] LABOR, MD sent to [name] PROCEDURE / BIOPSY REVIEW Date: 08/14/2024  Requested Biopsy site: pelvis Reason for request: left sided soft tissue mass Imaging review: Best seen on CT  Decision: Approved Imaging modality to perform: CT Schedule with: Moderate Sedation Schedule for: [name]  Additional comments: @Schedulers .  Please contact [name] with questions, concerns, or if issue pertaining to this request arise.  Dunn LABOR [name], MD [facility]

## 2024-08-14 NOTE — Assessment & Plan Note (Signed)
 Recommend patient to start Senokot 2 tablets daily.  Add MiraLAX  daily as needed for constipation.

## 2024-08-15 NOTE — Telephone Encounter (Signed)
 patient scheduled for bx on Mon 1/19 @ 10a, arrive 9a.  Last dose of Eliquis  would be Friday 1/16 and then hold Sat and Sun. Patient and wife verbalized understaning of this.   Please schedule MD (for bx results ) on 1/29 @ 3:30. Pt aware, but would like appts mailed. Please mail appt reminders of bx date and follow-up.

## 2024-08-17 ENCOUNTER — Telehealth: Payer: Self-pay

## 2024-08-17 NOTE — Telephone Encounter (Signed)
 Patient for CT guided LT sided soft tissue mass biopsy on Mon 08/20/24, I called and spoke with the patient on the phone and gave pre-procedure instructions. Pt was made aware to be here at 9a, last dose of Eliquis  was Friday 08/17/24, NPO after MN prior to procedure as well as driver post procedure/recovery/discharge. Pt stated understanding.  Called 08/17/24

## 2024-08-20 ENCOUNTER — Ambulatory Visit
Admission: RE | Admit: 2024-08-20 | Discharge: 2024-08-20 | Disposition: A | Source: Ambulatory Visit | Attending: Oncology

## 2024-08-20 ENCOUNTER — Other Ambulatory Visit: Payer: Self-pay

## 2024-08-20 VITALS — BP 148/74 | HR 82 | Temp 97.1°F | Resp 23

## 2024-08-20 DIAGNOSIS — Z7901 Long term (current) use of anticoagulants: Secondary | ICD-10-CM | POA: Diagnosis not present

## 2024-08-20 DIAGNOSIS — R9389 Abnormal findings on diagnostic imaging of other specified body structures: Secondary | ICD-10-CM

## 2024-08-20 DIAGNOSIS — Z9221 Personal history of antineoplastic chemotherapy: Secondary | ICD-10-CM | POA: Insufficient documentation

## 2024-08-20 DIAGNOSIS — Z86718 Personal history of other venous thrombosis and embolism: Secondary | ICD-10-CM | POA: Diagnosis not present

## 2024-08-20 DIAGNOSIS — I4510 Unspecified right bundle-branch block: Secondary | ICD-10-CM | POA: Diagnosis not present

## 2024-08-20 DIAGNOSIS — Z923 Personal history of irradiation: Secondary | ICD-10-CM | POA: Diagnosis not present

## 2024-08-20 DIAGNOSIS — C7989 Secondary malignant neoplasm of other specified sites: Secondary | ICD-10-CM | POA: Diagnosis not present

## 2024-08-20 DIAGNOSIS — Z9079 Acquired absence of other genital organ(s): Secondary | ICD-10-CM | POA: Insufficient documentation

## 2024-08-20 DIAGNOSIS — Z01818 Encounter for other preprocedural examination: Secondary | ICD-10-CM

## 2024-08-20 DIAGNOSIS — I5020 Unspecified systolic (congestive) heart failure: Secondary | ICD-10-CM | POA: Diagnosis not present

## 2024-08-20 DIAGNOSIS — I110 Hypertensive heart disease with heart failure: Secondary | ICD-10-CM | POA: Insufficient documentation

## 2024-08-20 DIAGNOSIS — C678 Malignant neoplasm of overlapping sites of bladder: Secondary | ICD-10-CM | POA: Diagnosis not present

## 2024-08-20 LAB — CBC
HCT: 34.6 % — ABNORMAL LOW (ref 39.0–52.0)
Hemoglobin: 11 g/dL — ABNORMAL LOW (ref 13.0–17.0)
MCH: 28.5 pg (ref 26.0–34.0)
MCHC: 31.8 g/dL (ref 30.0–36.0)
MCV: 89.6 fL (ref 80.0–100.0)
Platelets: 364 K/uL (ref 150–400)
RBC: 3.86 MIL/uL — ABNORMAL LOW (ref 4.22–5.81)
RDW: 15.4 % (ref 11.5–15.5)
WBC: 9.1 K/uL (ref 4.0–10.5)
nRBC: 0 % (ref 0.0–0.2)

## 2024-08-20 LAB — PROTIME-INR
INR: 1.2 (ref 0.8–1.2)
Prothrombin Time: 16.2 s — ABNORMAL HIGH (ref 11.4–15.2)

## 2024-08-20 MED ORDER — BACITRACIN-NEOMYCIN-POLYMYXIN OINTMENT TUBE
1.0000 | TOPICAL_OINTMENT | Freq: Once | CUTANEOUS | Status: AC
  Administered 2024-08-20: 1 via TOPICAL
  Filled 2024-08-20 (×2): qty 14.17

## 2024-08-20 MED ORDER — FENTANYL CITRATE (PF) 100 MCG/2ML IJ SOLN
INTRAMUSCULAR | Status: AC
  Filled 2024-08-20: qty 2

## 2024-08-20 MED ORDER — MIDAZOLAM HCL (PF) 2 MG/2ML IJ SOLN
INTRAMUSCULAR | Status: AC | PRN
  Administered 2024-08-20: 1 mg via INTRAVENOUS

## 2024-08-20 MED ORDER — HYDROCODONE-ACETAMINOPHEN 5-325 MG PO TABS: 1.0000 | ORAL_TABLET | ORAL | Status: DC | PRN

## 2024-08-20 MED ORDER — FENTANYL CITRATE (PF) 100 MCG/2ML IJ SOLN
INTRAMUSCULAR | Status: AC | PRN
  Administered 2024-08-20: 25 ug via INTRAVENOUS
  Administered 2024-08-20: 50 ug via INTRAVENOUS

## 2024-08-20 MED ORDER — LIDOCAINE 1 % OPTIME INJ - NO CHARGE
10.0000 mL | Freq: Once | INTRAMUSCULAR | Status: AC
  Administered 2024-08-20: 10 mL
  Filled 2024-08-20: qty 10

## 2024-08-20 MED ORDER — SODIUM CHLORIDE 0.9 % IV SOLN: INTRAVENOUS | Status: DC

## 2024-08-20 MED ORDER — MIDAZOLAM HCL 2 MG/2ML IJ SOLN
INTRAMUSCULAR | Status: AC
  Filled 2024-08-20: qty 2

## 2024-08-20 NOTE — Progress Notes (Signed)
 Patient clinically stable post CT abdominal mass biopsy per Dr Johann, tolerated well. Vitals stable post procedure. Received Versed  1 mg along with Fentanyl  75 mcg IV for procedure. Report given to Josette Peak post procedure/specials/23

## 2024-08-20 NOTE — Procedures (Signed)
" °  Procedure:  CT core biopsy L pelvic mass 18g x4 Preprocedure diagnosis: The primary encounter diagnosis was Pre-op exam. A diagnosis of Abnormal CT scan was also pertinent to this visit. Bladder neoplasm. Postprocedure diagnosis: same EBL:    minimal Complications:   none immediate  See full dictation in [employer].  CHARM Toribio Faes MD Main # [phone] Pager  [phone] Mobile [phone]    "
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
## 2024-08-21 LAB — SURGICAL PATHOLOGY

## 2024-08-30 ENCOUNTER — Encounter: Payer: Self-pay | Admitting: Oncology

## 2024-08-30 ENCOUNTER — Inpatient Hospital Stay: Admitting: Oncology

## 2024-08-30 VITALS — BP 139/75 | HR 56 | Temp 97.5°F | Resp 18 | Wt 161.7 lb

## 2024-08-30 DIAGNOSIS — C675 Malignant neoplasm of bladder neck: Secondary | ICD-10-CM | POA: Diagnosis not present

## 2024-08-30 DIAGNOSIS — B370 Candidal stomatitis: Secondary | ICD-10-CM

## 2024-08-30 DIAGNOSIS — R9389 Abnormal findings on diagnostic imaging of other specified body structures: Secondary | ICD-10-CM | POA: Diagnosis not present

## 2024-08-30 DIAGNOSIS — Z86718 Personal history of other venous thrombosis and embolism: Secondary | ICD-10-CM | POA: Diagnosis not present

## 2024-08-30 DIAGNOSIS — C689 Malignant neoplasm of urinary organ, unspecified: Secondary | ICD-10-CM | POA: Diagnosis not present

## 2024-08-30 DIAGNOSIS — R634 Abnormal weight loss: Secondary | ICD-10-CM

## 2024-08-30 DIAGNOSIS — G893 Neoplasm related pain (acute) (chronic): Secondary | ICD-10-CM | POA: Diagnosis not present

## 2024-08-30 MED ORDER — GABAPENTIN 100 MG PO CAPS: 100.0000 mg | ORAL_CAPSULE | Freq: Two times a day (BID) | ORAL | 0 refills | Status: AC

## 2024-08-30 MED ORDER — TRAMADOL HCL 50 MG PO TABS: 50.0000 mg | ORAL_TABLET | Freq: Two times a day (BID) | ORAL | 0 refills | Status: AC | PRN

## 2024-08-30 NOTE — Assessment & Plan Note (Signed)
 Urothelial carcinoma of prostatic urethra/bladder neck, cT2, muscle invasive.  He is not a candidate for radical cystectomy.  S/p bi weekly Gemcitabine  27mg /m2 concurrently with radiation.   Recent CT abdomen pelvis with contrast showed development of pelvic soft tissue mass invading L5.  Suspicious for bladder cancer recurrence.  CT-guided biopsy pathology confirmed poorly differentiated carcinoma, consistent with urothelial carcinoma.   Check CT chest with contrast to complete staging. Discussed with patient and daughter regarding treatment options. he has borderline performance status, good family support.  If he is interested in treatments, could consider Padcev plus Keytruda.  Immunotherapy monotherapy could also be considered.  Patient and potential side effects were reviewed and discussed patient. I will obtain NGS molecular studies. Alternative option of pursuing with hospice.  He also is undecided and would like to further discuss with family members.

## 2024-08-30 NOTE — Assessment & Plan Note (Signed)
Continue Eliquis 5 mg twice daily.  Refills were sent.

## 2024-08-30 NOTE — Progress Notes (Signed)
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 Normal prostate. No acute abnormality. BONES AND SOFT TISSUES: Tiny fat containing right inguinal hernia. Degenerative changes of both hips. Advanced lumbosacral spondylosis. IMPRESSION: 1. Large stool ball in the rectum, suggesting constipation and fecal impaction.no other explanation for pain. 2. Development of anecrotic posterior pelvic soft tissue mass with direct involvement of the L5 vertebral body and sacrum. Most consistent with metastasis . 3. No change in left renal 8 mm lesion which is of doubtful clinical significance given comorbidities. 4. Esophageal fluid level suggests dysmotility and/or gastroesophageal reflux. 5. Aortic atherosclerosis ([id number]). 6. 9 mm cystic pancreatic uncinate process lesion is also of doubtful clinical significance. . most likely an indolent cystic neoplasm or pseudocyst. 7. These results will be called to the ordering clinician or representative by the radiologist assistant, and communication documented in the pacs or clario dashboard.  Electronically signed by: Rockey Kilts MD MD 08/10/2024 10:52 AM EST RP Workstation: [id number]   DG Tibia/Fibula Left Result Date: 06/16/2024 CLINICAL DATA:  Left leg pain, status post fall 2 months ago. EXAM: LEFT TIBIA AND FIBULA - 2 VIEW COMPARISON:  Knee radiograph 11/12/2016 FINDINGS: No acute or evidence of healing/healed fracture. Knee arthroplasty is intact were visualized. There are ghost tracks in the tibial shaft from prior surgery. Ankle alignment is maintained. No erosive or bony destructive change. Unremarkable soft tissues. IMPRESSION: 1. No acute or evidence of healing/healed fracture. 2. Knee arthroplasty without complication. Electronically Signed   By: Andrea Gasman M.D.   On: 06/16/2024 16:51   DG FEMUR MIN 2 VIEWS LEFT Result Date: 06/16/2024 CLINICAL DATA:  Left leg pain.  Fall 2 months ago. EXAM: LEFT FEMUR 2 VIEWS COMPARISON:  None Available. FINDINGS: No evidence of acute or healing/healed fracture. Knee replacement is intact were visualized. Moderate osteoarthritis of the left hip. No erosions or periostitis. Vascular calcifications are seen. Hamstrings enthesopathy. IMPRESSION: 1. No acute or healing/healed fracture of the left femur. 2. Moderate osteoarthritis of the left hip. Electronically Signed   By: Andrea Gasman M.D.   On: 06/16/2024 16:51    "

## 2024-08-30 NOTE — Assessment & Plan Note (Signed)
 Recommend nystatin  oral rinse

## 2024-08-30 NOTE — Assessment & Plan Note (Addendum)
 Recommend gabapentin  100 mg, to start with daily for 5 to 7 days and then increase to 2 times daily. Recommend tramadol  50 mg every 12 hours twice daily as needed. Referred to palliative care service Refer to radiation oncology

## 2024-08-30 NOTE — Assessment & Plan Note (Signed)
Refer to nutritionist 

## 2024-08-31 ENCOUNTER — Telehealth: Payer: Self-pay

## 2024-08-31 ENCOUNTER — Encounter: Payer: Self-pay | Admitting: Oncology

## 2024-08-31 ENCOUNTER — Telehealth: Payer: Self-pay | Admitting: Oncology

## 2024-08-31 DIAGNOSIS — C689 Malignant neoplasm of urinary organ, unspecified: Secondary | ICD-10-CM

## 2024-08-31 DIAGNOSIS — G893 Neoplasm related pain (acute) (chronic): Secondary | ICD-10-CM

## 2024-08-31 NOTE — Telephone Encounter (Signed)
 Referral for Rad onc placed:   Re: pain r/t urothelial carcinoma.

## 2024-08-31 NOTE — Telephone Encounter (Signed)
 [facility] requested on pancral mass specimen [id number] collected 08/20/24.    Financial application was submitted and approved. Pt's out of-of-pocket cost will be $0.

## 2024-08-31 NOTE — Telephone Encounter (Signed)
 Called pt spouse to sched CT - r/s other appts to minimize trips to clinic for pt since they live out of town - [facility]

## 2024-08-31 NOTE — Telephone Encounter (Signed)
-----   Message from Zelphia Cap, MD sent at 08/30/2024 10:07 PM EST ----- Please refer patient to radiation oncology thank you

## 2024-09-04 ENCOUNTER — Inpatient Hospital Stay: Admitting: Hospice and Palliative Medicine

## 2024-09-05 ENCOUNTER — Encounter: Payer: Self-pay | Admitting: Radiation Oncology

## 2024-09-05 ENCOUNTER — Ambulatory Visit: Admission: RE | Admit: 2024-09-05 | Discharge: 2024-09-05 | Attending: Oncology

## 2024-09-05 ENCOUNTER — Ambulatory Visit
Admission: RE | Admit: 2024-09-05 | Discharge: 2024-09-05 | Attending: Radiation Oncology | Admitting: Radiation Oncology

## 2024-09-05 VITALS — BP 102/65 | HR 59 | Temp 97.7°F

## 2024-09-05 DIAGNOSIS — R9389 Abnormal findings on diagnostic imaging of other specified body structures: Secondary | ICD-10-CM

## 2024-09-05 DIAGNOSIS — C689 Malignant neoplasm of urinary organ, unspecified: Secondary | ICD-10-CM

## 2024-09-05 DIAGNOSIS — C679 Malignant neoplasm of bladder, unspecified: Secondary | ICD-10-CM

## 2024-09-05 MED ORDER — IOHEXOL 300 MG/ML  SOLN
75.0000 mL | Freq: Once | INTRAMUSCULAR | Status: AC | PRN
  Administered 2024-09-05: 75 mL via INTRAVENOUS

## 2024-09-05 NOTE — Progress Notes (Signed)
 Radiation Oncology Follow up Note old patient new area L5 metastasis  Name: [name]   Date:   09/05/2024 MRN:  [id number] DOB: 18-Feb-1935    This 89 y.o. 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  Patient has done well clinically although recently has developed back pain causing difficulty with ambulation.  CT scan of his abdomen showed an endorectal posterior pelvic soft tissue mass with a rectal bowel and L5 vertebral body and sacrum consistent with metastasis.  Patient underwent biopsy which was positive for poorly differentiated carcinoma with immunohistochemistry positive for cytokeratin and GA TA 3 consistent with urethral carcinoma.  His wife states that he is falling upon ambulation.  He does have some decree strength in his left lower extremity.  I have been asked to evaluate him for palliative radiation therapy to this area.  COMPLICATIONS OF TREATMENT: none  FOLLOW UP COMPLIANCE: keeps appointments   PHYSICAL EXAM:  BP 102/65   Pulse (!) 59   Temp 97.7 F (36.5 C) (Tympanic)  Some subtle decrease strength in his left lower extremity proprioception appears intact sensory levels appear intact.  Patient does have tenderness to his lower lumbar spine.  Well-developed well-nourished patient in NAD. HEENT reveals PERLA, EOMI, discs not visualized.  Oral cavity is clear. No oral mucosal lesions are identified. Neck is clear without evidence of cervical or supraclavicular adenopathy. Lungs are clear to A&P. Cardiac examination is essentially unremarkable with regular rate  and rhythm without murmur rub or thrill. Abdomen is benign with no organomegaly or masses noted. Motor sensory and DTR levels are equal and symmetric in the upper and lower extremities. Cranial nerves II through XII are grossly intact. Proprioception is intact. No peripheral adenopathy or edema is identified. No motor or sensory levels are noted. Crude visual fields are within normal range.  RADIOLOGY RESULTS: CT scans reviewed compatible with above-stated findings  PLAN: At this time elected ahead with palliative radiation therapy and hypofractionated course of treatment.  Would plan on delivering 30 Gray in 5 fractions using IMRT treatment planning and delivery.  Risks and benefits of treatment including possible GI disturbance possible skin reaction and fatigue were reviewed with the patient and his wife.  I have personally set up and ordered CT simulation.  I believe treatments will have very little side effects using the IMRT technique.  This will also help me avoid previously radiated fields.  I would like to take this opportunity to thank you for allowing me to participate in the care of your patient.SABRA Marcey Penton, MD

## 2024-09-06 ENCOUNTER — Ambulatory Visit
Admission: RE | Admit: 2024-09-06 | Discharge: 2024-09-06 | Attending: Radiation Oncology | Admitting: Radiation Oncology

## 2024-09-07 ENCOUNTER — Encounter: Payer: Self-pay | Admitting: Nurse Practitioner

## 2024-09-11 ENCOUNTER — Inpatient Hospital Stay: Attending: Oncology

## 2024-09-11 ENCOUNTER — Inpatient Hospital Stay: Admitting: Hospice and Palliative Medicine

## 2024-09-13 ENCOUNTER — Ambulatory Visit

## 2024-09-17 ENCOUNTER — Ambulatory Visit

## 2024-09-18 ENCOUNTER — Ambulatory Visit

## 2024-09-19 ENCOUNTER — Ambulatory Visit

## 2024-09-20 ENCOUNTER — Ambulatory Visit

## 2024-09-21 ENCOUNTER — Ambulatory Visit

## 2024-09-28 ENCOUNTER — Other Ambulatory Visit: Admitting: Urology

## 2024-11-16 ENCOUNTER — Encounter: Admitting: Nurse Practitioner

## 2025-01-09 ENCOUNTER — Other Ambulatory Visit

## 2025-01-31 ENCOUNTER — Inpatient Hospital Stay

## 2025-01-31 ENCOUNTER — Inpatient Hospital Stay: Admitting: Oncology
